# Patient Record
Sex: Female | Born: 1970 | Race: White | Hispanic: No | Marital: Single | Smoking: Former smoker
Health system: Southern US, Community
[De-identification: ages and names within clinical notes are randomized; demographics above are authoritative.]

## PROBLEM LIST (undated history)

## (undated) DIAGNOSIS — M2550 Pain in unspecified joint: Secondary | ICD-10-CM

## (undated) DIAGNOSIS — F50819 Binge eating disorder, unspecified: Secondary | ICD-10-CM

## (undated) DIAGNOSIS — M1990 Unspecified osteoarthritis, unspecified site: Secondary | ICD-10-CM

## (undated) DIAGNOSIS — R87619 Unspecified abnormal cytological findings in specimens from cervix uteri: Secondary | ICD-10-CM

## (undated) DIAGNOSIS — I10 Essential (primary) hypertension: Secondary | ICD-10-CM

## (undated) DIAGNOSIS — K439 Ventral hernia without obstruction or gangrene: Secondary | ICD-10-CM

## (undated) DIAGNOSIS — K603 Anal fistula: Secondary | ICD-10-CM

## (undated) DIAGNOSIS — N393 Stress incontinence (female) (male): Secondary | ICD-10-CM

## (undated) DIAGNOSIS — E782 Mixed hyperlipidemia: Secondary | ICD-10-CM

## (undated) DIAGNOSIS — G4733 Obstructive sleep apnea (adult) (pediatric): Secondary | ICD-10-CM

## (undated) DIAGNOSIS — Z9221 Personal history of antineoplastic chemotherapy: Secondary | ICD-10-CM

## (undated) DIAGNOSIS — E538 Deficiency of other specified B group vitamins: Secondary | ICD-10-CM

## (undated) DIAGNOSIS — G43909 Migraine, unspecified, not intractable, without status migrainosus: Secondary | ICD-10-CM

## (undated) DIAGNOSIS — Z91018 Allergy to other foods: Secondary | ICD-10-CM

## (undated) DIAGNOSIS — F319 Bipolar disorder, unspecified: Secondary | ICD-10-CM

## (undated) DIAGNOSIS — F988 Other specified behavioral and emotional disorders with onset usually occurring in childhood and adolescence: Secondary | ICD-10-CM

## (undated) DIAGNOSIS — N809 Endometriosis, unspecified: Secondary | ICD-10-CM

## (undated) DIAGNOSIS — R5383 Other fatigue: Secondary | ICD-10-CM

## (undated) DIAGNOSIS — K649 Unspecified hemorrhoids: Secondary | ICD-10-CM

## (undated) DIAGNOSIS — A64 Unspecified sexually transmitted disease: Secondary | ICD-10-CM

## (undated) DIAGNOSIS — E559 Vitamin D deficiency, unspecified: Secondary | ICD-10-CM

## (undated) DIAGNOSIS — R911 Solitary pulmonary nodule: Secondary | ICD-10-CM

## (undated) DIAGNOSIS — F5081 Binge eating disorder: Secondary | ICD-10-CM

## (undated) DIAGNOSIS — M7989 Other specified soft tissue disorders: Secondary | ICD-10-CM

## (undated) DIAGNOSIS — F411 Generalized anxiety disorder: Secondary | ICD-10-CM

## (undated) DIAGNOSIS — C539 Malignant neoplasm of cervix uteri, unspecified: Secondary | ICD-10-CM

## (undated) DIAGNOSIS — K580 Irritable bowel syndrome with diarrhea: Secondary | ICD-10-CM

## (undated) DIAGNOSIS — Z86018 Personal history of other benign neoplasm: Secondary | ICD-10-CM

## (undated) DIAGNOSIS — K589 Irritable bowel syndrome without diarrhea: Secondary | ICD-10-CM

## (undated) DIAGNOSIS — K529 Noninfective gastroenteritis and colitis, unspecified: Secondary | ICD-10-CM

## (undated) DIAGNOSIS — D219 Benign neoplasm of connective and other soft tissue, unspecified: Secondary | ICD-10-CM

## (undated) DIAGNOSIS — K760 Fatty (change of) liver, not elsewhere classified: Secondary | ICD-10-CM

## (undated) DIAGNOSIS — M549 Dorsalgia, unspecified: Secondary | ICD-10-CM

## (undated) DIAGNOSIS — E739 Lactose intolerance, unspecified: Secondary | ICD-10-CM

## (undated) DIAGNOSIS — F909 Attention-deficit hyperactivity disorder, unspecified type: Secondary | ICD-10-CM

## (undated) DIAGNOSIS — E785 Hyperlipidemia, unspecified: Secondary | ICD-10-CM

## (undated) DIAGNOSIS — Z8719 Personal history of other diseases of the digestive system: Secondary | ICD-10-CM

## (undated) DIAGNOSIS — R0602 Shortness of breath: Secondary | ICD-10-CM

## (undated) DIAGNOSIS — R32 Unspecified urinary incontinence: Secondary | ICD-10-CM

## (undated) DIAGNOSIS — K219 Gastro-esophageal reflux disease without esophagitis: Secondary | ICD-10-CM

## (undated) DIAGNOSIS — Z9989 Dependence on other enabling machines and devices: Secondary | ICD-10-CM

## (undated) DIAGNOSIS — F419 Anxiety disorder, unspecified: Secondary | ICD-10-CM

## (undated) DIAGNOSIS — F329 Major depressive disorder, single episode, unspecified: Secondary | ICD-10-CM

## (undated) DIAGNOSIS — E7800 Pure hypercholesterolemia, unspecified: Secondary | ICD-10-CM

## (undated) DIAGNOSIS — Z923 Personal history of irradiation: Secondary | ICD-10-CM

## (undated) DIAGNOSIS — R7303 Prediabetes: Secondary | ICD-10-CM

## (undated) HISTORY — DX: Pain in unspecified joint: M25.50

## (undated) HISTORY — DX: Attention-deficit hyperactivity disorder, unspecified type: F90.9

## (undated) HISTORY — PX: OTHER SURGICAL HISTORY: SHX169

## (undated) HISTORY — DX: Shortness of breath: R06.02

## (undated) HISTORY — PX: EXCISION OF BREAST BIOPSY: SHX5822

## (undated) HISTORY — DX: Other specified soft tissue disorders: M79.89

## (undated) HISTORY — DX: Pure hypercholesterolemia, unspecified: E78.00

## (undated) HISTORY — DX: Bipolar disorder, unspecified: F31.9

## (undated) HISTORY — PX: HERNIA REPAIR: SHX51

## (undated) HISTORY — PX: COLONOSCOPY: SHX174

## (undated) HISTORY — DX: Prediabetes: R73.03

## (undated) HISTORY — DX: Deficiency of other specified B group vitamins: E53.8

## (undated) HISTORY — PX: COLPOSCOPY: SHX161

## (undated) HISTORY — PX: WISDOM TOOTH EXTRACTION: SHX21

## (undated) HISTORY — DX: Essential (primary) hypertension: I10

## (undated) HISTORY — DX: Other specified behavioral and emotional disorders with onset usually occurring in childhood and adolescence: F98.8

## (undated) HISTORY — DX: Unspecified sexually transmitted disease: A64

## (undated) HISTORY — DX: Vitamin D deficiency, unspecified: E55.9

## (undated) HISTORY — DX: Fatty (change of) liver, not elsewhere classified: K76.0

## (undated) HISTORY — DX: Endometriosis, unspecified: N80.9

## (undated) HISTORY — DX: Dorsalgia, unspecified: M54.9

## (undated) HISTORY — DX: Unspecified urinary incontinence: R32

## (undated) HISTORY — DX: Allergy to other foods: Z91.018

## (undated) HISTORY — DX: Lactose intolerance, unspecified: E73.9

## (undated) HISTORY — DX: Benign neoplasm of connective and other soft tissue, unspecified: D21.9

## (undated) HISTORY — DX: Unspecified abnormal cytological findings in specimens from cervix uteri: R87.619

## (undated) HISTORY — PX: TOTAL ABDOMINAL HYSTERECTOMY W/ BILATERAL SALPINGOOPHORECTOMY: SHX83

---

## 2008-11-25 HISTORY — PX: BREAST SURGERY: SHX581

## 2010-08-31 DIAGNOSIS — D249 Benign neoplasm of unspecified breast: Secondary | ICD-10-CM | POA: Insufficient documentation

## 2011-10-07 DIAGNOSIS — G43909 Migraine, unspecified, not intractable, without status migrainosus: Secondary | ICD-10-CM | POA: Insufficient documentation

## 2011-10-07 DIAGNOSIS — N943 Premenstrual tension syndrome: Secondary | ICD-10-CM | POA: Insufficient documentation

## 2011-11-28 DIAGNOSIS — E785 Hyperlipidemia, unspecified: Secondary | ICD-10-CM | POA: Insufficient documentation

## 2012-05-16 DIAGNOSIS — R87619 Unspecified abnormal cytological findings in specimens from cervix uteri: Secondary | ICD-10-CM

## 2012-05-16 HISTORY — DX: Unspecified abnormal cytological findings in specimens from cervix uteri: R87.619

## 2013-04-15 DIAGNOSIS — Z8541 Personal history of malignant neoplasm of cervix uteri: Secondary | ICD-10-CM

## 2013-04-15 HISTORY — DX: Personal history of malignant neoplasm of cervix uteri: Z85.41

## 2013-05-16 DIAGNOSIS — C539 Malignant neoplasm of cervix uteri, unspecified: Secondary | ICD-10-CM

## 2013-05-16 HISTORY — DX: Malignant neoplasm of cervix uteri, unspecified: C53.9

## 2013-05-31 DIAGNOSIS — C539 Malignant neoplasm of cervix uteri, unspecified: Secondary | ICD-10-CM | POA: Insufficient documentation

## 2013-06-11 HISTORY — PX: TOTAL ABDOMINAL HYSTERECTOMY W/ BILATERAL SALPINGOOPHORECTOMY: SHX83

## 2013-06-11 HISTORY — PX: ABDOMINAL HYSTERECTOMY: SHX81

## 2013-12-12 ENCOUNTER — Encounter: Payer: BC Managed Care – PPO | Attending: Specialist | Admitting: *Deleted

## 2013-12-12 ENCOUNTER — Encounter: Payer: Self-pay | Admitting: *Deleted

## 2013-12-12 VITALS — Ht 64.0 in | Wt 202.5 lb

## 2013-12-12 DIAGNOSIS — K432 Incisional hernia without obstruction or gangrene: Secondary | ICD-10-CM | POA: Insufficient documentation

## 2013-12-12 DIAGNOSIS — E669 Obesity, unspecified: Secondary | ICD-10-CM | POA: Diagnosis present

## 2013-12-12 DIAGNOSIS — Z713 Dietary counseling and surveillance: Secondary | ICD-10-CM | POA: Diagnosis present

## 2013-12-12 DIAGNOSIS — Z6834 Body mass index (BMI) 34.0-34.9, adult: Secondary | ICD-10-CM | POA: Diagnosis not present

## 2013-12-12 NOTE — Progress Notes (Signed)
Medical Nutrition Therapy:  Appt start time: 1500 end time:  1600.  Assessment:  Patient here today for weight management counseling. Patient reports that she has gained about 40 pounds in the last 5 years and has not been able to lose any weight. She is using My Fitness Pal to track diet, and is fairly active, walking most days and swimming several times weekly. She has tried Weight Watchers and other supervised weight loss programs, but these have not worked. She has a history of cervical cancer with hysterectomy. She was recently tested for food allergies, and found to be allergic and/or intolerant to many foods, including dairy, gluten, nuts, eggs, corn, as well as other fruits, vegetables, and seasonings. She has a history of IBS. Patient verbalizes frustration with inability to lose weight. We discussed readjusting expectations for weight loss in order to celebrate any progress and not get discouraged.   MEDICATIONS: See list   DIETARY INTAKE:   Usual eating pattern includes 3 meals and 1-2 snacks per day.  24-hr recall:  B ( AM): Protein shake (NP [employer]), coconut milk, coconut oil, fruit, flaxseed  Unable to complete full diet assessment due to complex discussion of food allergies  Usual physical activity: Walking daily, swimming 1-2 times weekly  Estimated energy needs: 1200 calories 150 g carbohydrates 75 g protein 40 g fat  Progress Towards Goal(s):  In progress.   Nutritional Diagnosis:  [place]-3.3 Overweight/obesity As related to multiple medical problems.  As evidenced by BMI >30.    Intervention:  Nutrition counseling. We discussed strategies for weight loss, including balancing nutrients (carbs, protein, fat), portion control, healthy snacks, and exercise. We also discussed her food allergies and food options.   Goals:  1. 0.5-1 pound weight loss per week.  2. Eliminate food allergens from diet. 3. Balance nutrients at meals (1-2 starches, 3 oz protein, 1 cup  vegetables) 4. Choose healthy snacks (fruit, vegetables, bean dip, soy yogurt, edamame)  Handouts given during visit include:  Weight loss tips  Monitoring/Evaluation:  Dietary intake, exercise, and body weight in 6 week(s).

## 2014-01-10 HISTORY — PX: OTHER SURGICAL HISTORY: SHX169

## 2014-01-11 DIAGNOSIS — Z8719 Personal history of other diseases of the digestive system: Secondary | ICD-10-CM | POA: Insufficient documentation

## 2014-02-24 ENCOUNTER — Other Ambulatory Visit: Payer: Self-pay | Admitting: Internal Medicine

## 2014-02-24 ENCOUNTER — Ambulatory Visit
Admission: RE | Admit: 2014-02-24 | Discharge: 2014-02-24 | Disposition: A | Discharge status: Home / Self Care | Payer: BC Managed Care – PPO | Source: Ambulatory Visit | Attending: Internal Medicine | Admitting: Internal Medicine

## 2014-02-24 DIAGNOSIS — S022XXA Fracture of nasal bones, initial encounter for closed fracture: Secondary | ICD-10-CM

## 2014-10-17 ENCOUNTER — Encounter (HOSPITAL_COMMUNITY): Payer: Self-pay | Admitting: *Deleted

## 2014-10-17 ENCOUNTER — Emergency Department (HOSPITAL_COMMUNITY)
Admission: EM | Admit: 2014-10-17 | Discharge: 2014-10-17 | Disposition: A | Discharge status: Home / Self Care | Payer: BLUE CROSS/BLUE SHIELD | Attending: Emergency Medicine | Admitting: Emergency Medicine

## 2014-10-17 DIAGNOSIS — Z8639 Personal history of other endocrine, nutritional and metabolic disease: Secondary | ICD-10-CM | POA: Insufficient documentation

## 2014-10-17 DIAGNOSIS — Z880 Allergy status to penicillin: Secondary | ICD-10-CM | POA: Insufficient documentation

## 2014-10-17 DIAGNOSIS — Z8541 Personal history of malignant neoplasm of cervix uteri: Secondary | ICD-10-CM | POA: Diagnosis not present

## 2014-10-17 DIAGNOSIS — K644 Residual hemorrhoidal skin tags: Secondary | ICD-10-CM | POA: Diagnosis not present

## 2014-10-17 DIAGNOSIS — Z79899 Other long term (current) drug therapy: Secondary | ICD-10-CM | POA: Insufficient documentation

## 2014-10-17 DIAGNOSIS — K6289 Other specified diseases of anus and rectum: Secondary | ICD-10-CM | POA: Diagnosis present

## 2014-10-17 HISTORY — DX: Malignant neoplasm of cervix uteri, unspecified: C53.9

## 2014-10-17 LAB — CBC WITH DIFFERENTIAL/PLATELET
BASOS ABS: 0 10*3/uL (ref 0.0–0.1)
Basophils Relative: 0 % (ref 0–1)
EOS ABS: 0.1 10*3/uL (ref 0.0–0.7)
Eosinophils Relative: 1 % (ref 0–5)
HEMATOCRIT: 38.7 % (ref 36.0–46.0)
HEMOGLOBIN: 13.1 g/dL (ref 12.0–15.0)
Lymphocytes Relative: 13 % (ref 12–46)
Lymphs Abs: 1 10*3/uL (ref 0.7–4.0)
MCH: 30 pg (ref 26.0–34.0)
MCHC: 33.9 g/dL (ref 30.0–36.0)
MCV: 88.8 fL (ref 78.0–100.0)
MONO ABS: 0.9 10*3/uL (ref 0.1–1.0)
MONOS PCT: 11 % (ref 3–12)
Neutro Abs: 5.9 10*3/uL (ref 1.7–7.7)
Neutrophils Relative %: 75 % (ref 43–77)
Platelets: 225 10*3/uL (ref 150–400)
RBC: 4.36 MIL/uL (ref 3.87–5.11)
RDW: 13.4 % (ref 11.5–15.5)
WBC: 8 10*3/uL (ref 4.0–10.5)

## 2014-10-17 MED ORDER — METFORMIN HCL 500 MG PO TABS: 500.0000 mg | ORAL_TABLET | Freq: Every day | ORAL | Status: DC

## 2014-10-17 MED ORDER — HYDROCORTISONE 2.5 % RE CREA: TOPICAL_CREAM | Freq: Two times a day (BID) | RECTAL | Status: DC

## 2014-10-17 MED ORDER — HYDROCORTISONE 2.5 % RE CREA
TOPICAL_CREAM | Freq: Two times a day (BID) | RECTAL | Status: DC
  Administered 2014-10-17: 1 via RECTAL
  Filled 2014-10-17: qty 28.35

## 2014-10-17 NOTE — ED Notes (Signed)
Pt states she has hemorrhoids she noticed Wednesday.  States she is bleeding frank blood in toilet and medium blood on toilet paper.  Pt states she is having pain 6/10.  She states she can't sit down and hemorrhoid has grown 3 sizes since Wendsday.  Vital stable.

## 2014-10-17 NOTE — Discharge Instructions (Signed)
- Use Anusol cream twice a day - You can get a donut cushion at drug stores such as [facility], [facility] or [facility] - Referral placed to general surgery. You should hear from their office within the next week.  - Use stool softeners such as Miralax or Dulcolax    Hemorrhoids Hemorrhoids are swollen veins around the rectum or anus. There are two types of hemorrhoids:   Internal hemorrhoids. These occur in the veins just inside the rectum. They may poke through to the outside and become irritated and painful.  External hemorrhoids. These occur in the veins outside the anus and can be felt as a painful swelling or hard lump near the anus. CAUSES  Pregnancy.   Obesity.   Constipation or diarrhea.   Straining to have a bowel movement.   Sitting for long periods on the toilet.  Heavy lifting or other activity that caused you to strain.  Anal intercourse. SYMPTOMS   Pain.   Anal itching or irritation.   Rectal bleeding.   Fecal leakage.   Anal swelling.   One or more lumps around the anus.  DIAGNOSIS  Your caregiver may be able to diagnose hemorrhoids by visual examination. Other examinations or tests that may be performed include:   Examination of the rectal area with a gloved hand (digital rectal exam).   Examination of anal canal using a small tube (scope).   A blood test if you have lost a significant amount of blood.  A test to look inside the colon (sigmoidoscopy or colonoscopy). TREATMENT Most hemorrhoids can be treated at home. However, if symptoms do not seem to be getting better or if you have a lot of rectal bleeding, your caregiver may perform a procedure to help make the hemorrhoids get smaller or remove them completely. Possible treatments include:   Placing a rubber band at the base of the hemorrhoid to cut off the circulation (rubber band ligation).   Injecting a chemical to shrink the hemorrhoid (sclerotherapy).   Using a tool to burn the  hemorrhoid (infrared light therapy).   Surgically removing the hemorrhoid (hemorrhoidectomy).   Stapling the hemorrhoid to block blood flow to the tissue (hemorrhoid stapling).  HOME CARE INSTRUCTIONS   Eat foods with fiber, such as whole grains, beans, nuts, fruits, and vegetables. Ask your doctor about taking products with added fiber in them (fibersupplements).  Increase fluid intake. Drink enough water and fluids to keep your urine clear or pale yellow.   Exercise regularly.   Go to the bathroom when you have the urge to have a bowel movement. Do not wait.   Avoid straining to have bowel movements.   Keep the anal area dry and clean. Use wet toilet paper or moist towelettes after a bowel movement.   Medicated creams and suppositories may be used or applied as directed.   Only take over-the-counter or prescription medicines as directed by your caregiver.   Take warm sitz baths for 15-20 minutes, 3-4 times a day to ease pain and discomfort.   Place ice packs on the hemorrhoids if they are tender and swollen. Using ice packs between sitz baths may be helpful.   Put ice in a plastic bag.   Place a towel between your skin and the bag.   Leave the ice on for 15-20 minutes, 3-4 times a day.   Do not use a donut-shaped pillow or sit on the toilet for long periods. This increases blood pooling and pain.  SEEK MEDICAL CARE IF:  You have increasing pain and swelling that is not controlled by treatment or medicine.  You have uncontrolled bleeding.  You have difficulty or you are unable to have a bowel movement.  You have pain or inflammation outside the area of the hemorrhoids. MAKE SURE YOU:  Understand these instructions.  Will watch your condition.  Will get help right away if you are not doing well or get worse. Document Released: 04/29/2000 Document Revised: 04/18/2012 Document Reviewed: 03/06/2012 [facility] Patient Information 2015 [place], [place].  This information is not intended to replace advice given to you by your health care provider. Make sure you discuss any questions you have with your health care provider.

## 2014-10-17 NOTE — ED Provider Notes (Signed)
CSN: [id number]     Arrival date & time 10/17/14  0808 History   First MD Initiated Contact with Patient 10/17/14 0818     Chief Complaint  Patient presents with  . Hemorrhoids   HPI Ms. Mast is a 44yo woman with PMHx of PCOS, cervical cancer s/p radial hysterectomy, radiation, and chemo, and external hemorrhoids who presents to the ED with rectal pain. Patient states she has noticed for the last week increased pain in her rectum and that her external hemorrhoid has grown larger. She reports bright red blood in her stool and with wiping. She also notes seeing dark tarry stool a few times last week. She notes increased BMs since she was recently started on Metformin and Cymbalta. She reports she has been unable to sit down due to the pain. She reports feeling dizzy and lightheaded but that this has been ongoing for several months. She denies abdominal pain, nausea, and vomiting. She reports only taking excedrin for migraines, denies excessive NSAID use.    Past Medical History  Diagnosis Date  . Cervical cancer 2015   History reviewed. No pertinent past surgical history. No family history on file. History  Substance Use Topics  . Smoking status: Not on file  . Smokeless tobacco: Not on file  . Alcohol Use: Not on file   OB History    No data available     Review of Systems General: Denies fever, chills, night sweats, changes in weight, changes in appetite HEENT: Denies ear pain, changes in vision, rhinorrhea, sore throat CV: Denies CP, palpitations, SOB, orthopnea Pulm: Denies SOB, cough, wheezing GI: See HPI GU: Denies dysuria, hematuria, frequency Msk: Denies muscle cramps, joint pains Neuro: Denies weakness, numbness, tingling Skin: Denies rashes, bruising   Allergies  Penicillins and Sulfa antibiotics  Home Medications   Prior to Admission medications   Medication Sig Start Date End Date Taking? Authorizing Provider  DULoxetine (CYMBALTA) 30 MG capsule Take 30 mg by  mouth 2 (two) times daily. 09/19/14   Historical Provider, MD  metFORMIN (GLUCOPHAGE) 500 MG tablet Take 1,000 mg by mouth 2 (two) times daily. 09/19/14   Historical Provider, MD   Pulse 99  Temp(Src) 98.5 F (36.9 C) (Oral)  Resp 18  Ht 5\' 4"  (1.626 m)  Wt 209 lb (94.802 kg)  BMI 35.86 kg/m2  SpO2 94% Physical Exam General: middle aged woman sitting up in bed, NAD HEENT: [place]/AT, EOMI, sclera anicteric, mucus membranes moist CV: RRR, no m/g/r Pulm: CTA bilaterally, breaths non-labored Abd: BS+, soft, non-tender, non-distended  Rectal: External hemorrhoid on the left noted that does not appear thrombosed. Rectal exam did not reveal stool in the vault, no blood. Significant tenderness to palpation.  Ext: warm, no edema Neuro: alert and oriented x 3, no focal deficits  ED Course  Procedures (including critical care time)  Medications  hydrocortisone (ANUSOL-HC) 2.5 % rectal cream (1 application Rectal Given 10/17/14 0936)   Labs Review Labs Reviewed  CBC WITH DIFFERENTIAL/PLATELET  Hbg 13.1  Imaging Review No results found.   EKG Interpretation None      MDM   Final diagnoses:  External hemorrhoids without complication    30VQ woman with hx of external hemorrhoids presenting with worsening rectal pain due to hemorrhoids and noting BRBPR and a few dark stools in the past week. She does have an external hemorrhoid on exam that does not appear thrombosed. Will check CBC since she reported blood loss and start anusol cream.   CBC  with normal WBC and hemoglobin.   Will place referral for outpatient surgery and decrease Metformin to 500 mg daily to help decrease frequency of bowel movements. Patient given Anusol to help with pain. Patient instructed to return to the ED if her pain becomes unbearable or if she has excessive bleeding from her rectum.  Albin Felling, MD IMTS PGY-1 Pager: [phone]      Juliet Rude, MD 10/17/14 [place], MD 10/18/14 [phone]

## 2014-10-21 ENCOUNTER — Encounter (HOSPITAL_COMMUNITY): Payer: Self-pay | Admitting: *Deleted

## 2014-10-21 ENCOUNTER — Emergency Department (HOSPITAL_COMMUNITY): Payer: BLUE CROSS/BLUE SHIELD

## 2014-10-21 ENCOUNTER — Emergency Department (HOSPITAL_COMMUNITY)
Admission: EM | Admit: 2014-10-21 | Discharge: 2014-10-21 | Disposition: A | Discharge status: Home / Self Care | Payer: BLUE CROSS/BLUE SHIELD | Attending: Emergency Medicine | Admitting: Emergency Medicine

## 2014-10-21 DIAGNOSIS — Z8541 Personal history of malignant neoplasm of cervix uteri: Secondary | ICD-10-CM | POA: Diagnosis not present

## 2014-10-21 DIAGNOSIS — Z880 Allergy status to penicillin: Secondary | ICD-10-CM | POA: Insufficient documentation

## 2014-10-21 DIAGNOSIS — K611 Rectal abscess: Secondary | ICD-10-CM

## 2014-10-21 DIAGNOSIS — Z87891 Personal history of nicotine dependence: Secondary | ICD-10-CM | POA: Insufficient documentation

## 2014-10-21 DIAGNOSIS — K921 Melena: Secondary | ICD-10-CM | POA: Diagnosis not present

## 2014-10-21 DIAGNOSIS — K6289 Other specified diseases of anus and rectum: Secondary | ICD-10-CM | POA: Diagnosis present

## 2014-10-21 DIAGNOSIS — R110 Nausea: Secondary | ICD-10-CM

## 2014-10-21 DIAGNOSIS — L03317 Cellulitis of buttock: Secondary | ICD-10-CM

## 2014-10-21 DIAGNOSIS — Z79899 Other long term (current) drug therapy: Secondary | ICD-10-CM | POA: Diagnosis not present

## 2014-10-21 DIAGNOSIS — K644 Residual hemorrhoidal skin tags: Secondary | ICD-10-CM | POA: Diagnosis not present

## 2014-10-21 LAB — CBC WITH DIFFERENTIAL/PLATELET
BASOS ABS: 0 10*3/uL (ref 0.0–0.1)
Basophils Relative: 0 % (ref 0–1)
Eosinophils Absolute: 0.1 10*3/uL (ref 0.0–0.7)
Eosinophils Relative: 1 % (ref 0–5)
HCT: 37.4 % (ref 36.0–46.0)
HEMOGLOBIN: 12.7 g/dL (ref 12.0–15.0)
LYMPHS ABS: 0.9 10*3/uL (ref 0.7–4.0)
Lymphocytes Relative: 7 % — ABNORMAL LOW (ref 12–46)
MCH: 30.2 pg (ref 26.0–34.0)
MCHC: 34 g/dL (ref 30.0–36.0)
MCV: 88.8 fL (ref 78.0–100.0)
MONOS PCT: 6 % (ref 3–12)
Monocytes Absolute: 0.8 10*3/uL (ref 0.1–1.0)
NEUTROS PCT: 86 % — AB (ref 43–77)
Neutro Abs: 11.5 10*3/uL — ABNORMAL HIGH (ref 1.7–7.7)
Platelets: 323 10*3/uL (ref 150–400)
RBC: 4.21 MIL/uL (ref 3.87–5.11)
RDW: 13.4 % (ref 11.5–15.5)
WBC: 13.3 10*3/uL — AB (ref 4.0–10.5)

## 2014-10-21 LAB — URINALYSIS, ROUTINE W REFLEX MICROSCOPIC
Bilirubin Urine: NEGATIVE
GLUCOSE, UA: NEGATIVE mg/dL
KETONES UR: NEGATIVE mg/dL
Leukocytes, UA: NEGATIVE
Nitrite: NEGATIVE
Protein, ur: NEGATIVE mg/dL
Specific Gravity, Urine: 1.031 — ABNORMAL HIGH (ref 1.005–1.030)
Urobilinogen, UA: 0.2 mg/dL (ref 0.0–1.0)
pH: 6 (ref 5.0–8.0)

## 2014-10-21 LAB — COMPREHENSIVE METABOLIC PANEL
ALT: 26 U/L (ref 14–54)
AST: 19 U/L (ref 15–41)
Albumin: 3.6 g/dL (ref 3.5–5.0)
Alkaline Phosphatase: 134 U/L — ABNORMAL HIGH (ref 38–126)
Anion gap: 12 (ref 5–15)
BILIRUBIN TOTAL: 1 mg/dL (ref 0.3–1.2)
BUN: 15 mg/dL (ref 6–20)
CO2: 23 mmol/L (ref 22–32)
Calcium: 9 mg/dL (ref 8.9–10.3)
Chloride: 102 mmol/L (ref 101–111)
Creatinine, Ser: 0.75 mg/dL (ref 0.44–1.00)
Glucose, Bld: 101 mg/dL — ABNORMAL HIGH (ref 65–99)
Potassium: 4.1 mmol/L (ref 3.5–5.1)
Sodium: 137 mmol/L (ref 135–145)
Total Protein: 7.6 g/dL (ref 6.5–8.1)

## 2014-10-21 LAB — URINE MICROSCOPIC-ADD ON

## 2014-10-21 LAB — POC OCCULT BLOOD, ED: FECAL OCCULT BLD: NEGATIVE

## 2014-10-21 MED ORDER — FENTANYL CITRATE (PF) 100 MCG/2ML IJ SOLN
INTRAMUSCULAR | Status: AC
  Filled 2014-10-21: qty 2

## 2014-10-21 MED ORDER — MORPHINE SULFATE 4 MG/ML IJ SOLN
4.0000 mg | Freq: Once | INTRAMUSCULAR | Status: AC
Start: 2014-10-21 — End: 2014-10-21
  Administered 2014-10-21: 4 mg via INTRAVENOUS
  Filled 2014-10-21: qty 1

## 2014-10-21 MED ORDER — LIDOCAINE HCL 2 % IJ SOLN
20.0000 mL | Freq: Once | INTRAMUSCULAR | Status: AC
  Administered 2014-10-21: 400 mg via INTRADERMAL
  Filled 2014-10-21: qty 20

## 2014-10-21 MED ORDER — MORPHINE SULFATE 4 MG/ML IJ SOLN
4.0000 mg | Freq: Once | INTRAMUSCULAR | Status: AC
  Administered 2014-10-21: 4 mg via INTRAVENOUS
  Filled 2014-10-21: qty 1

## 2014-10-21 MED ORDER — CLINDAMYCIN PHOSPHATE 600 MG/50ML IV SOLN
600.0000 mg | Freq: Once | INTRAVENOUS | Status: AC
  Administered 2014-10-21: 600 mg via INTRAVENOUS
  Filled 2014-10-21: qty 50

## 2014-10-21 MED ORDER — IOHEXOL 300 MG/ML  SOLN
80.0000 mL | Freq: Once | INTRAMUSCULAR | Status: AC | PRN
  Administered 2014-10-21: 80 mL via INTRAVENOUS

## 2014-10-21 MED ORDER — ONDANSETRON HCL 4 MG/2ML IJ SOLN
4.0000 mg | Freq: Once | INTRAMUSCULAR | Status: AC
  Administered 2014-10-21: 4 mg via INTRAVENOUS
  Filled 2014-10-21: qty 2

## 2014-10-21 MED ORDER — ALPRAZOLAM 0.5 MG PO TABS
0.5000 mg | ORAL_TABLET | Freq: Once | ORAL | Status: AC
  Administered 2014-10-21: 0.5 mg via ORAL
  Filled 2014-10-21: qty 1

## 2014-10-21 MED ORDER — PROMETHAZINE HCL 25 MG PO TABS: 25.0000 mg | ORAL_TABLET | Freq: Four times a day (QID) | ORAL | Status: DC | PRN

## 2014-10-21 MED ORDER — FENTANYL CITRATE (PF) 100 MCG/2ML IJ SOLN
50.0000 ug | Freq: Once | INTRAMUSCULAR | Status: AC
  Administered 2014-10-21: 50 ug via NASAL

## 2014-10-21 MED ORDER — DOCUSATE SODIUM 100 MG PO CAPS: 100.0000 mg | ORAL_CAPSULE | Freq: Two times a day (BID) | ORAL | Status: DC | PRN

## 2014-10-21 MED ORDER — HYDROCODONE-ACETAMINOPHEN 5-325 MG PO TABS: 1.0000 | ORAL_TABLET | Freq: Four times a day (QID) | ORAL | Status: DC | PRN

## 2014-10-21 MED ORDER — CLINDAMYCIN HCL 300 MG PO CAPS: 300.0000 mg | ORAL_CAPSULE | Freq: Four times a day (QID) | ORAL | Status: DC

## 2014-10-21 NOTE — ED Notes (Signed)
Pt ambulated to restroom with steady gait.  Standby assist.  

## 2014-10-21 NOTE — ED Notes (Signed)
Family at bedside. 

## 2014-10-21 NOTE — ED Notes (Signed)
Pt is here with hemorrhoids and states she has a mass from rectum to top left side. Pt is reporting bleeding.

## 2014-10-21 NOTE — Discharge Instructions (Signed)
Keep wound clean and dry. Perform sitz baths 4 times daily. Take antibiotic until it is finished. Take ibuprofen and norco as directed, as needed for pain but do not drive or operate machinery with pain medication use. Use phenergan as needed for nausea, and colace for constipation. Followup with [name] doctor in 2 days for wound recheck and packing removal. Monitor area for signs of infection to include, but not limited to: increasing pain, redness, drainage/pus, or swelling. Return to emergency department for emergent changing or worsening symptoms.   Peri-Rectal Abscess Your caregiver has diagnosed you as having a peri-rectal abscess. This is an infected area near the rectum that is filled with pus. If the abscess is near the surface of the skin, your caregiver may open (incise) the area and drain the pus. HOME CARE INSTRUCTIONS   If your abscess was opened up and drained. A small piece of gauze may be placed in the opening so that it can drain. Do not remove the gauze unless directed by your caregiver.  A loose dressing may be placed over the abscess site. Change the dressing as often as necessary to keep it clean and dry.  After the drain is removed, the area may be washed with a gentle antiseptic (soap) four times per day.  A warm sitz bath, warm packs or heating pad may be used for pain relief, taking care not to burn yourself.  Return for a wound check in 1 day or as directed.  An "inflatable doughnut" may be used for sitting with added comfort. These can be purchased at a drugstore or medical supply house.  To reduce pain and straining with bowel movements, eat a high fiber diet with plenty of fruits and vegetables. Use stool softeners as recommended by your caregiver. This is especially important if narcotic type pain medications were prescribed as these may cause marked constipation.  Only take over-the-counter or prescription medicines for pain,  discomfort, or fever as directed by your caregiver. SEEK IMMEDIATE MEDICAL CARE IF:   You have increasing pain that is not controlled by medication.  There is increased inflammation (redness), swelling, bleeding, or drainage from the area.  An oral temperature above 102 F (38.9 C) develops.  You develop chills or generalized malaise (feel lethargic or feel "washed out").  You develop any new symptoms (problems) you feel may be related to your present problem. Document Released: 04/29/2000 Document Revised: 07/25/2011 Document Reviewed: 04/29/2008 [facility] Patient Information 2015 [place], [place]. This information is not intended to replace advice given to you by your health care provider. Make sure you discuss any questions you have with your health care provider.  Cellulitis Cellulitis is an infection of the skin and the tissue under the skin. The infected area is usually red and tender. This happens most often in the arms and lower legs. HOME CARE   Take your antibiotic medicine as told. Finish the medicine even if you start to feel better.  Keep the infected arm or leg raised (elevated).  Put a warm cloth on the area up to 4 times per day.  Only take medicines as told by your doctor.  Keep all doctor visits as told. GET HELP IF:  You see red streaks on the skin coming from the infected area.  Your red area gets bigger or turns a dark color.  Your bone or joint under the infected area is painful after the skin heals.  Your infection comes back in the same area  or different area.  You have a puffy (swollen) bump in the infected area.  You have new symptoms.  You have a fever. GET HELP RIGHT AWAY IF:   You feel very sleepy.  You throw up (vomit) or have watery poop (diarrhea).  You feel sick and have muscle aches and pains. MAKE SURE YOU:   Understand these instructions.  Will watch your condition.  Will get help right away if you are not doing well or get  worse. Document Released: 10/19/2007 Document Revised: 09/16/2013 Document Reviewed: 07/18/2011 [facility] Patient Information 2015 [place], [place]. This information is not intended to replace advice given to you by your health care provider. Make sure you discuss any questions you have with your health care provider.  Nausea, Adult Nausea means you feel sick to your stomach or need to throw up (vomit). It may be a sign of a more serious problem. If nausea gets worse, you may throw up. If you throw up a lot, you may lose too much body fluid (dehydration). HOME CARE   Get plenty of rest.  Ask your doctor how to replace body fluid losses (rehydrate).  Eat small amounts of food. Sip liquids more often.  Take all medicines as told by your doctor. GET HELP RIGHT AWAY IF:  You have a fever.  You pass out (faint).  You keep throwing up or have blood in your throw up.  You are very weak, have dry lips or a dry mouth, or you are very thirsty (dehydrated).  You have dark or bloody poop (stool).  You have very bad chest or belly (abdominal) pain.  You do not get better after 2 days, or you get worse.  You have a headache. MAKE SURE YOU:  Understand these instructions.  Will watch your condition.  Will get help right away if you are not doing well or get worse. Document Released: 04/21/2011 Document Revised: 07/25/2011 Document Reviewed: 04/21/2011 [facility] Patient Information 2015 [place], [place]. This information is not intended to replace advice given to you by your health care provider. Make sure you discuss any questions you have with your health care provider. Emergency Department Resource Guide 1) Find a Doctor and Pay Out of Pocket Although you won't have to find out who is covered by your insurance plan, it is a good idea to ask around and get recommendations. You will then need to call the office and see if the doctor you have chosen will accept you as a new patient and what  types of options they offer for patients who are self-pay. Some doctors offer discounts or will set up payment plans for their patients who do not have insurance, but you will need to ask so you aren't surprised when you get to your appointment.  2) Contact Your Local Health Department Not all health departments have doctors that can see patients for sick visits, but many do, so it is worth a call to see if yours does. If you don't know where your local health department is, you can check in your phone book. The CDC also has a tool to help you locate your state's health department, and many state websites also have listings of all of their local health departments.  3) Find a [facility] If your illness is not likely to be very severe or complicated, you may want to try a walk in clinic. These are popping up all over the country in pharmacies, drugstores, and shopping centers. They're usually staffed by nurse  practitioners or physician assistants that have been trained to treat common illnesses and complaints. They're usually fairly quick and inexpensive. However, if you have serious medical issues or chronic medical problems, these are probably not your best option.  No Primary Care Doctor: - Call Health Connect at  [phone] - they can help you locate a primary care doctor that  accepts your insurance, provides certain services, etc. - Physician Referral Service- [phone]  Chronic Pain Problems: Organization         Address  Phone   Notes  [facility]  [phone] Patients need to be referred by their primary care doctor.   Medication Assistance: Organization         Address  Phone   Notes  [facility] Medication [facility] [address] [place], [place] [address] [phone] --Must be a resident of [facility] -- Must have NO insurance coverage whatsoever (no Medicaid/ Medicare, etc.) -- The pt. MUST have a primary care doctor that  directs their care regularly and follows them in the community   [facility]  [phone]   [employer]  [phone]    Agencies that provide inexpensive medical care: Organization         Address  Phone   Notes  [place]  [phone]   [name]    [phone]   [facility], [place] [address] [phone]   [place] [address] [place] [phone]   Planned Parenthood    [phone]   [facility]    [phone]   [place] [address], [place] Phone:  [phone], Fax:  [phone] Hours of Operation:  9 am - 6 pm, M-F.  Also accepts Medicaid/Medicare and self-pay.  [facility] for [place], [address], [place] Phone: [phone], Fax: [phone]. Hours of Operation:  8:30 am - 5:30 pm, M-F.  Also accepts Medicaid and self-pay.  [facility] [address] , [place] Phone: [phone]   [place], [place], [place] [phone], [address]. [phone] Mondays & Thursdays: 7-9 AM.  First 15 patients are seen on a first come, first serve basis.    [place] Providers:  Organization         Address  Phone   Notes  [facility] [address], [place] [phone] Also accepts self-pay patients.  [facility] [address], [place]  [phone]   [place], [address], [place] [phone]   [facility] [address] [place] [phone]   Lucianne Lei [address], [place]   [phone] Only accepts [place] patients after they have their name applied to their card.   Self-Pay (no insurance) in [facility]:  Organization          Address  Phone   Notes  Sickle Cell Patients, [facility] [place] [phone]   [facility] [place] [phone]   [name]  [address], [address], [place] [phone]   [facility]/Dr. Vista Lawman  [address], [place] or [place], [address], [place] [phone] Phone number for both [place] and [place] locations is the same.  Urgent [facility] and [facility] [address] [place] [phone]   [facility] [address] [place] or [address] [phone]   [facility] [address], [place] [phone], phone; [phone], fax Sees patients 1st and 3rd Saturday of every month.  Must not qualify for public or private insurance (i.e. Medicaid, Medicare, [place] Health Choice, Veterans' Benefits)  Household income should be no more than 200% of the poverty level The clinic cannot treat you if you are pregnant or think you are pregnant  Sexually transmitted diseases are not treated at the clinic.

## 2014-10-21 NOTE — ED Notes (Signed)
When bringing pt back from triage offered pt a w/c pt refused RN aware

## 2014-10-21 NOTE — ED Provider Notes (Signed)
CSN: [id number]     Arrival date & time 10/21/14  1413 History   First MD Initiated Contact with Patient 10/21/14 1604     Chief Complaint  Patient presents with  . Hemorrhoids     (Consider location/radiation/quality/duration/timing/severity/associated sxs/prior Treatment) HPI Comments: [name] is a 44 y.o. female with a PMHx of cervical cancer and a PSHx of breast surgery, abdominal hysterectomy, and hernia repair, who presents to the ED with complaints of rectal pain x6 days. She states that she believed she had hemorrhoids, and was seen 3 days ago for this, but she feels that she is not having hemorrhoidal pain but rather she has a mass to the L of her rectum on the buttock and that this is causing the pain. She reports the pain is 10/10 constant throbbing nonradiating, worse with pressure to the area, and unrelieved with started suppositories, Preparation H, witch hazel, oxycodone, and Tylenol. She states the area has increased in size, but she is unsure if there is any warmth, erythema, or discharge. She reports a minimal amount of bright red blood coming from this area, and she believes that this is in her stool but she is not sure. She endorses associated nausea and one episode of vomiting in the lobby, which was nonbloody and nonbilious. She denies any fevers, chills, chest pain, shortness breath, abdominal pain, diarrhea, constipation, melanotic, hematemesis, dysuria, hematuria, flank pain, numbness, tingling, weakness, vaginal bleeding or discharge.  Patient is a 44 y.o. female presenting with hematochezia. The history is provided by the patient. No language interpreter was used.  Rectal Bleeding Quality:  Bright red Amount:  Scant Duration:  6 days Timing:  Intermittent Progression:  Unchanged Chronicity:  Recurrent Context: hemorrhoids and rectal pain   Pain details:    Quality:  Throbbing   Severity:  Severe   Duration:  6 days   Timing:  Constant   Progression:   Worsening Similar prior episodes: no   Relieved by:  Nothing Worsened by:  Nothing tried Ineffective treatments:  Hemorrhoid cream Associated symptoms: vomiting   Associated symptoms: no abdominal pain, no fever and no hematemesis     Past Medical History  Diagnosis Date  . Cancer   . Allergy    Past Surgical History  Procedure Laterality Date  . Breast surgery    . Abdominal hysterectomy    . Hernia repair     No family history on file. History  Substance Use Topics  . Smoking status: Former Research scientist (life sciences)  . Smokeless tobacco: Not on file  . Alcohol Use: No   OB History    No data available     Review of Systems  Constitutional: Negative for fever and chills.  Respiratory: Negative for shortness of breath.   Cardiovascular: Negative for chest pain.  Gastrointestinal: Positive for nausea, vomiting, hematochezia, anal bleeding and rectal pain. Negative for abdominal pain, diarrhea, constipation, blood in stool and hematemesis.  Genitourinary: Negative for dysuria, hematuria, flank pain, vaginal bleeding and vaginal discharge.  Musculoskeletal: Negative for myalgias and arthralgias.  Skin: Negative for color change.  Allergic/Immunologic: Negative for immunocompromised state.  Neurological: Negative for weakness and numbness.  Psychiatric/Behavioral: Negative for confusion.   10 Systems reviewed and are negative for acute change except as noted in the HPI.    Allergies  Amoxicillin; Penicillins; and Sulfa antibiotics  Home Medications   Prior to Admission medications   Medication Sig Start Date End Date Taking? Authorizing Provider  calcium citrate (CALCITRATE - DOSED IN MG  ELEMENTAL CALCIUM) 950 MG tablet Take 200 mg of elemental calcium by mouth daily.    Historical Provider, MD  cholecalciferol (VITAMIN D) 1000 UNITS tablet Take 1,000 Units by mouth daily.    Historical Provider, MD   BP 118/73 mmHg  Pulse 87  Temp(Src) 98.5 F (36.9 C) (Oral)  Resp 18  Ht '5\' 4"'   (1.626 m)  Wt 209 lb (94.802 kg)  BMI 35.86 kg/m2  SpO2 97% Physical Exam  Constitutional: She is oriented to person, place, and time. Vital signs are normal. She appears well-developed and well-nourished.  Non-toxic appearance. No distress.  Afebrile, nontoxic, NAD  HENT:  Head: Normocephalic and atraumatic.  Mouth/Throat: Oropharynx is clear and moist and mucous membranes are normal.  Eyes: Conjunctivae and EOM are normal. Right eye exhibits no discharge. Left eye exhibits no discharge.  Neck: Normal range of motion. Neck supple.  Cardiovascular: Normal rate, regular rhythm, normal heart sounds and intact distal pulses.  Exam reveals no gallop and no friction rub.   No murmur heard. Pulmonary/Chest: Effort normal and breath sounds normal. No respiratory distress. She has no decreased breath sounds. She has no wheezes. She has no rhonchi. She has no rales.  Abdominal: Soft. Normal appearance and bowel sounds are normal. She exhibits no distension. There is no tenderness. There is no rigidity, no rebound, no guarding, no CVA tenderness, no tenderness at McBurney's point and negative Murphy's sign.  Genitourinary: Rectal exam shows external hemorrhoid (old hemorrhoidal skin tags), mass (perirectal, L buttock) and tenderness. Rectal exam shows no internal hemorrhoid, no fissure and anal tone normal. Guaiac negative stool.    Pelvic exam was performed with patient prone. There is no rash, tenderness or lesion on the right labia. There is no rash, tenderness or lesion on the left labia.  Chaperone present No gross blood noted on rectal exam, normal tone, perirectal abscess to the L buttock, indurated (~4-5cm in length) with some mild fluctuance toward the gluteal cleft with surrounding erythema and warmth and exquisite tenderness to the entire L buttock cleft region. When palpated, purulent drainage expressed from just below the anal opening (although difficult to determine whether it's coming from  the anus itself). No palpable rectal mass or anal fissure, no palpable internal hemorrhoids but with old external hemorrhoidal skin tags present. FOBT neg  Musculoskeletal: Normal range of motion.  Neurological: She is alert and oriented to person, place, and time. She has normal strength. No sensory deficit.  Skin: Skin is warm, dry and intact. No rash noted.  Psychiatric: She has a normal mood and affect.  Nursing note and vitals reviewed.   ED Course  Procedures (including critical care time) Labs Review Labs Reviewed  CBC WITH DIFFERENTIAL/PLATELET - Abnormal; Notable for the following:    WBC 13.3 (*)    Neutrophils Relative % 86 (*)    Neutro Abs 11.5 (*)    Lymphocytes Relative 7 (*)    All other components within normal limits  COMPREHENSIVE METABOLIC PANEL - Abnormal; Notable for the following:    Glucose, Bld 101 (*)    Alkaline Phosphatase 134 (*)    All other components within normal limits  URINALYSIS, ROUTINE W REFLEX MICROSCOPIC (NOT AT [facility]) - Abnormal; Notable for the following:    Specific Gravity, Urine 1.031 (*)    Hgb urine dipstick TRACE (*)    All other components within normal limits  WOUND CULTURE  URINE MICROSCOPIC-ADD ON  POC OCCULT BLOOD, ED    Imaging Review Ct  Pelvis W Contrast  10/21/2014   CLINICAL DATA:  History of uterine cancer. Hemorrhoids and perirectal abscess.  EXAM: CT PELVIS WITH CONTRAST  TECHNIQUE: Multidetector CT imaging of the pelvis was performed using the standard protocol following the bolus administration of intravenous contrast.  CONTRAST:  4m OMNIPAQUE IOHEXOL 300 MG/ML  SOLN  COMPARISON:  None.  FINDINGS: There is an irregular low-density collection in the left perianal region. The largest component measures 3.6 x 3.1 x 4.9 cm. There is a small focus of air within this collection. There is a component that extends anterior, best seen on sequence 3, image 70. Findings are compatible with a complex abscess collection in the  perineum. There is soft tissue swelling and focal inflammation along the medial left buttock.  The uterus is surgically absent. The ovarian tissue is not confidently identified. No gross abnormality to the small bowel. No significant free fluid or lymphadenopathy in the pelvis.  No acute bone abnormality.  IMPRESSION: There is an irregular air-fluid collection in the perineum and the left perianal region compatible with an abscess collection.   Electronically Signed   By: AMarkus DaftM.D.   On: 10/21/2014 19:57     EKG Interpretation None      MDM   Final diagnoses:  Perirectal abscess  Cellulitis of buttock, left  Nausea    44y.o. female here with perirectal abscess with expanding L buttock cellulitis, seems to be draining just below the rectum but difficult to determine if there is a tract into the rectum. Will get CT pelvis to evaluate tract. Will get labs and give pain and nausea meds. Will reassess shortly. Will start abx and obtain culture of wound.  8:57 PM U/A without UTI. CBC with leukocytosis of 197.9 neutrophilic predominance. CMP with mildly elevated alk phos. FOBT neg. CT showing abscess of the L perianal region with cellulitis along L medial buttock. Does not seem to connect with rectum. Dr. WMingo Amberto proceed with I&D. Pt requesting more pain meds, will give another round of 473mMorphine now. Will reassess shortly.   9:58 PM I&D by Dr. WaMingo Ambererformed with copious drainage. Will give pain meds prior to d/c to help after that procedure. Will send home with pain meds, nausea meds, colace, and clindamycin. Will have her return in 2 days for recheck and packing removal. I explained the diagnosis and have given explicit precautions to return to the ER including for any other new or worsening symptoms. The patient understands and accepts the medical plan as it's been dictated and I have answered their questions. Discharge instructions concerning home care and prescriptions have been  given. The patient is STABLE and is discharged to home in good condition.  BP 121/72 mmHg  Pulse 97  Temp(Src) 99.7 F (37.6 C) (Oral)  Resp 22  Ht '5\' 4"'  (1.626 m)  Wt 209 lb (94.802 kg)  BMI 35.86 kg/m2  SpO2 94%  Meds ordered this encounter  Medications  . fentaNYL (SUBLIMAZE) injection 50 mcg    Sig:   . fentaNYL (SUBLIMAZE) 100 MCG/2ML injection    Sig:     Newsome, Sabrina   : cabinet override  . morphine 4 MG/ML injection 4 mg    Sig:   . ondansetron (ZOFRAN) injection 4 mg    Sig:   . metFORMIN (GLUCOPHAGE) 500 MG tablet    Sig: Take 500 mg by mouth 3 (three) times daily.  . DULoxetine (CYMBALTA) 30 MG capsule    Sig: Take 30  mg by mouth daily.  . Multiple Vitamin (MULTIVITAMIN WITH MINERALS) TABS tablet    Sig: Take 1 tablet by mouth daily.  Marland Kitchen aspirin-acetaminophen-caffeine (EXCEDRIN MIGRAINE) 250-250-65 MG per tablet    Sig: Take 1 tablet by mouth every 6 (six) hours as needed for headache.  . hydrocortisone (ANUSOL-HC) 25 MG suppository    Sig: Place 25 mg rectally 2 (two) times daily as needed for hemorrhoids.  . hydrocortisone (ANUSOL-HC) 2.5 % rectal cream    Sig: Place 1 application rectally 2 (two) times daily as needed for hemorrhoids.   . clindamycin (CLEOCIN) IVPB 600 mg    Sig:     Order Specific Question:  Antibiotic Indication:    Answer:  Cellulitis  . iohexol (OMNIPAQUE) 300 MG/ML solution 80 mL    Sig:   . lidocaine (XYLOCAINE) 2 % (with pres) injection 400 mg    Sig:   . ALPRAZolam (XANAX) tablet 0.5 mg    Sig:   . morphine 4 MG/ML injection 4 mg    Sig:   . docusate sodium (COLACE) 100 MG capsule    Sig: Take 1 capsule (100 mg total) by mouth 2 (two) times daily as needed for mild constipation or moderate constipation.    Dispense:  60 capsule    Refill:  0    Order Specific Question:  Supervising Provider    Answer:  MILLER, BRIAN [[id number]]  . HYDROcodone-acetaminophen (NORCO) 5-325 MG per tablet    Sig: Take 1 tablet by mouth every 6  (six) hours as needed for severe pain.    Dispense:  10 tablet    Refill:  0    Order Specific Question:  Supervising Provider    Answer:  Sabra Heck, BRIAN [[id number]]  . promethazine (PHENERGAN) 25 MG tablet    Sig: Take 1 tablet (25 mg total) by mouth every 6 (six) hours as needed for nausea or vomiting.    Dispense:  10 tablet    Refill:  0    Order Specific Question:  Supervising Provider    Answer:  Sabra Heck, BRIAN [[id number]]  . clindamycin (CLEOCIN) 300 MG capsule    Sig: Take 1 capsule (300 mg total) by mouth 4 (four) times daily. X 7 days    Dispense:  28 capsule    Refill:  0    Order Specific Question:  Supervising Provider    Answer:  MILLER, BRIAN [[id number]]  . morphine 4 MG/ML injection 4 mg    Sig:      Bora Broner Camprubi-Soms, PA-C 10/21/14 2158  Evelina Bucy, MD 10/22/14 1032

## 2014-10-22 ENCOUNTER — Encounter (HOSPITAL_COMMUNITY): Payer: Self-pay | Admitting: *Deleted

## 2014-10-22 NOTE — ED Provider Notes (Signed)
INCISION AND DRAINAGE Date/Time: 10/22/2014 10:32 AM Performed by: Evelina Bucy Authorized by: Evelina Bucy Consent: Verbal consent obtained. Type: abscess Body area: anogenital Location details: perianal Anesthesia: local infiltration Local anesthetic: lidocaine 1% without epinephrine Anesthetic total: 10 ml Patient sedated: yes Sedation type: anxiolysis Sedatives: lorazepam Risk factors: near anus. Scalpel size: 11 Incision type: single straight Complexity: complex Drainage: purulent and  bloody Drainage amount: copious Wound treatment: drain placed Packing material: 1/4 in iodoform gauze Patient tolerance: Patient tolerated the procedure well with no immediate complications     Evelina Bucy, MD 10/22/14 [phone]

## 2014-10-24 ENCOUNTER — Emergency Department (HOSPITAL_COMMUNITY)
Admission: EM | Admit: 2014-10-24 | Discharge: 2014-10-24 | Disposition: A | Discharge status: Home / Self Care | Payer: BLUE CROSS/BLUE SHIELD | Attending: Emergency Medicine | Admitting: Emergency Medicine

## 2014-10-24 ENCOUNTER — Encounter (HOSPITAL_COMMUNITY): Payer: Self-pay | Admitting: Emergency Medicine

## 2014-10-24 DIAGNOSIS — K611 Rectal abscess: Secondary | ICD-10-CM | POA: Diagnosis not present

## 2014-10-24 DIAGNOSIS — Z880 Allergy status to penicillin: Secondary | ICD-10-CM | POA: Diagnosis not present

## 2014-10-24 DIAGNOSIS — K612 Anorectal abscess: Secondary | ICD-10-CM

## 2014-10-24 DIAGNOSIS — K610 Anal abscess: Secondary | ICD-10-CM | POA: Insufficient documentation

## 2014-10-24 DIAGNOSIS — Z8541 Personal history of malignant neoplasm of cervix uteri: Secondary | ICD-10-CM | POA: Insufficient documentation

## 2014-10-24 DIAGNOSIS — Z792 Long term (current) use of antibiotics: Secondary | ICD-10-CM | POA: Diagnosis not present

## 2014-10-24 DIAGNOSIS — Z7982 Long term (current) use of aspirin: Secondary | ICD-10-CM | POA: Diagnosis not present

## 2014-10-24 DIAGNOSIS — Z79899 Other long term (current) drug therapy: Secondary | ICD-10-CM | POA: Diagnosis not present

## 2014-10-24 DIAGNOSIS — Z48817 Encounter for surgical aftercare following surgery on the skin and subcutaneous tissue: Secondary | ICD-10-CM | POA: Diagnosis present

## 2014-10-24 DIAGNOSIS — Z87891 Personal history of nicotine dependence: Secondary | ICD-10-CM | POA: Diagnosis not present

## 2014-10-24 LAB — WOUND CULTURE: Special Requests: NORMAL

## 2014-10-24 MED ORDER — CIPROFLOXACIN HCL 500 MG PO TABS: 500.0000 mg | ORAL_TABLET | Freq: Two times a day (BID) | ORAL | Status: DC

## 2014-10-24 NOTE — ED Provider Notes (Signed)
CSN: [id number]     Arrival date & time 10/24/14  0725 History   First MD Initiated Contact with Patient 10/24/14 0745     Chief Complaint  Patient presents with  . Rectal Problems  . Wound Check     (Consider location/radiation/quality/duration/timing/severity/associated sxs/prior Treatment) Patient is a 44 y.o. female presenting with wound check. The history is provided by the patient and medical records. No language interpreter was used.  Wound Check Pertinent negatives include no abdominal pain, chest pain, diaphoresis, fatigue, fever, headaches, nausea, rash or vomiting.     [name] is a 44 y.o. female  with a hx of cervical cancer presents to the Emergency Department for wound check after incision and drainage of rectal abscess on 10/22/2014. Patient reports that she needs her packing changed. She reports significant improvement in her pain with some assistant pain with walking.  She denies fevers or chills, nausea or vomiting. Patient was placed on clindamycin and has taken this as directed. She reports large amount of bloody and purulent drainage from the wound yesterday which has slowed down this morning.   Past Medical History  Diagnosis Date  . Cervical cancer 2015  . Cancer   . Allergy    Past Surgical History  Procedure Laterality Date  . Breast surgery    . Abdominal hysterectomy    . Hernia repair     No family history on file. History  Substance Use Topics  . Smoking status: Former Research scientist (life sciences)  . Smokeless tobacco: Not on file  . Alcohol Use: No     Comment: scial   OB History    Gravida Para Term Preterm AB TAB SAB Ectopic Multiple Living   0 0 0 0 0 0 0 0       Review of Systems  Constitutional: Negative for fever, diaphoresis, appetite change, fatigue and unexpected weight change.  HENT: Negative for mouth sores.   Eyes: Negative for visual disturbance.  Respiratory: Negative for shortness of breath.   Cardiovascular: Negative for chest pain.    Gastrointestinal: Negative for nausea, vomiting, abdominal pain, diarrhea and constipation.  Endocrine: Negative for polydipsia, polyphagia and polyuria.  Musculoskeletal: Negative for back pain and neck stiffness.  Skin: Positive for color change and wound (incision). Negative for rash.  Allergic/Immunologic: Negative for immunocompromised state.  Neurological: Negative for syncope, light-headedness and headaches.  Hematological: Does not bruise/bleed easily.  Psychiatric/Behavioral: Negative for sleep disturbance. The patient is not nervous/anxious.       Allergies  Amoxicillin; Penicillins; and Sulfa antibiotics  Home Medications   Prior to Admission medications   Medication Sig Start Date End Date Taking? Authorizing Provider  5-Hydroxytryptophan (5-HTP PO) Take 1 tablet by mouth daily.    Historical Provider, MD  aspirin 325 MG tablet Take 325 mg by mouth daily.    Historical Provider, MD  aspirin-acetaminophen-caffeine (EXCEDRIN MIGRAINE) [phone] MG per tablet Take 1 tablet by mouth every 6 (six) hours as needed for headache.    Historical Provider, MD  aspirin-acetaminophen-caffeine (EXCEDRIN MIGRAINE) [phone] MG per tablet Take 1 tablet by mouth every 6 (six) hours as needed for headache.    Historical Provider, MD  calcium citrate (CALCITRATE - DOSED IN MG ELEMENTAL CALCIUM) 950 MG tablet Take 200 mg of elemental calcium by mouth daily.    Historical Provider, MD  Cholecalciferol (VITAMIN D PO) Take 1 tablet by mouth daily.    Historical Provider, MD  cholecalciferol (VITAMIN D) 1000 UNITS tablet Take 1,000 Units by mouth daily.  Historical Provider, MD  ciprofloxacin (CIPRO) 500 MG tablet Take 1 tablet (500 mg total) by mouth every 12 (twelve) hours. 10/24/14   Kaleena Corrow, PA-C  clindamycin (CLEOCIN) 300 MG capsule Take 1 capsule (300 mg total) by mouth 4 (four) times daily. X 7 days 10/21/14   Mercedes Camprubi-Soms, PA-C  docusate sodium (COLACE) 100 MG capsule  Take 1 capsule (100 mg total) by mouth 2 (two) times daily as needed for mild constipation or moderate constipation. 10/21/14   Mercedes Camprubi-Soms, PA-C  DULoxetine (CYMBALTA) 30 MG capsule Take 30 mg by mouth daily.  09/19/14   Historical Provider, MD  DULoxetine (CYMBALTA) 30 MG capsule Take 30 mg by mouth daily.    Historical Provider, MD  HYDROcodone-acetaminophen (NORCO) 5-325 MG per tablet Take 1 tablet by mouth every 6 (six) hours as needed for severe pain. 10/21/14   Mercedes Camprubi-Soms, PA-C  hydrocortisone (ANUSOL-HC) 2.5 % rectal cream Place rectally 2 (two) times daily. 10/17/14   Juliet Rude, MD  hydrocortisone (ANUSOL-HC) 2.5 % rectal cream Place 1 application rectally 2 (two) times daily as needed for hemorrhoids.     Historical Provider, MD  hydrocortisone (ANUSOL-HC) 25 MG suppository Place 25 mg rectally 2 (two) times daily as needed for hemorrhoids.    Historical Provider, MD  IODINE, KELP, PO Take 1 tablet by mouth daily.    Historical Provider, MD  metFORMIN (GLUCOPHAGE) 500 MG tablet Take 1 tablet (500 mg total) by mouth daily with breakfast. 10/17/14   Juliet Rude, MD  metFORMIN (GLUCOPHAGE) 500 MG tablet Take 500 mg by mouth 3 (three) times daily.    Historical Provider, MD  Multiple Vitamin (MULTIVITAMIN WITH MINERALS) TABS tablet Take 1 tablet by mouth daily.    Historical Provider, MD  Multiple Vitamin (MULTIVITAMIN) tablet Take 1 tablet by mouth daily.    Historical Provider, MD  promethazine (PHENERGAN) 25 MG tablet Take 1 tablet (25 mg total) by mouth every 6 (six) hours as needed for nausea or vomiting. 10/21/14   Mercedes Camprubi-Soms, PA-C  THEANINE PO Take 1 tablet by mouth daily.    Historical Provider, MD   BP 105/64 mmHg  Pulse 80  Temp(Src) 98.6 F (37 C) (Oral)  Resp 17  SpO2 98% Physical Exam  Constitutional: She is oriented to person, place, and time. She appears well-developed and well-nourished. No distress.  HENT:  Head: Normocephalic and  atraumatic.  Eyes: Conjunctivae are normal. No scleral icterus.  Neck: Normal range of motion.  Cardiovascular: Normal rate, regular rhythm and intact distal pulses.   Pulmonary/Chest: Effort normal and breath sounds normal.  Abdominal: Soft. She exhibits no distension. There is no tenderness.  Genitourinary:     1cm incision of the left perineum with surrounding induration with purulent drainage from the incision but no palpable fluctuance; No packing in place  Lymphadenopathy:    She has no cervical adenopathy.  Neurological: She is alert and oriented to person, place, and time.  Skin: Skin is warm and dry. She is not diaphoretic. There is erythema.  Psychiatric: She has a normal mood and affect.  Nursing note and vitals reviewed.   ED Course  Procedures (including critical care time) Labs Review Labs Reviewed - No data to display  Imaging Review No results found.   EKG Interpretation None      MDM   Final diagnoses:  Abscess of anal or rectal region   Suezanne Cheshire resents for wound check and packing change. On exam patient with visible  incision and surrounding induration but no pocket of fluctuance.  No packing in the wound and unable to repack wound due to granulation tissue and significant pain to the patient.   Review culture results which show Escherichia coli, not covered by patient's clindamycin. Will add Cipro after discussion with pharmacy.  Patient is to return to the emergency department tomorrow morning, 24 hours from now, for wound recheck.  Commend sitz baths throughout the day to encourage continued drainage from the wound.  Patient is without systemic symptoms, she is afebrile without nausea or vomiting.  BP 105/64 mmHg  Pulse 80  Temp(Src) 98.6 F (37 C) (Oral)  Resp 17  SpO2 98%     Abigail Butts, PA-C 10/24/14 3845  Elnora Morrison, MD 10/24/14 1655

## 2014-10-24 NOTE — Discharge Instructions (Signed)
1. Medications: Ciprofloxacin, usual home medications 2. Treatment: rest, drink plenty of fluids, warm sitz bath 3 today 3. Follow Up: Please followup here in the emergency department tomorrow morning for wound recheck

## 2014-10-24 NOTE — ED Notes (Signed)
Pt. Stated, I had a abscess lanced and packed on Tuesday and need to have packing out and re-packed

## 2014-10-25 ENCOUNTER — Emergency Department (HOSPITAL_COMMUNITY)
Admission: EM | Admit: 2014-10-25 | Discharge: 2014-10-25 | Disposition: A | Discharge status: Home / Self Care | Payer: BLUE CROSS/BLUE SHIELD | Attending: Emergency Medicine | Admitting: Emergency Medicine

## 2014-10-25 ENCOUNTER — Encounter (HOSPITAL_COMMUNITY): Payer: Self-pay

## 2014-10-25 DIAGNOSIS — Z87891 Personal history of nicotine dependence: Secondary | ICD-10-CM | POA: Diagnosis not present

## 2014-10-25 DIAGNOSIS — Z880 Allergy status to penicillin: Secondary | ICD-10-CM | POA: Insufficient documentation

## 2014-10-25 DIAGNOSIS — Z79899 Other long term (current) drug therapy: Secondary | ICD-10-CM | POA: Insufficient documentation

## 2014-10-25 DIAGNOSIS — Z8541 Personal history of malignant neoplasm of cervix uteri: Secondary | ICD-10-CM | POA: Diagnosis not present

## 2014-10-25 DIAGNOSIS — L0231 Cutaneous abscess of buttock: Secondary | ICD-10-CM | POA: Diagnosis present

## 2014-10-25 DIAGNOSIS — Z7982 Long term (current) use of aspirin: Secondary | ICD-10-CM | POA: Diagnosis not present

## 2014-10-25 DIAGNOSIS — Z7952 Long term (current) use of systemic steroids: Secondary | ICD-10-CM | POA: Diagnosis not present

## 2014-10-25 MED ORDER — LIDOCAINE-EPINEPHRINE (PF) 2 %-1:200000 IJ SOLN
20.0000 mL | Freq: Once | INTRAMUSCULAR | Status: AC
  Administered 2014-10-25: 20 mL via INTRADERMAL
  Filled 2014-10-25: qty 20

## 2014-10-25 NOTE — ED Provider Notes (Signed)
CSN: [id number]     Arrival date & time 10/25/14  0915 History   First MD Initiated Contact with Patient 10/25/14 [phone]     Chief Complaint  Patient presents with  . Follow-up     (Consider location/radiation/quality/duration/timing/severity/associated sxs/prior Treatment) Patient is a 44 y.o. female presenting with abscess.  Abscess Location:  Ano-genital Ano-genital abscess location:  L buttock Abscess quality: fluctuance, painful and redness   Red streaking: no   Duration: about a week. Progression:  Unchanged Pain details:    Quality:  Pressure   Severity:  Moderate   Timing:  Constant   Progression:  Partially resolved Context comment:  Recent I and D Relieved by:  Nothing Exacerbated by: sitting down. Ineffective treatments: antibiotics. Associated symptoms: no fever     Past Medical History  Diagnosis Date  . Cervical cancer 2015  . Cancer   . Allergy    Past Surgical History  Procedure Laterality Date  . Breast surgery    . Abdominal hysterectomy    . Hernia repair     History reviewed. No pertinent family history. History  Substance Use Topics  . Smoking status: Former Research scientist (life sciences)  . Smokeless tobacco: Not on file  . Alcohol Use: No     Comment: scial   OB History    Gravida Para Term Preterm AB TAB SAB Ectopic Multiple Living   0 0 0 0 0 0 0 0       Review of Systems  Constitutional: Negative for fever.  All other systems reviewed and are negative.     Allergies  Amoxicillin; Penicillins; and Sulfa antibiotics  Home Medications   Prior to Admission medications   Medication Sig Start Date End Date Taking? Authorizing Provider  5-Hydroxytryptophan (5-HTP PO) Take 1 tablet by mouth daily.    Historical Provider, MD  aspirin 325 MG tablet Take 325 mg by mouth daily.    Historical Provider, MD  aspirin-acetaminophen-caffeine (EXCEDRIN MIGRAINE) [phone] MG per tablet Take 1 tablet by mouth every 6 (six) hours as needed for headache.     Historical Provider, MD  aspirin-acetaminophen-caffeine (EXCEDRIN MIGRAINE) [phone] MG per tablet Take 1 tablet by mouth every 6 (six) hours as needed for headache.    Historical Provider, MD  calcium citrate (CALCITRATE - DOSED IN MG ELEMENTAL CALCIUM) 950 MG tablet Take 200 mg of elemental calcium by mouth daily.    Historical Provider, MD  Cholecalciferol (VITAMIN D PO) Take 1 tablet by mouth daily.    Historical Provider, MD  cholecalciferol (VITAMIN D) 1000 UNITS tablet Take 1,000 Units by mouth daily.    Historical Provider, MD  ciprofloxacin (CIPRO) 500 MG tablet Take 1 tablet (500 mg total) by mouth every 12 (twelve) hours. 10/24/14   Hannah Muthersbaugh, PA-C  clindamycin (CLEOCIN) 300 MG capsule Take 1 capsule (300 mg total) by mouth 4 (four) times daily. X 7 days 10/21/14   Mercedes Camprubi-Soms, PA-C  docusate sodium (COLACE) 100 MG capsule Take 1 capsule (100 mg total) by mouth 2 (two) times daily as needed for mild constipation or moderate constipation. 10/21/14   Mercedes Camprubi-Soms, PA-C  DULoxetine (CYMBALTA) 30 MG capsule Take 30 mg by mouth daily.  09/19/14   Historical Provider, MD  DULoxetine (CYMBALTA) 30 MG capsule Take 30 mg by mouth daily.    Historical Provider, MD  HYDROcodone-acetaminophen (NORCO) 5-325 MG per tablet Take 1 tablet by mouth every 6 (six) hours as needed for severe pain. 10/21/14   Mercedes Camprubi-Soms, PA-C  hydrocortisone (  ANUSOL-HC) 2.5 % rectal cream Place rectally 2 (two) times daily. 10/17/14   Juliet Rude, MD  hydrocortisone (ANUSOL-HC) 2.5 % rectal cream Place 1 application rectally 2 (two) times daily as needed for hemorrhoids.     Historical Provider, MD  hydrocortisone (ANUSOL-HC) 25 MG suppository Place 25 mg rectally 2 (two) times daily as needed for hemorrhoids.    Historical Provider, MD  IODINE, KELP, PO Take 1 tablet by mouth daily.    Historical Provider, MD  metFORMIN (GLUCOPHAGE) 500 MG tablet Take 1 tablet (500 mg total) by mouth daily  with breakfast. 10/17/14   Juliet Rude, MD  metFORMIN (GLUCOPHAGE) 500 MG tablet Take 500 mg by mouth 3 (three) times daily.    Historical Provider, MD  Multiple Vitamin (MULTIVITAMIN WITH MINERALS) TABS tablet Take 1 tablet by mouth daily.    Historical Provider, MD  Multiple Vitamin (MULTIVITAMIN) tablet Take 1 tablet by mouth daily.    Historical Provider, MD  promethazine (PHENERGAN) 25 MG tablet Take 1 tablet (25 mg total) by mouth every 6 (six) hours as needed for nausea or vomiting. 10/21/14   Mercedes Camprubi-Soms, PA-C  THEANINE PO Take 1 tablet by mouth daily.    Historical Provider, MD   BP 105/69 mmHg  Pulse 81  Temp(Src) 97.5 F (36.4 C) (Oral)  Resp 16  SpO2 97% Physical Exam  Constitutional: She is oriented to person, place, and time. She appears well-developed and well-nourished. No distress.  HENT:  Head: Normocephalic and atraumatic.  Eyes: Conjunctivae are normal. No scleral icterus.  Neck: Neck supple.  Cardiovascular: Normal rate and intact distal pulses.   Pulmonary/Chest: Effort normal. No stridor. No respiratory distress.  Abdominal: Normal appearance. She exhibits no distension.  Genitourinary:     Neurological: She is alert and oriented to person, place, and time.  Skin: Skin is warm and dry. No rash noted.  Psychiatric: She has a normal mood and affect. Her behavior is normal.  Nursing note and vitals reviewed.   ED Course  INCISION AND DRAINAGE Date/Time: 10/25/2014 12:47 PM Performed by: Serita Grit Authorized by: Serita Grit Consent: Verbal consent obtained. Risks and benefits: risks, benefits and alternatives were discussed Consent given by: patient Type: abscess Body area: anogenital (left gluteus) Local anesthetic: lidocaine 2% with epinephrine Anesthetic total: 4 ml Scalpel size: 11 Incision type: single straight Complexity: simple Drainage: purulent Drainage amount: moderate Wound treatment: wound left open Packing material: 1/4  in iodoform gauze Patient tolerance: Patient tolerated the procedure well with no immediate complications   EMERGENCY DEPARTMENT US SOFT TISSUE INTERPRETATION "Study: Limited Soft Tissue Ultrasound"  INDICATIONS: Pain and Soft tissue infection Multiple views of the body part were obtained in real-time with a multi-frequency linear probe PERFORMED BY:  Myself IMAGES ARCHIVED?: Yes SIDE:Left BODY PART:Other soft tisse (comment in note) gluteus FINDINGS: Abcess present INTERPRETATION:  Abcess present     (including critical care time) Labs Review Labs Reviewed - No data to display  Imaging Review No results found.   EKG Interpretation None      MDM   Final diagnoses:  Abscess of left buttock    44 yo female with recently drained left perianal abscess.  She is on antibiotics (clinda and cipro).  On exam, she had an area suspected to be an abscess which was confirmed with bedside US.  It appeared distinct from anus and rectum.  I&D'd without difficulty.  She will return for wound check.      Serita Grit, MD  10/25/14 1252 

## 2014-10-25 NOTE — Discharge Instructions (Signed)

## 2014-10-25 NOTE — ED Notes (Signed)
Pt reports she had outpatient surgery to repair an anal abscess on Tuesday. Here for re-check.

## 2014-10-25 NOTE — ED Notes (Signed)
MD at bedside to drain abscess.

## 2014-12-09 ENCOUNTER — Encounter: Payer: Self-pay | Admitting: Internal Medicine

## 2015-01-09 ENCOUNTER — Encounter: Payer: Self-pay | Admitting: Internal Medicine

## 2015-02-13 ENCOUNTER — Ambulatory Visit (INDEPENDENT_AMBULATORY_CARE_PROVIDER_SITE_OTHER): Payer: BLUE CROSS/BLUE SHIELD | Admitting: Pulmonary Disease

## 2015-02-13 ENCOUNTER — Encounter: Payer: Self-pay | Admitting: Pulmonary Disease

## 2015-02-13 VITALS — BP 130/80 | HR 83 | Temp 98.0°F | Ht 64.0 in | Wt 220.2 lb

## 2015-02-13 DIAGNOSIS — G4733 Obstructive sleep apnea (adult) (pediatric): Secondary | ICD-10-CM | POA: Diagnosis not present
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
## 2015-02-13 NOTE — Patient Instructions (Signed)
Will arrange for home sleep study Will call to arrange for follow up after sleep study reviewed  

## 2015-02-24 DIAGNOSIS — G4733 Obstructive sleep apnea (adult) (pediatric): Secondary | ICD-10-CM | POA: Diagnosis not present

## 2015-02-26 ENCOUNTER — Telehealth: Payer: Self-pay | Admitting: Pulmonary Disease

## 2015-02-26 ENCOUNTER — Other Ambulatory Visit: Payer: Self-pay | Admitting: *Deleted

## 2015-02-26 DIAGNOSIS — G4733 Obstructive sleep apnea (adult) (pediatric): Secondary | ICD-10-CM | POA: Diagnosis not present

## 2015-02-26 NOTE — Telephone Encounter (Signed)
HST 02/24/15 >> AHI 9.4, SaO2 low 76%  Will have my nurse inform pt that sleep study shows mild sleep apnea.  Will have my nurse schedule ROV with me or Tammy Parrett to review Tx options >> CPAP versus oral appliance.

## 2015-02-26 NOTE — Telephone Encounter (Signed)
Pt is aware of sleep study results. OV has been scheduled with TP on 03/13/2015 at 10:15am. Nothing further was needed.

## 2015-03-13 ENCOUNTER — Ambulatory Visit (INDEPENDENT_AMBULATORY_CARE_PROVIDER_SITE_OTHER): Payer: BLUE CROSS/BLUE SHIELD | Admitting: Adult Health

## 2015-03-13 ENCOUNTER — Encounter: Payer: Self-pay | Admitting: Adult Health

## 2015-03-13 VITALS — BP 118/82 | HR 87 | Ht 64.0 in | Wt 222.2 lb

## 2015-03-13 DIAGNOSIS — G4733 Obstructive sleep apnea (adult) (pediatric): Secondary | ICD-10-CM | POA: Insufficient documentation

## 2015-03-13 HISTORY — DX: Morbid (severe) obesity due to excess calories: E66.01

## 2015-03-13 NOTE — Assessment & Plan Note (Addendum)
Mild OSA on HST   Plan  Begin CPAP At bedtime   Goal is to wear CPAP for at least 4-6 hrs as needed.  Work on weight loss .  Do not drive if sleepy.  CPAP Download in 1 month .  Follow up Dr. Halford Chessman  In 2 months and As needed

## 2015-03-13 NOTE — Progress Notes (Signed)
   Subjective:    Patient ID: [name], female    DOB: 28-Feb-1971, 44 y.o.   MRN: [id number]  HPI 44 yo female seen for sleep consult in 01/2015, dx with Mild OSA on HST   TEST  HST 02/24/15 >AHI 9.4 , SaO2 low 76%.   03/13/2015 Follow up : OSA  Pt returns for 1 month follow up . Seen last month for sleep consult with daytime sleepiness .  She had a home sleep study that showed mild sleep apnea with  HST with AHI 9.4, SaO2 low 76%. We reviewed her results and  Went over treatment options with wt loss, oral appliance and CPAP  She would like to proceed with CPAP .  She denies chest pain, orthopnea edema or fever.    Review of Systems  Constitutional:   No  weight loss, night sweats,  Fevers, chills,  +fatigue, or  lassitude.  HEENT:   No headaches,  Difficulty swallowing,  Tooth/dental problems, or  Sore throat,                No sneezing, itching, ear ache, nasal congestion, post nasal drip,   CV:  No chest pain,  Orthopnea, PND, swelling in lower extremities, anasarca, dizziness, palpitations, syncope.   GI  No heartburn, indigestion, abdominal pain, nausea, vomiting, diarrhea, change in bowel habits, loss of appetite, bloody stools.   Resp: No shortness of breath with exertion or at rest.  No excess mucus, no productive cough,  No non-productive cough,  No coughing up of blood.  No change in color of mucus.  No wheezing.  No chest wall deformity  Skin: no rash or lesions.  GU: no dysuria, change in color of urine, no urgency or frequency.  No flank pain, no hematuria   MS:  No joint pain or swelling.  No decreased range of motion.  No back pain.  Psych:  No change in mood or affect. No depression or anxiety.  No memory loss.         Objective:   Physical Exam  GEN: A/Ox3; pleasant , NAD, obese   HEENT:  Tea/AT,  EACs-clear, TMs-wnl, NOSE-clear, THROAT-clear, no lesions, no postnasal drip or exudate noted.  MP 2-3 airway   NECK:  Supple w/ fair ROM; no JVD;  normal carotid impulses w/o bruits; no thyromegaly or nodules palpated; no lymphadenopathy.  RESP  Clear  P & A; w/o, wheezes/ rales/ or rhonchi.no accessory muscle use, no dullness to percussion  CARD:  RRR, no m/r/g  , no peripheral edema, pulses intact, no cyanosis or clubbing.  GI:   Soft & nt; nml bowel sounds; no organomegaly or masses detected.  Musco: Warm bil, no deformities or joint swelling noted.   Neuro: alert, no focal deficits noted.    Skin: Warm, no lesions or rashes        Assessment & Plan:

## 2015-03-13 NOTE — Assessment & Plan Note (Addendum)
Work on weight loss.

## 2015-03-13 NOTE — Patient Instructions (Signed)
Begin CPAP At bedtime   Goal is to wear CPAP for at least 4-6 hrs as needed.  Work on weight loss .  Do not drive if sleepy.  CPAP Download in 1 month .  Follow up Dr. Halford Chessman  In 2 months and As needed

## 2015-03-14 NOTE — Progress Notes (Signed)
Reviewed and agree with assessment/plan. 

## 2015-04-25 ENCOUNTER — Ambulatory Visit (INDEPENDENT_AMBULATORY_CARE_PROVIDER_SITE_OTHER): Payer: BLUE CROSS/BLUE SHIELD | Admitting: Urgent Care

## 2015-04-25 VITALS — BP 118/80 | HR 79 | Temp 98.4°F | Resp 18 | Ht 66.25 in | Wt 220.8 lb

## 2015-04-25 DIAGNOSIS — J069 Acute upper respiratory infection, unspecified: Secondary | ICD-10-CM

## 2015-04-25 DIAGNOSIS — R438 Other disturbances of smell and taste: Secondary | ICD-10-CM | POA: Diagnosis not present

## 2015-04-25 DIAGNOSIS — R05 Cough: Secondary | ICD-10-CM

## 2015-04-25 DIAGNOSIS — R059 Cough, unspecified: Secondary | ICD-10-CM

## 2015-04-25 MED ORDER — AZITHROMYCIN 250 MG PO TABS: ORAL_TABLET | ORAL | Status: DC

## 2015-04-25 MED ORDER — HYDROCODONE-HOMATROPINE 5-1.5 MG/5ML PO SYRP: 5.0000 mL | ORAL_SOLUTION | Freq: Every evening | ORAL | Status: DC | PRN

## 2015-04-25 MED ORDER — BENZONATATE 100 MG PO CAPS
100.0000 mg | ORAL_CAPSULE | Freq: Three times a day (TID) | ORAL | Status: DC | PRN
Start: 2015-04-25 — End: 2015-05-19

## 2015-04-25 NOTE — Patient Instructions (Addendum)
- Start Zyrtec for antihistamine properties.   Upper Respiratory Infection, Adult Most upper respiratory infections (URIs) are a viral infection of the air passages leading to the lungs. A URI affects the nose, throat, and upper air passages. The most common type of URI is nasopharyngitis and is typically referred to as "the common cold." URIs run their course and usually go away on their own. Most of the time, a URI does not require medical attention, but sometimes a bacterial infection in the upper airways can follow a viral infection. This is called a secondary infection. Sinus and middle ear infections are common types of secondary upper respiratory infections. Bacterial pneumonia can also complicate a URI. A URI can worsen asthma and chronic obstructive pulmonary disease (COPD). Sometimes, these complications can require emergency medical care and may be life threatening.  CAUSES Almost all URIs are caused by viruses. A virus is a type of germ and can spread from one person to another.  RISKS FACTORS You may be at risk for a URI if:   You smoke.   You have chronic heart or lung disease.  You have a weakened defense (immune) system.   You are very young or very old.   You have nasal allergies or asthma.  You work in crowded or poorly ventilated areas.  You work in health care facilities or schools. SIGNS AND SYMPTOMS  Symptoms typically develop 2-3 days after you come in contact with a cold virus. Most viral URIs last 7-10 days. However, viral URIs from the influenza virus (flu virus) can last 14-18 days and are typically more severe. Symptoms may include:   Runny or stuffy (congested) nose.   Sneezing.   Cough.   Sore throat.   Headache.   Fatigue.   Fever.   Loss of appetite.   Pain in your forehead, behind your eyes, and over your cheekbones (sinus pain).  Muscle aches.  DIAGNOSIS  Your health care provider may diagnose a URI by:  Physical  exam.  Tests to check that your symptoms are not due to another condition such as:  Strep throat.  Sinusitis.  Pneumonia.  Asthma. TREATMENT  A URI goes away on its own with time. It cannot be cured with medicines, but medicines may be prescribed or recommended to relieve symptoms. Medicines may help:  Reduce your fever.  Reduce your cough.  Relieve nasal congestion. HOME CARE INSTRUCTIONS   Take medicines only as directed by your health care provider.   Gargle warm saltwater or take cough drops to comfort your throat as directed by your health care provider.  Use a warm mist humidifier or inhale steam from a shower to increase air moisture. This may make it easier to breathe.  Drink enough fluid to keep your urine clear or pale yellow.   Eat soups and other clear broths and maintain good nutrition.   Rest as needed.   Return to work when your temperature has returned to normal or as your health care provider advises. You may need to stay home longer to avoid infecting others. You can also use a face mask and careful hand washing to prevent spread of the virus.  Increase the usage of your inhaler if you have asthma.   Do not use any tobacco products, including cigarettes, chewing tobacco, or electronic cigarettes. If you need help quitting, ask your health care provider. PREVENTION  The best way to protect yourself from getting a cold is to practice good hygiene.   Avoid  oral or hand contact with people with cold symptoms.   Wash your hands often if contact occurs.  There is no clear evidence that vitamin C, vitamin E, echinacea, or exercise reduces the chance of developing a cold. However, it is always recommended to get plenty of rest, exercise, and practice good nutrition.  SEEK MEDICAL CARE IF:   You are getting worse rather than better.   Your symptoms are not controlled by medicine.   You have chills.  You have worsening shortness of breath.  You  have brown or red mucus.  You have yellow or brown nasal discharge.  You have pain in your face, especially when you bend forward.  You have a fever.  You have swollen neck glands.  You have pain while swallowing.  You have white areas in the back of your throat. SEEK IMMEDIATE MEDICAL CARE IF:   You have severe or persistent:  Headache.  Ear pain.  Sinus pain.  Chest pain.  You have chronic lung disease and any of the following:  Wheezing.  Prolonged cough.  Coughing up blood.  A change in your usual mucus.  You have a stiff neck.  You have changes in your:  Vision.  Hearing.  Thinking.  Mood. MAKE SURE YOU:   Understand these instructions.  Will watch your condition.  Will get help right away if you are not doing well or get worse.   This information is not intended to replace advice given to you by your health care provider. Make sure you discuss any questions you have with your health care provider.   Document Released: 10/26/2000 Document Revised: 09/16/2014 Document Reviewed: 08/07/2013 Elsevier Interactive Patient Education [employer].

## 2015-04-25 NOTE — Progress Notes (Signed)
    MRN: [id number] DOB: [birth year]  Subjective:   [name] is a 44 y.o. female presenting for chief complaint of Cough and Dizziness  Reports 4 week history worsening productive cough with metallic taste in her mouth. Has tried Mucinex and benadryl with minimal relief. Denies fever, chest pain, chest tightness, shob, wheezing, n/v, abdominal pain, sore throat, nasal congestion, sinus pain, ear drainage, ear pain, itchy or watery red eyes. Denies history of seasonal allergies or asthma. Denies smoking cigarettes.   [name] has a current medication list which includes the following prescription(s): aspirin, aspirin-acetaminophen-caffeine, cholecalciferol, divigel, and multivitamin with minerals. Also is allergic to amoxicillin; penicillins; and sulfa antibiotics.  [name]  has a past medical history of Cervical cancer ([place]) (2015); Cancer ([place]); and Allergy. Also  has past surgical history that includes Breast surgery; Abdominal hysterectomy; and Hernia repair.  Objective:   Vitals: BP 118/80 mmHg  Pulse 79  Temp(Src) 98.4 F (36.9 C) (Oral)  Resp 18  Ht 5' 6.25" (1.683 m)  Wt 220 lb 12.8 oz (100.154 kg)  BMI 35.36 kg/m2  SpO2 98%  Physical Exam  Constitutional: She is oriented to person, place, and time. She appears well-developed and well-nourished.  HENT:  TM's flat bilaterally, no effusions or erythema. Nasal turbinates mildly erythematous with thick whitish mucus. No sinus tenderness. Postnasal drip present with slight oropharyngeal erythema but no exudates or abscesses.  Eyes: Right eye exhibits no discharge. Left eye exhibits no discharge. No scleral icterus.  Neck: Normal range of motion. Neck supple.  Cardiovascular: Normal rate, regular rhythm and intact distal pulses.  Exam reveals no gallop and no friction rub.   No murmur heard. Pulmonary/Chest: No respiratory distress. She has no wheezes. She has no rales.  Lymphadenopathy:    She has no cervical adenopathy.    Neurological: She is alert and oriented to person, place, and time.  Skin: Skin is warm and dry. No rash noted. No erythema. No pallor.   Assessment and Plan :   1. Upper respiratory infection 2. Cough 3. Metallic taste - Start azithromycin to cover for infectious process given timeline and worsening of cough. Recommended patient start Zyrtec for possible allergies since patient reports that she has trouble with these symptoms around the same time each year. Hycodan and Tessalon for cough. RTC in 1 week if no improvement.  Jaynee Eagles, PA-C [facility] [phone] 04/25/2015 9:21 AM

## 2015-05-13 ENCOUNTER — Telehealth: Payer: Self-pay | Admitting: Adult Health

## 2015-05-13 ENCOUNTER — Ambulatory Visit: Payer: Self-pay | Admitting: Adult Health

## 2015-05-13 NOTE — Telephone Encounter (Signed)
Called and spoke with [place]. She wanted to inform us that she was the patient's case manager and that if anything was needed to contact her and she would be more then happy to assist [place] in providing for the patient's needs. Nothing further needed. Will sign off on message.

## 2015-05-19 ENCOUNTER — Ambulatory Visit (INDEPENDENT_AMBULATORY_CARE_PROVIDER_SITE_OTHER): Payer: BLUE CROSS/BLUE SHIELD | Admitting: Urgent Care

## 2015-05-19 VITALS — BP 138/74 | HR 91 | Temp 98.8°F | Resp 16 | Ht 66.25 in | Wt 220.0 lb

## 2015-05-19 DIAGNOSIS — M62838 Other muscle spasm: Secondary | ICD-10-CM

## 2015-05-19 DIAGNOSIS — H9201 Otalgia, right ear: Secondary | ICD-10-CM

## 2015-05-19 DIAGNOSIS — M6249 Contracture of muscle, multiple sites: Secondary | ICD-10-CM

## 2015-05-19 DIAGNOSIS — G43809 Other migraine, not intractable, without status migrainosus: Secondary | ICD-10-CM

## 2015-05-19 MED ORDER — BUTALBITAL-APAP-CAFFEINE 50-325-40 MG PO TABS: 1.0000 | ORAL_TABLET | Freq: Four times a day (QID) | ORAL | Status: DC | PRN

## 2015-05-19 MED ORDER — KETOROLAC TROMETHAMINE 60 MG/2ML IM SOLN
60.0000 mg | Freq: Once | INTRAMUSCULAR | Status: AC
Start: 2015-05-19 — End: 2015-05-19
  Administered 2015-05-19: 60 mg via INTRAMUSCULAR

## 2015-05-19 MED ORDER — CYCLOBENZAPRINE HCL 10 MG PO TABS: 5.0000 mg | ORAL_TABLET | Freq: Every day | ORAL | Status: DC | PRN

## 2015-05-19 NOTE — Patient Instructions (Signed)
-   DO NOT TAKE FLEXERIL AND FIORICET TOGETHER. - Flexeril is a muscle relaxant that can make you sleepy as a side effect - FIORICET is to help abort migraines.   Migraine Headache A migraine headache is an intense, throbbing pain on one or both sides of your head. A migraine can last for 30 minutes to several hours. CAUSES  The exact cause of a migraine headache is not always known. However, a migraine may be caused when nerves in the brain become irritated and release chemicals that cause inflammation. This causes pain. Certain things may also trigger migraines, such as:  Alcohol.  Smoking.  Stress.  Menstruation.  Aged cheeses.  Foods or drinks that contain nitrates, glutamate, aspartame, or tyramine.  Lack of sleep.  Chocolate.  Caffeine.  Hunger.  Physical exertion.  Fatigue.  Medicines used to treat chest pain (nitroglycerine), birth control pills, estrogen, and some blood pressure medicines. SIGNS AND SYMPTOMS  Pain on one or both sides of your head.  Pulsating or throbbing pain.  Severe pain that prevents daily activities.  Pain that is aggravated by any physical activity.  Nausea, vomiting, or both.  Dizziness.  Pain with exposure to bright lights, loud noises, or activity.  General sensitivity to bright lights, loud noises, or smells. Before you get a migraine, you may get warning signs that a migraine is coming (aura). An aura may include:  Seeing flashing lights.  Seeing bright spots, halos, or zigzag lines.  Having tunnel vision or blurred vision.  Having feelings of numbness or tingling.  Having trouble talking.  Having muscle weakness. DIAGNOSIS  A migraine headache is often diagnosed based on:  Symptoms.  Physical exam.  A CT scan or MRI of your head. These imaging tests cannot diagnose migraines, but they can help rule out other causes of headaches. TREATMENT Medicines may be given for pain and nausea. Medicines can also be  given to help prevent recurrent migraines.  HOME CARE INSTRUCTIONS  Only take over-the-counter or prescription medicines for pain or discomfort as directed by your health care provider. The use of long-term narcotics is not recommended.  Lie down in a dark, quiet room when you have a migraine.  Keep a journal to find out what may trigger your migraine headaches. For example, write down:  What you eat and drink.  How much sleep you get.  Any change to your diet or medicines.  Limit alcohol consumption.  Quit smoking if you smoke.  Get 7-9 hours of sleep, or as recommended by your health care provider.  Limit stress.  Keep lights dim if bright lights bother you and make your migraines worse. SEEK IMMEDIATE MEDICAL CARE IF:   Your migraine becomes severe.  You have a fever.  You have a stiff neck.  You have vision loss.  You have muscular weakness or loss of muscle control.  You start losing your balance or have trouble walking.  You feel faint or pass out.  You have severe symptoms that are different from your first symptoms. MAKE SURE YOU:   Understand these instructions.  Will watch your condition.  Will get help right away if you are not doing well or get worse.   This information is not intended to replace advice given to you by your health care provider. Make sure you discuss any questions you have with your health care provider.   Document Released: 05/02/2005 Document Revised: 05/23/2014 Document Reviewed: 01/07/2013 Elsevier Interactive Patient Education [employer].

## 2015-05-19 NOTE — Progress Notes (Signed)
    MRN: [id number] DOB: [birth year]  Subjective:   [name] is a 45 y.o. female presenting for chief complaint of Ear Pain and Headache  Ear - Reports several hour history of right ear pain. Pain is throbbing in nature and intermittent, tinnitus and has had migraine headache for several days with associated photosensitivity, neck pain. Headache is frontal, sees floaters. Denies fever, ear drainage, sinus pain, congestion, cough, sore throat, n/v, abdominal pain, numbness or tingling, weakness. Has tried lidocaine and steroid injections as prescribed by Dr. Domingo Cocking with [facility]. She has not taken triptan since Sunday.   [name] has a current medication list which includes the following prescription(s): cholecalciferol, divigel, multivitamin with minerals, aspirin, and aspirin-acetaminophen-caffeine. Also is allergic to amoxicillin; penicillins; and sulfa antibiotics.  [name]  has a past medical history of Cervical cancer ([place]) (2015); Cancer ([place]); and Allergy. Also  has past surgical history that includes Breast surgery; Abdominal hysterectomy; and Hernia repair.  Objective:   Vitals: BP 138/74 mmHg  Pulse 91  Temp(Src) 98.8 F (37.1 C) (Oral)  Resp 16  Ht 5' 6.25" (1.683 m)  Wt 220 lb (99.791 kg)  BMI 35.23 kg/m2  SpO2 97%  Physical Exam  Constitutional: She is oriented to person, place, and time. She appears well-developed and well-nourished.  HENT:  TM's intact bilaterally, no effusions or erythema. Nasal turbinates pink and moist, nasal passages patent. No sinus tenderness. Oropharynx clear, mucous membranes moist, dentition in good repair.  Eyes: EOM are normal. Pupils are equal, round, and reactive to light. Right eye exhibits no discharge. Left eye exhibits no discharge. No scleral icterus.  Neck: Normal range of motion. Neck supple.    Cardiovascular: Normal rate, regular rhythm and intact distal pulses.  Exam reveals no gallop and no friction rub.     No murmur heard. Pulmonary/Chest: No respiratory distress. She has no wheezes. She has no rales.  Neurological: She is alert and oriented to person, place, and time. No cranial nerve deficit.  Skin: Skin is warm and dry. No rash noted. No erythema. No pallor.  Psychiatric: Her mood appears anxious.   Assessment and Plan :   1. Other migraine without status migrainosus, not intractable 2. Ear pain, right 3. Spasm of cervical paraspinous muscle - Patient has done well with Toradol in the past. Provided with script for Flexeril for muscle spasms. Also gave her script for Fioricet to abort migraines. She is to follow up with Dr. Domingo Cocking for continuing headache management. - I suspect that her headaches are more stress headaches, patient seems anxious and overwhelmed with her daughter in clinic. I counseled patient on this and taking care of herself in so much as she can.   Jaynee Eagles, PA-C [facility] [phone] 05/19/2015 6:38 PM

## 2015-05-20 ENCOUNTER — Ambulatory Visit: Payer: Self-pay | Admitting: Adult Health

## 2015-05-30 ENCOUNTER — Ambulatory Visit (INDEPENDENT_AMBULATORY_CARE_PROVIDER_SITE_OTHER): Payer: BLUE CROSS/BLUE SHIELD | Admitting: Emergency Medicine

## 2015-05-30 VITALS — BP 127/86 | HR 78 | Temp 97.6°F | Resp 20 | Ht 66.25 in | Wt 221.0 lb

## 2015-05-30 DIAGNOSIS — B029 Zoster without complications: Secondary | ICD-10-CM | POA: Diagnosis not present

## 2015-05-30 MED ORDER — HYDROCODONE-ACETAMINOPHEN 5-325 MG PO TABS: 1.0000 | ORAL_TABLET | ORAL | Status: DC | PRN

## 2015-05-30 MED ORDER — VALACYCLOVIR HCL 1 G PO TABS: 1000.0000 mg | ORAL_TABLET | Freq: Three times a day (TID) | ORAL | Status: DC

## 2015-05-30 NOTE — Progress Notes (Signed)
Subjective:  Patient ID: [name], female    DOB: 25-Aug-1970  Age: 45 y.o. MRN: [id number]  CC: Rash   HPI [name] presents   Patient has a burning painful rash on her left flank. She has had chickenpox as a child. She has a vesicular eruption.  She has no fever or chills. No history of injury or new allergen exposure. She denies any other complaints  History [name] has a past medical history of Cervical cancer ([place]) (2015); Cancer ([place]); and Allergy.   She has past surgical history that includes Breast surgery; Abdominal hysterectomy; and Hernia repair.   Her  family history is not on file.  She   reports that she has quit smoking. She does not have any smokeless tobacco history on file. She reports that she does not drink alcohol or use illicit drugs.  Outpatient Prescriptions Prior to Visit  Medication Sig Dispense Refill  . aspirin 325 MG tablet Take 325 mg by mouth daily. Reported on 05/19/2015    . aspirin-acetaminophen-caffeine (EXCEDRIN MIGRAINE) 250-250-65 MG per tablet Take 1 tablet by mouth every 6 (six) hours as needed for headache. Reported on 05/19/2015    . butalbital-acetaminophen-caffeine (FIORICET) 50-325-40 MG tablet Take 1 tablet by mouth every 6 (six) hours as needed for headache. 5 tablet 0  . cholecalciferol (VITAMIN D) 1000 UNITS tablet Take 1,000 Units by mouth daily.    . cyclobenzaprine (FLEXERIL) 10 MG tablet Take 0.5-1 tablets (5-10 mg total) by mouth daily as needed for muscle spasms. 30 tablet 6  . DIVIGEL 1 MG/GM GEL PLACE 1 MG ONTO THE SKIN D  3  . Multiple Vitamin (MULTIVITAMIN WITH MINERALS) TABS tablet Take 1 tablet by mouth daily.     No facility-administered medications prior to visit.    Social History   Social History  . Marital Status: Single    Spouse Name: N/A  . Number of Children: N/A  . Years of Education: N/A   Social History Main Topics  . Smoking status: Former Research scientist (life sciences)  . Smokeless tobacco: None  . Alcohol Use:  No     Comment: scial  . Drug Use: No  . Sexual Activity: Not Asked   Other Topics Concern  . None   Social History Narrative   ** Merged History Encounter **         Review of Systems  Constitutional: Negative for fever, chills and appetite change.  HENT: Negative for congestion, ear pain, postnasal drip, sinus pressure and sore throat.   Eyes: Negative for pain and redness.  Respiratory: Negative for cough, shortness of breath and wheezing.   Cardiovascular: Negative for leg swelling.  Gastrointestinal: Negative for nausea, vomiting, abdominal pain, diarrhea, constipation and blood in stool.  Endocrine: Negative for polyuria.  Genitourinary: Negative for dysuria, urgency, frequency and flank pain.  Musculoskeletal: Negative for gait problem.  Skin: Positive for rash.  Neurological: Negative for weakness and headaches.  Psychiatric/Behavioral: Negative for confusion and decreased concentration. The patient is not nervous/anxious.     Objective:  BP 127/86 mmHg  Pulse 78  Temp(Src) 97.6 F (36.4 C) (Oral)  Resp 20  Ht 5' 6.25" (1.683 m)  Wt 221 lb (100.245 kg)  BMI 35.39 kg/m2  SpO2 97%  Physical Exam  Constitutional: She is oriented to person, place, and time. She appears well-developed and well-nourished. No distress.  HENT:  Head: Normocephalic and atraumatic.  Right Ear: External ear normal.  Left Ear: External ear normal.  Nose: Nose normal.  Eyes: Conjunctivae and EOM are normal. Pupils are equal, round, and reactive to light. No scleral icterus.  Neck: Normal range of motion. Neck supple. No tracheal deviation present.  Cardiovascular: Normal rate, regular rhythm and normal heart sounds.   Pulmonary/Chest: Effort normal. No respiratory distress. She has no wheezes. She has no rales.  Abdominal: She exhibits no mass. There is no tenderness. There is no rebound and no guarding.  Musculoskeletal: She exhibits no edema.  Lymphadenopathy:    She has no cervical  adenopathy.  Neurological: She is alert and oriented to person, place, and time. Coordination normal.  Skin: Skin is warm and dry. Rash noted.  Psychiatric: She has a normal mood and affect. Her behavior is normal.    she has a rash on her left side characteristic of shingles   Assessment & Plan:   [name] was seen today for rash.  Diagnoses and all orders for this visit:  Shingles  Other orders -     valACYclovir (VALTREX) 1000 MG tablet; Take 1 tablet (1,000 mg total) by mouth 3 (three) times daily. -     HYDROcodone-acetaminophen (NORCO) 5-325 MG tablet; Take 1-2 tablets by mouth every 4 (four) hours as needed.   I am having [name] start on valACYclovir and HYDROcodone-acetaminophen. I am also having her maintain her cholecalciferol, aspirin, multivitamin with minerals, aspirin-acetaminophen-caffeine, DIVIGEL, cyclobenzaprine, and butalbital-acetaminophen-caffeine.  Meds ordered this encounter  Medications  . valACYclovir (VALTREX) 1000 MG tablet    Sig: Take 1 tablet (1,000 mg total) by mouth 3 (three) times daily.    Dispense:  30 tablet    Refill:  0  . HYDROcodone-acetaminophen (NORCO) 5-325 MG tablet    Sig: Take 1-2 tablets by mouth every 4 (four) hours as needed.    Dispense:  30 tablet    Refill:  0    Appropriate red flag conditions were discussed with the patient as well as actions that should be taken.  Patient expressed his understanding.  Follow-up: Return if symptoms worsen or fail to improve.  Roselee Culver, MD

## 2015-05-30 NOTE — Patient Instructions (Signed)

## 2015-06-03 ENCOUNTER — Encounter (INDEPENDENT_AMBULATORY_CARE_PROVIDER_SITE_OTHER): Payer: Self-pay

## 2015-06-03 ENCOUNTER — Ambulatory Visit (INDEPENDENT_AMBULATORY_CARE_PROVIDER_SITE_OTHER): Payer: BLUE CROSS/BLUE SHIELD | Admitting: Adult Health

## 2015-06-03 ENCOUNTER — Encounter: Payer: Self-pay | Admitting: Adult Health

## 2015-06-03 VITALS — BP 126/74 | HR 82 | Temp 98.0°F | Ht 64.0 in | Wt 222.0 lb

## 2015-06-03 DIAGNOSIS — G4733 Obstructive sleep apnea (adult) (pediatric): Secondary | ICD-10-CM | POA: Diagnosis not present

## 2015-06-03 NOTE — Assessment & Plan Note (Signed)
Mild OSA improved on CPAP  encouarged on compliance   Plan  Continue on CPAP At bedtime   Goal is to wear CPAP for at least 4-6 hrs as needed.  Take machine by [facility] to have checked out Call back if you would like to try nasal pillows with chin strap.  Work on weight loss .  Do not drive if sleepy.  Follow up Dr. Halford Chessman  In 6 months and As needed

## 2015-06-03 NOTE — Progress Notes (Signed)
   Subjective:    Patient ID: [name], female    DOB: 1971-01-28, 45 y.o.   MRN: [id number]  HPI  45 yo female seen for sleep consult in 01/2015, dx with Mild OSA on HST   TEST  HST 02/24/15 >AHI 9.4 , SaO2 low 76%.   06/03/2015 Follow up : OSA  Pt returns for 3 month follow up . She was started on CPAP last ov.  Says she is doing well overall.  Uses CPAP on average 6-10 hours nightly, mask fits fine. Pt has no complaints at this time.  Feels great , feels it is really helping. Feels it has really made a difference w/  Decreased daytime sleepiness.  Download shows okay compliance at 73%. Avg 6hr. AHI 1.2 w/ min leaks. On auto set 5-15cmH2O. Feels she has slacked off last week or so. Feels machine is not working as well as it did at beginning.  She is going to take her machine to home care company for them to look at it.  Feels the mask may push on cheeks worried about under eye area. Discussed nasal pillows if becomes an issue.    Review of Systems   Constitutional:   No  weight loss, night sweats,  Fevers, chills,  +fatigue, or  lassitude.  HEENT:   No headaches,  Difficulty swallowing,  Tooth/dental problems, or  Sore throat,                No sneezing, itching, ear ache, nasal congestion, post nasal drip,   CV:  No chest pain,  Orthopnea, PND, swelling in lower extremities, anasarca, dizziness, palpitations, syncope.   GI  No heartburn, indigestion, abdominal pain, nausea, vomiting, diarrhea, change in bowel habits, loss of appetite, bloody stools.   Resp: No shortness of breath with exertion or at rest.  No excess mucus, no productive cough,  No non-productive cough,  No coughing up of blood.  No change in color of mucus.  No wheezing.  No chest wall deformity  Skin: no rash or lesions.  GU: no dysuria, change in color of urine, no urgency or frequency.  No flank pain, no hematuria   MS:  No joint pain or swelling.  No decreased range of motion.  No back  pain.  Psych:  No change in mood or affect. No depression or anxiety.  No memory loss.         Objective:   Physical Exam   GEN: A/Ox3; pleasant , NAD, obese   HEENT:  [place]/AT,  EACs-clear, TMs-wnl, NOSE-clear, THROAT-clear, no lesions, no postnasal drip or exudate noted.  MP 2-3 airway   NECK:  Supple w/ fair ROM; no JVD; normal carotid impulses w/o bruits; no thyromegaly or nodules palpated; no lymphadenopathy.  RESP  Clear  P & A; w/o, wheezes/ rales/ or rhonchi.no accessory muscle use, no dullness to percussion  CARD:  RRR, no m/r/g  , no peripheral edema, pulses intact, no cyanosis or clubbing.  GI:   Soft & nt; nml bowel sounds; no organomegaly or masses detected.  Musco: Warm bil, no deformities or joint swelling noted.   Neuro: alert, no focal deficits noted.    Skin: Warm, no lesions or rashes        Assessment & Plan:

## 2015-06-03 NOTE — Progress Notes (Signed)
Reviewed and agree with assessment/plan. 

## 2015-06-03 NOTE — Assessment & Plan Note (Signed)
-   Weight loss 

## 2015-06-03 NOTE — Patient Instructions (Signed)
Continue on CPAP At bedtime   Goal is to wear CPAP for at least 4-6 hrs as needed.  Take machine by [facility] to have checked out Call back if you would like to try nasal pillows with chin strap.  Work on weight loss .  Do not drive if sleepy.  Follow up Dr. Halford Chessman  In 6 months and As needed

## 2015-06-04 ENCOUNTER — Ambulatory Visit (INDEPENDENT_AMBULATORY_CARE_PROVIDER_SITE_OTHER): Payer: BLUE CROSS/BLUE SHIELD | Admitting: Family Medicine

## 2015-06-04 VITALS — BP 140/86 | HR 97 | Temp 97.8°F | Resp 18

## 2015-06-04 DIAGNOSIS — N3001 Acute cystitis with hematuria: Secondary | ICD-10-CM

## 2015-06-04 DIAGNOSIS — R350 Frequency of micturition: Secondary | ICD-10-CM | POA: Diagnosis not present

## 2015-06-04 DIAGNOSIS — R300 Dysuria: Secondary | ICD-10-CM | POA: Diagnosis not present

## 2015-06-04 LAB — POC MICROSCOPIC URINALYSIS (UMFC): MUCUS RE: ABSENT

## 2015-06-04 LAB — POCT URINALYSIS DIP (MANUAL ENTRY)
BILIRUBIN UA: NEGATIVE
Glucose, UA: NEGATIVE
Ketones, POC UA: NEGATIVE
NITRITE UA: NEGATIVE
PH UA: 5
Protein Ur, POC: >=300 — AB
Spec Grav, UA: >=1.03
UROBILINOGEN UA: 0.2

## 2015-06-04 MED ORDER — CIPROFLOXACIN HCL 250 MG PO TABS: 250.0000 mg | ORAL_TABLET | Freq: Two times a day (BID) | ORAL | Status: DC

## 2015-06-04 NOTE — Patient Instructions (Signed)
You have a UTI.  Take the Keflex three times a day for the next 7 days.    You can take the Azo for pain relief as well.   It sounds like you have a lot of stress in your life right now.  This most likely is what triggered the UTI and shingles.    Come back in the next 10 - 14 days for a repeat urine test to make sure the blood is gone.    It was good to meet you today!  Urinary Tract Infection Urinary tract infections (UTIs) can develop anywhere along your urinary tract. Your urinary tract is your body's drainage system for removing wastes and extra water. Your urinary tract includes two kidneys, two ureters, a bladder, and a urethra. Your kidneys are a pair of bean-shaped organs. Each kidney is about the size of your fist. They are located below your ribs, one on each side of your spine. CAUSES Infections are caused by microbes, which are microscopic organisms, including fungi, viruses, and bacteria. These organisms are so small that they can only be seen through a microscope. Bacteria are the microbes that most commonly cause UTIs. SYMPTOMS  Symptoms of UTIs may vary by age and gender of the patient and by the location of the infection. Symptoms in young women typically include a frequent and intense urge to urinate and a painful, burning feeling in the bladder or urethra during urination. Older women and men are more likely to be tired, shaky, and weak and have muscle aches and abdominal pain. A fever may mean the infection is in your kidneys. Other symptoms of a kidney infection include pain in your back or sides below the ribs, nausea, and vomiting. DIAGNOSIS To diagnose a UTI, your caregiver will ask you about your symptoms. Your caregiver will also ask you to provide a urine sample. The urine sample will be tested for bacteria and white blood cells. White blood cells are made by your body to help fight infection. TREATMENT  Typically, UTIs can be treated with medication. Because most UTIs  are caused by a bacterial infection, they usually can be treated with the use of antibiotics. The choice of antibiotic and length of treatment depend on your symptoms and the type of bacteria causing your infection. HOME CARE INSTRUCTIONS  If you were prescribed antibiotics, take them exactly as your caregiver instructs you. Finish the medication even if you feel better after you have only taken some of the medication.  Drink enough water and fluids to keep your urine clear or pale yellow.  Avoid caffeine, tea, and carbonated beverages. They tend to irritate your bladder.  Empty your bladder often. Avoid holding urine for long periods of time.  Empty your bladder before and after sexual intercourse.  After a bowel movement, women should cleanse from front to back. Use each tissue only once. SEEK MEDICAL CARE IF:   You have back pain.  You develop a fever.  Your symptoms do not begin to resolve within 3 days. SEEK IMMEDIATE MEDICAL CARE IF:   You have severe back pain or lower abdominal pain.  You develop chills.  You have nausea or vomiting.  You have continued burning or discomfort with urination. MAKE SURE YOU:   Understand these instructions.  Will watch your condition.  Will get help right away if you are not doing well or get worse.   This information is not intended to replace advice given to you by your health care provider.  Make sure you discuss any questions you have with your health care provider.   Document Released: 02/09/2005 Document Revised: 01/21/2015 Document Reviewed: 06/10/2011 Elsevier Interactive Patient Education [employer].

## 2015-06-04 NOTE — Addendum Note (Signed)
Addended byMingo Amber, Zulema Pulaski H on: 06/04/2015 08:00 PM   Modules accepted: Orders, SmartSet

## 2015-06-04 NOTE — Progress Notes (Signed)
[name] is a 45 y.o. female who presents to Urgent Care today with complaints concerning for UTI:  1.  Concern for UTI: [name] is a 45 y.o. female who complains of urinary frequency, urgency and dysuria x 1 day.  Started at 11 AM with increasing suprapubic pressure.  Noted some dark urine, becoming darker and concerning for blood as day progressed.  She denies any flank or back pain, fever, chills, vomiting, or abnormal vaginal discharge/itching/bleeding.  No history of incontinence.    Has history of cervical CA.  Father with history of bladder CA.    The following portions of the patient's history were reviewed and updated as appropriate: allergies, current medications, past medical history, family and social history, and problem list.  Patient is a nonsmoker.    Past Medical History  Diagnosis Date  . Cervical cancer ([place]) 2015  . Cancer ([place])   . Allergy    Past Surgical History  Procedure Laterality Date  . Breast surgery    . Abdominal hysterectomy    . Hernia repair      Medications reviewed. Current Outpatient Prescriptions  Medication Sig Dispense Refill  . aspirin 325 MG tablet Take 325 mg by mouth daily. Reported on 05/19/2015    . aspirin-acetaminophen-caffeine (EXCEDRIN MIGRAINE) 250-250-65 MG per tablet Take 1 tablet by mouth every 6 (six) hours as needed for headache. Reported on 05/19/2015    . baclofen (LIORESAL) 10 MG tablet Take 10 mg by mouth 3 (three) times daily as needed.    . butalbital-acetaminophen-caffeine (FIORICET) 50-325-40 MG tablet Take 1 tablet by mouth every 6 (six) hours as needed for headache. 5 tablet 0  . cholecalciferol (VITAMIN D) 1000 UNITS tablet Take 1,000 Units by mouth daily.    . cyclobenzaprine (FLEXERIL) 10 MG tablet Take 0.5-1 tablets (5-10 mg total) by mouth daily as needed for muscle spasms. 30 tablet 6  . DIVIGEL 1 MG/GM GEL PLACE 1 MG ONTO THE SKIN D  3  . Multiple Vitamin (MULTIVITAMIN WITH MINERALS) TABS tablet Take 1  tablet by mouth daily.    . valACYclovir (VALTREX) 1000 MG tablet Take 1 tablet (1,000 mg total) by mouth 3 (three) times daily. 30 tablet 0  . HYDROcodone-acetaminophen (NORCO) 5-325 MG tablet Take 1-2 tablets by mouth every 4 (four) hours as needed. (Patient not taking: Reported on 06/04/2015) 30 tablet 0   No current facility-administered medications for this visit.    ROS as above otherwise neg.      Objective:   Physical Exam BP 140/86 mmHg  Pulse 97  Temp(Src) 97.8 F (36.6 C) (Oral)  Resp 18  SpO2 97% Gen:  Alert, cooperative patient who appears stated age in no acute distress.  Vital signs reviewed. Mouth: MMM Cardiac:  Regular rate and rhythm without murmur auscultated.  Good S1/S2. Pulm:  Clear to auscultation bilaterally with good air movement.  No wheezes or rales noted.   Abdomen:  Soft/ND/NT.  No CVA tenderness bilaterally   Results for orders placed or performed in visit on 06/04/15  POCT urinalysis dipstick  Result Value Ref Range   Color, UA yellow yellow   Clarity, UA cloudy (A) clear   Glucose, UA negative negative   Bilirubin, UA negative negative   Ketones, POC UA negative negative   Spec Grav, UA >=1.030    Blood, UA large (A) negative   pH, UA 5.0    Protein Ur, POC >=300 (A) negative   Urobilinogen, UA 0.2    Nitrite,  UA Negative Negative   Leukocytes, UA small (1+) (A) Negative   Imp/Plan:  1.  UTI: Initially, was going to treat with keflex.  However, she has rash with PCN's and amoxicillin.  Also with allergy to sulfa drugs.  She has no trouble with cipro previously, therefore will treat with this.   Treat based on symptoms and U/A. Pyridium prn. Call or return to clinic prn if these symptoms worsen or fail to improve as anticipated, or if she begins to have any back pain, fevers, or chills. Return for repeat UA in next 2 weeks to check for hemoglobinuria in this patient with personal history of cervical CA and father with bladder CA.

## 2015-06-07 LAB — URINE CULTURE: Colony Count: >=100000

## 2015-06-08 ENCOUNTER — Ambulatory Visit (INDEPENDENT_AMBULATORY_CARE_PROVIDER_SITE_OTHER): Payer: BLUE CROSS/BLUE SHIELD | Admitting: Urgent Care

## 2015-06-08 VITALS — BP 134/90 | HR 86 | Temp 98.5°F | Ht 65.0 in | Wt 221.0 lb

## 2015-06-08 DIAGNOSIS — R300 Dysuria: Secondary | ICD-10-CM

## 2015-06-08 DIAGNOSIS — H9201 Otalgia, right ear: Secondary | ICD-10-CM

## 2015-06-08 DIAGNOSIS — B029 Zoster without complications: Secondary | ICD-10-CM | POA: Diagnosis not present

## 2015-06-08 DIAGNOSIS — N3090 Cystitis, unspecified without hematuria: Secondary | ICD-10-CM | POA: Diagnosis not present

## 2015-06-08 DIAGNOSIS — F4541 Pain disorder exclusively related to psychological factors: Secondary | ICD-10-CM | POA: Diagnosis not present

## 2015-06-08 LAB — POCT URINALYSIS DIP (MANUAL ENTRY)
BILIRUBIN UA: NEGATIVE
BILIRUBIN UA: NEGATIVE
Glucose, UA: NEGATIVE
Leukocytes, UA: NEGATIVE
Nitrite, UA: NEGATIVE
SPEC GRAV UA: 1.025
Urobilinogen, UA: 0.2
pH, UA: 6

## 2015-06-08 LAB — POC MICROSCOPIC URINALYSIS (UMFC): MUCUS RE: ABSENT

## 2015-06-08 MED ORDER — FLUOXETINE HCL 20 MG PO TABS: 20.0000 mg | ORAL_TABLET | Freq: Every day | ORAL | Status: DC

## 2015-06-08 MED ORDER — CIPROFLOXACIN HCL 500 MG PO TABS: 500.0000 mg | ORAL_TABLET | Freq: Two times a day (BID) | ORAL | Status: DC

## 2015-06-08 NOTE — Patient Instructions (Addendum)
Urinary Tract Infection Urinary tract infections (UTIs) can develop anywhere along your urinary tract. Your urinary tract is your body's drainage system for removing wastes and extra water. Your urinary tract includes two kidneys, two ureters, a bladder, and a urethra. Your kidneys are a pair of bean-shaped organs. Each kidney is about the size of your fist. They are located below your ribs, one on each side of your spine. CAUSES Infections are caused by microbes, which are microscopic organisms, including fungi, viruses, and bacteria. These organisms are so small that they can only be seen through a microscope. Bacteria are the microbes that most commonly cause UTIs. SYMPTOMS  Symptoms of UTIs may vary by age and gender of the patient and by the location of the infection. Symptoms in young women typically include a frequent and intense urge to urinate and a painful, burning feeling in the bladder or urethra during urination. Older women and men are more likely to be tired, shaky, and weak and have muscle aches and abdominal pain. A fever may mean the infection is in your kidneys. Other symptoms of a kidney infection include pain in your back or sides below the ribs, nausea, and vomiting. DIAGNOSIS To diagnose a UTI, your caregiver will ask you about your symptoms. Your caregiver will also ask you to provide a urine sample. The urine sample will be tested for bacteria and white blood cells. White blood cells are made by your body to help fight infection. TREATMENT  Typically, UTIs can be treated with medication. Because most UTIs are caused by a bacterial infection, they usually can be treated with the use of antibiotics. The choice of antibiotic and length of treatment depend on your symptoms and the type of bacteria causing your infection. HOME CARE INSTRUCTIONS  If you were prescribed antibiotics, take them exactly as your caregiver instructs you. Finish the medication even if you feel better after  you have only taken some of the medication.  Drink enough water and fluids to keep your urine clear or pale yellow.  Avoid caffeine, tea, and carbonated beverages. They tend to irritate your bladder.  Empty your bladder often. Avoid holding urine for long periods of time.  Empty your bladder before and after sexual intercourse.  After a bowel movement, women should cleanse from front to back. Use each tissue only once. SEEK MEDICAL CARE IF:   You have back pain.  You develop a fever.  Your symptoms do not begin to resolve within 3 days. SEEK IMMEDIATE MEDICAL CARE IF:   You have severe back pain or lower abdominal pain.  You develop chills.  You have nausea or vomiting.  You have continued burning or discomfort with urination. MAKE SURE YOU:   Understand these instructions.  Will watch your condition.  Will get help right away if you are not doing well or get worse.   This information is not intended to replace advice given to you by your health care provider. Make sure you discuss any questions you have with your health care provider.   Document Released: 02/09/2005 Document Revised: 01/21/2015 Document Reviewed: 06/10/2011 Elsevier Interactive Patient Education 2016 Elsevier Inc.    Fluoxetine capsules or tablets (Depression/Mood Disorders) What is this medicine? FLUOXETINE (floo OX e teen) belongs to a class of drugs known as selective serotonin reuptake inhibitors (SSRIs). It helps to treat mood problems such as depression, obsessive compulsive disorder, and panic attacks. It can also treat certain eating disorders. This medicine may be used for other purposes;  ask your health care provider or pharmacist if you have questions. What should I tell my health care provider before I take this medicine? They need to know if you have any of these conditions: -bipolar disorder or mania -diabetes -glaucoma -liver disease -psychosis -seizures -suicidal thoughts or  history of attempted suicide -an unusual or allergic reaction to fluoxetine, other medicines, foods, dyes, or preservatives -pregnant or trying to get pregnant -breast-feeding How should I use this medicine? Take this medicine by mouth with a glass of water. Follow the directions on the prescription label. You can take this medicine with or without food. Take your medicine at regular intervals. Do not take it more often than directed. Do not stop taking this medicine suddenly except upon the advice of your doctor. Stopping this medicine too quickly may cause serious side effects or your condition may worsen. A special MedGuide will be given to you by the pharmacist with each prescription and refill. Be sure to read this information carefully each time. Talk to your pediatrician regarding the use of this medicine in children. While this drug may be prescribed for children as young as 7 years for selected conditions, precautions do apply. Overdosage: If you think you have taken too much of this medicine contact a poison control center or emergency room at once. NOTE: This medicine is only for you. Do not share this medicine with others. What if I miss a dose? If you miss a dose, skip the missed dose and go back to your regular dosing schedule. Do not take double or extra doses. What may interact with this medicine? Do not take fluoxetine with any of the following medications: -other medicines containing fluoxetine, like Sarafem or Symbyax -cisapride -linezolid -MAOIs like Carbex, Eldepryl, Marplan, Nardil, and Parnate -methylene blue (injected into a vein) -pimozide -thioridazine This medicine may also interact with the following medications: -alcohol -aspirin and aspirin-like medicines -carbamazepine -certain medicines for depression, anxiety, or psychotic disturbances -certain medicines for migraine headaches like almotriptan, eletriptan, frovatriptan, naratriptan, rizatriptan, sumatriptan,  zolmitriptan -digoxin -diuretics -fentanyl -flecainide -furazolidone -isoniazid -lithium -medicines for sleep -medicines that treat or prevent blood clots like warfarin, enoxaparin, and dalteparin -NSAIDs, medicines for pain and inflammation, like ibuprofen or naproxen -phenytoin -procarbazine -propafenone -rasagiline -ritonavir -supplements like St. John's wort, kava kava, valerian -tramadol -tryptophan -vinblastine This list may not describe all possible interactions. Give your health care provider a list of all the medicines, herbs, non-prescription drugs, or dietary supplements you use. Also tell them if you smoke, drink alcohol, or use illegal drugs. Some items may interact with your medicine. What should I watch for while using this medicine? Tell your doctor if your symptoms do not get better or if they get worse. Visit your doctor or health care professional for regular checks on your progress. Because it may take several weeks to see the full effects of this medicine, it is important to continue your treatment as prescribed by your doctor. Patients and their families should watch out for new or worsening thoughts of suicide or depression. Also watch out for sudden changes in feelings such as feeling anxious, agitated, panicky, irritable, hostile, aggressive, impulsive, severely restless, overly excited and hyperactive, or not being able to sleep. If this happens, especially at the beginning of treatment or after a change in dose, call your health care professional. Dennis Bast may get drowsy or dizzy. Do not drive, use machinery, or do anything that needs mental alertness until you know how this medicine affects you. Do not  stand or sit up quickly, especially if you are an older patient. This reduces the risk of dizzy or fainting spells. Alcohol may interfere with the effect of this medicine. Avoid alcoholic drinks. Your mouth may get dry. Chewing sugarless gum or sucking hard candy, and  drinking plenty of water may help. Contact your doctor if the problem does not go away or is severe. This medicine may affect blood sugar levels. If you have diabetes, check with your doctor or health care professional before you change your diet or the dose of your diabetic medicine. What side effects may I notice from receiving this medicine? Side effects that you should report to your doctor or health care professional as soon as possible: -allergic reactions like skin rash, itching or hives, swelling of the face, lips, or tongue -breathing problems -confusion -eye pain, changes in vision -fast or irregular heart rate, palpitations -flu-like fever, chills, cough, muscle or joint aches and pains -seizures -suicidal thoughts or other mood changes -swelling or redness in or around the eye -tremors -trouble sleeping -unusual bleeding or bruising -unusually tired or weak -vomiting Side effects that usually do not require medical attention (report to your doctor or health care professional if they continue or are bothersome): -change in sex drive or performance -diarrhea -dry mouth -flushing -headache -increased or decreased appetite -nausea -sweating This list may not describe all possible side effects. Call your doctor for medical advice about side effects. You may report side effects to FDA at 1-800-FDA-1088. Where should I keep my medicine? Keep out of the reach of children. Store at room temperature between 15 and 30 degrees C (59 and 86 degrees F). Throw away any unused medicine after the expiration date. NOTE: This sheet is a summary. It may not cover all possible information. If you have questions about this medicine, talk to your doctor, pharmacist, or health care provider.    2016, Elsevier/Gold Standard. (2014-04-25 12:40:07)

## 2015-06-08 NOTE — Progress Notes (Signed)
MRN: [id number] DOB: April 23, 1971  Subjective:   [name] is a 45 y.o. female presenting for follow up on Cystitis. Patient was seen on 06/04/2015, started on Cipro, provided with script for 3 days. Today, reports that she has had ~3 UTI's in her life. However, patient admits today that she still has urinary frequency, dysuria again all night.   Stress - patient also wanted to report that she has started to see a therapist for binge eating. During her last visit with me, we discussed stress headaches and the fact that she is overwhelmed. Patient has really become more aware of this and is now trying to be more proactive about her mental health. 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 She admits that her work is a significant stressor and plans on not working this job anymore as of 10/2015. She has previously tried only one medication for depression and anxiety, Cymbalta, but stopped this due to significant adverse effects. She is not opposed to starting a different one due to the nature of her work and still feeling overwhelmed and down about this. She also wants me to check her right ear to make sure it is not infected.  [name] has a current medication list which includes the following prescription(s): cholecalciferol, divigel, multivitamin with minerals, valacyclovir, and ciprofloxacin. Also is allergic to amoxicillin; eggs or egg-derived products; other; penicillins; and sulfa antibiotics.  [name]  has a past medical history of Cervical cancer ([place]) (2015); Cancer ([place]); and Allergy. Also  has past surgical history that includes Breast surgery; Abdominal hysterectomy; and Hernia repair.  Objective:   Vitals: BP 134/90 mmHg  Pulse 86  Temp(Src) 98.5 F (36.9 C) (Oral)  Ht 5\' 5"  (1.651 m)  Wt 221 lb (100.245 kg)  BMI 36.78 kg/m2  SpO2 98%  Wt Readings from Last 3 Encounters:  06/08/15 221 lb (100.245 kg)  06/03/15 222 lb (100.699 kg)    05/30/15 221 lb (100.245 kg)   Physical Exam  Constitutional: She is oriented to person, place, and time. She appears well-developed and well-nourished.  HENT:  TM's intact, non-erythematous.   Cardiovascular: Normal rate, regular rhythm and intact distal pulses.  Exam reveals no gallop and no friction rub.   No murmur heard. Pulmonary/Chest: No respiratory distress. She has no wheezes. She has no rales.  Abdominal: Soft. Bowel sounds are normal. She exhibits no distension and no mass. There is tenderness (generalized belly pain).  Musculoskeletal: She exhibits no edema.  Neurological: She is alert and oriented to person, place, and time.  Skin: Skin is warm and dry.       Results for orders placed or performed in visit on 06/08/15 (from the past 24 hour(s))  POCT urinalysis dipstick     Status: Abnormal   Collection Time: 06/08/15 10:30 AM  Result Value Ref Range   Color, UA yellow yellow   Clarity, UA clear clear   Glucose, UA negative negative   Bilirubin, UA negative negative   Ketones, POC UA negative negative   Spec Grav, UA 1.025    Blood, UA trace-intact (A) negative   pH, UA 6.0    Protein Ur, POC trace (A) negative   Urobilinogen, UA 0.2    Nitrite, UA Negative Negative   Leukocytes, UA Negative Negative  POCT Microscopic Urinalysis ([facility])     Status: Abnormal   Collection Time: 06/08/15 10:30 AM  Result Value Ref Range   WBC,UR,HPF,POC None  None WBC/hpf   RBC,UR,HPF,POC None None RBC/hpf   Bacteria None None, Too numerous to count   Mucus Absent Absent   Epithelial Cells, UR Per Microscopy Few (A) None, Too numerous to count cells/hpf   Assessment and Plan :   1. Cystitis 2. Dysuria - Will add an additional week of Cipro for further treatment of her UTI given that she became symptomatic again just 1 day after 3 day course of Cipro. Patient agreed.  3. Shingles - Resolving rash, continue taking Valtrex to completion.  4. Stress headaches - Start  fluoxetine 20mg  for situational depression and anxiety. Recheck in 4-6 weeks. Continue f/u with therapist.  5. Ear pain, right - Normal, reassured patient  Jaynee Eagles, PA-C [facility] [phone] 06/08/2015 10:21 AM

## 2015-06-09 ENCOUNTER — Telehealth: Payer: Self-pay | Admitting: Family Medicine

## 2015-06-09 LAB — URINE CULTURE: Colony Count: >=100000

## 2015-06-09 NOTE — Telephone Encounter (Signed)
Urine culture did show UTI.  Sensitive to Cipro, which is what she was treated with.  Can you call and let the patient know and check in to make sure she has improved?    Thanks,  JW

## 2015-06-09 NOTE — Telephone Encounter (Signed)
Pt came in to recheck with Rosario Adie yesterday. Was having continued symptoms.

## 2015-06-11 ENCOUNTER — Encounter: Payer: Self-pay | Admitting: Urgent Care

## 2015-06-26 ENCOUNTER — Telehealth: Payer: Self-pay | Admitting: Family Medicine

## 2015-06-26 NOTE — Telephone Encounter (Signed)
lmom that patient appointment has been cancelled with Freida Busman on 07-30-2015 please to reschedule letter sent also

## 2015-07-22 DIAGNOSIS — G4733 Obstructive sleep apnea (adult) (pediatric): Secondary | ICD-10-CM | POA: Diagnosis not present

## 2015-07-22 DIAGNOSIS — G4710 Hypersomnia, unspecified: Secondary | ICD-10-CM | POA: Diagnosis not present

## 2015-07-30 ENCOUNTER — Ambulatory Visit: Payer: Self-pay | Admitting: Urgent Care

## 2015-08-22 DIAGNOSIS — G4733 Obstructive sleep apnea (adult) (pediatric): Secondary | ICD-10-CM | POA: Diagnosis not present

## 2015-08-22 DIAGNOSIS — G4710 Hypersomnia, unspecified: Secondary | ICD-10-CM | POA: Diagnosis not present

## 2015-08-24 DIAGNOSIS — K603 Anal fistula: Secondary | ICD-10-CM | POA: Diagnosis not present

## 2015-09-07 DIAGNOSIS — F509 Eating disorder, unspecified: Secondary | ICD-10-CM | POA: Diagnosis not present

## 2015-09-08 DIAGNOSIS — K603 Anal fistula: Secondary | ICD-10-CM | POA: Diagnosis not present

## 2015-09-15 DIAGNOSIS — F5001 Anorexia nervosa, restricting type: Secondary | ICD-10-CM | POA: Diagnosis not present

## 2015-09-21 DIAGNOSIS — G4733 Obstructive sleep apnea (adult) (pediatric): Secondary | ICD-10-CM | POA: Diagnosis not present

## 2015-09-21 DIAGNOSIS — G4710 Hypersomnia, unspecified: Secondary | ICD-10-CM | POA: Diagnosis not present

## 2015-09-29 DIAGNOSIS — F5001 Anorexia nervosa, restricting type: Secondary | ICD-10-CM | POA: Diagnosis not present

## 2015-10-05 ENCOUNTER — Ambulatory Visit: Payer: BLUE CROSS/BLUE SHIELD | Attending: Specialist | Admitting: Physical Therapy

## 2015-10-05 DIAGNOSIS — H8112 Benign paroxysmal vertigo, left ear: Secondary | ICD-10-CM | POA: Insufficient documentation

## 2015-10-05 NOTE — Therapy (Signed)
[facility] [address] [place], [place], [address] Phone: [phone]   Fax:  [phone]  Physical Therapy Evaluation  Patient Details  Name: [name] MRN: [id number] Date of Birth: 11-14-1970 Referring Provider: Orie Rout, MD  Encounter Date: 10/05/2015      PT End of Session - 10/05/15 1207    Visit Number 1   Number of Visits 4   Date for PT Re-Evaluation 11/02/15   PT Start Time 1100   PT Stop Time 1140   PT Time Calculation (min) 40 min   Activity Tolerance Patient tolerated treatment well;Other (comment)  nausea present at end of session   Behavior During Therapy [facility] for tasks assessed/performed      Past Medical History  Diagnosis Date  . Cervical cancer ([place]) 2015  . Cancer ([place])   . Allergy     Past Surgical History  Procedure Laterality Date  . Breast surgery    . Abdominal hysterectomy    . Hernia repair      There were no vitals filed for this visit.       Subjective Assessment - 10/05/15 1103    Subjective Pt is a 45 y/o female who presents to OPPT with vertigo provoked by positional movements.  Pt describes symptoms as a spinning lasting seconds only.  Pt with dx of cervical cancer in 2015 and reports symptoms were exacerbated at that time.     Pertinent History Cervical cancer 2015 s/p radical hysterectomy; multiple food allergies, chronic migraines   Patient Stated Goals resolve dizziness; wants to exercise to lose weight, play with 66 y/o daughter   Currently in Pain? No/denies            [facility] PT Assessment - 10/05/15 1107    Assessment   Medical Diagnosis vertigo   Referring Provider Orie Rout, MD   Onset Date/Surgical Date --  intermittent x 20 years   Next MD Visit June 2017   Prior Therapy none   Balance Screen   Has the patient fallen in the past 6 months No   Has the patient had a decrease in activity level because of a fear of falling?  Yes   Is the patient reluctant to leave their home because of a fear of falling?  No   Home Ecologist residence   Living Arrangements Children   Prior Function   Level of Independence Independent   Vocation Part time employment   Administrator - social studies   Observation/Other Assessments   Focus on Therapeutic Outcomes (FOTO)  77 (23% limited; predicted 16% limited)   Dizziness Handicap Inventory (DHI)  46 (moderate)            Vestibular Assessment - 10/05/15 1112    Symptom Behavior   Type of Dizziness Spinning   Frequency of Dizziness everyday and every time pt lies down   Duration of Dizziness seconds   Aggravating Factors Lying supine;Looking up to the ceiling;Rolling to right;Rolling to left;Sitting with head tilted back   Relieving Factors Head stationary;Comments  resolves quickly   Occulomotor Exam   Occulomotor Alignment Normal   Spontaneous Absent   Gaze-induced Absent   Smooth Pursuits Intact   Saccades Intact  more difficulty to R but still WNL   Positional Testing   Dix-Hallpike Dix-Hallpike Right;Dix-Hallpike Left   Horizontal Canal Testing Horizontal Canal Right;Horizontal Canal Left   Dix-Hallpike Right   Dix-Hallpike Right Duration none  Dix-Hallpike Right Symptoms No nystagmus   Dix-Hallpike Left   Dix-Hallpike Left Duration 5 sec   Dix-Hallpike Left Symptoms Upbeat, left rotatory nystagmus   Horizontal Canal Right   Horizontal Canal Right Duration none   Horizontal Canal Right Symptoms Normal   Horizontal Canal Left   Horizontal Canal Left Duration none   Horizontal Canal Left Symptoms Normal                Vestibular Treatment/Exercise - 10/05/15 1204    Vestibular Treatment/Exercise   Vestibular Treatment Provided Canalith Repositioning   Canalith Repositioning Epley Manuever Left    EPLEY MANUEVER LEFT   Number of Reps  2   Overall Response  Improved Symptoms     RESPONSE DETAILS LEFT symptoms greatly improved with pt reporting only "1 little spin."  After repositioning and pt sat x 5 min she requested to lie down which she performed without difficulty or symptoms.  Upon returning to sitting pt c/o increased symptoms specifically nausea.  Nausea persisted and pt advised to sit and rest until symptoms subsided.  Recommended no driving unless symptoms resolved and pt verbalized understanding.               PT Education - 10/05/15 1207    Education provided Yes   Education Details clinical findings, BPPV, treatment and POC/goals of care   Person(s) Educated Patient   Methods Explanation;Handout   Comprehension Verbalized understanding             PT Long Term Goals - 10/05/15 1209    PT LONG TERM GOAL #1   Title demonstrate negative positional testing (11/02/15)   Time 4   Period Weeks   Status New   PT LONG TERM GOAL #2   Title independent with HEP if needed (11/02/15)   Time 4   Period Weeks   Status New               Plan - 10/05/15 1208    Clinical Impression Statement Pt is a 45 y/o female who presents to OPPT with clinical findings consistent with L BPPV.  Pt will benefit from PT to address vertigo and return to ADLs and functional mobility without vertigo.   Rehab Potential Excellent   PT Frequency 1x / week   PT Duration 4 weeks   PT Treatment/Interventions ADLs/Self Care Home Management;Vestibular;Canalith Repostioning;Patient/family education;Neuromuscular re-education;Balance training;Therapeutic exercise;Therapeutic activities;Functional mobility training   PT Next Visit Plan reassess; SOT if indicated, give brandt-daroff if needed   Consulted and Agree with Plan of Care Patient      Patient will benefit from skilled therapeutic intervention in order to improve the following deficits and impairments:  Dizziness, Decreased mobility  Visit Diagnosis: BPPV (benign paroxysmal positional vertigo), left - Plan: PT  plan of care cert/re-cert     Problem List Patient Active Problem List   Diagnosis Date Noted  . OSA (obstructive sleep apnea) 03/13/2015  . Morbid obesity ([place]) 03/13/2015   [name], PT, DPT 10/05/2015 12:13 PM  [facility] [address] [place], [place], [address] Phone: [phone]   Fax:  [phone]  Name: [name] MRN: [id number] Date of Birth: February 15, 1971

## 2015-10-08 DIAGNOSIS — F331 Major depressive disorder, recurrent, moderate: Secondary | ICD-10-CM | POA: Diagnosis not present

## 2015-10-13 ENCOUNTER — Ambulatory Visit: Payer: BLUE CROSS/BLUE SHIELD | Admitting: Physical Therapy

## 2015-10-13 DIAGNOSIS — F5001 Anorexia nervosa, restricting type: Secondary | ICD-10-CM | POA: Diagnosis not present

## 2015-10-15 DIAGNOSIS — F331 Major depressive disorder, recurrent, moderate: Secondary | ICD-10-CM | POA: Diagnosis not present

## 2015-10-15 DIAGNOSIS — F411 Generalized anxiety disorder: Secondary | ICD-10-CM | POA: Diagnosis not present

## 2015-10-19 ENCOUNTER — Ambulatory Visit: Payer: BLUE CROSS/BLUE SHIELD | Admitting: Physical Therapy

## 2015-10-22 DIAGNOSIS — G4733 Obstructive sleep apnea (adult) (pediatric): Secondary | ICD-10-CM | POA: Diagnosis not present

## 2015-10-22 DIAGNOSIS — F331 Major depressive disorder, recurrent, moderate: Secondary | ICD-10-CM | POA: Diagnosis not present

## 2015-10-22 DIAGNOSIS — G4710 Hypersomnia, unspecified: Secondary | ICD-10-CM | POA: Diagnosis not present

## 2015-10-23 ENCOUNTER — Ambulatory Visit: Payer: BLUE CROSS/BLUE SHIELD | Attending: Specialist

## 2015-10-23 DIAGNOSIS — H8112 Benign paroxysmal vertigo, left ear: Secondary | ICD-10-CM | POA: Diagnosis not present

## 2015-10-23 DIAGNOSIS — H8111 Benign paroxysmal vertigo, right ear: Secondary | ICD-10-CM | POA: Insufficient documentation

## 2015-10-23 DIAGNOSIS — R42 Dizziness and giddiness: Secondary | ICD-10-CM | POA: Diagnosis not present

## 2015-10-23 DIAGNOSIS — R2689 Other abnormalities of gait and mobility: Secondary | ICD-10-CM | POA: Diagnosis not present

## 2015-10-23 NOTE — Patient Instructions (Addendum)
Sit to Side-Lying    Sit on edge of bed. 1. Turn head 45 to right. 2. Maintain head position and lie down slowly on left side. Hold until symptoms subside plus 30 seconds.. 3. Sit up slowly. Hold until symptoms subside. 4. Turn head 45 to left. 5. Maintain head position and lie down slowly on right side. Hold until symptoms subside plus 30 seconds. 6. Sit up slowly. Repeat sequence __3__ times per session. Do __1-2__ sessions per day.  Copyright  VHI. All rights reserved.    Side to Side Head Motion    Perform without assistive device. Walking on solid surface, turn head and eyes to left for __2__ steps. Then, turn head and eyes to opposite side for __2__ steps. Repeat sequence _4___ times per session. Do _1___ sessions per day.   Copyright  VHI. All rights reserved.  Up / Down Head Motion    Perform without assistive device. Walking on solid surface, move head and eyes toward ceiling for __2__ steps. Then, move head and eyes toward floor for __2__ steps. Repeat __4__ times per session. Do __1__ sessions per day.   Copyright  VHI. All rights reserved.    Feet Together (Compliant Surface) Head Motion - Eyes Closed    Stand on compliant surface: __pillow/cushion______ with feet together. Close eyes and move head slowly, up and down 5 times and side to side 5 times. Repeat __3__ times per session. Do __1__ sessions per day.  Copyright  VHI. All rights reserved.  Feet Together (Compliant Surface) Varied Arm Positions - Eyes Closed    Stand on compliant surface: ___pillow/cushion_____ with feet together and arms at your side. Close eyes and visualize upright position. Hold__30__ seconds. Repeat _3___ times per session. Do __1__ sessions per day.  Copyright  VHI. All rights reserved.

## 2015-10-23 NOTE — Therapy (Signed)
[facility] [address] [place], [place], [address] Phone: [phone]   Fax:  [phone]  Physical Therapy Treatment  Patient Details  Name: [name] MRN: [id number] Date of Birth: 08/26/1970 Referring Provider: Santiago Glad, MD  Encounter Date: 10/23/2015      PT End of Session - 10/23/15 1243    Visit Number 2   Number of Visits 4   Date for PT Re-Evaluation 11/02/15   Authorization Type BCBS   PT Start Time 1020   PT Stop Time 1101   PT Time Calculation (min) 41 min   Equipment Utilized During Treatment --  min guard to S for safety   Activity Tolerance Patient tolerated treatment well   Behavior During Therapy [facility] for tasks assessed/performed      Past Medical History  Diagnosis Date  . Cervical cancer (HCC) 2015  . Cancer (HCC)   . Allergy     Past Surgical History  Procedure Laterality Date  . Breast surgery    . Abdominal hysterectomy    . Hernia repair      There were no vitals filed for this visit.      Subjective Assessment - 10/23/15 1023    Subjective Pt reported dizziness has greatly improved since first session. She reported that lying supine would cause everything to spin 4-6 revolutions and now she experiences 1/2 a revolution. Pt still feels fullness in R ear and occasionally staggers during gait when turning head.    Pertinent History Cervical cancer 2015 s/p radical hysterectomy; multiple food allergies, chronic migraines   Patient Stated Goals resolve dizziness; wants to exercise to lose weight, play with 58 y/o daughter   Currently in Pain? No/denies                Vestibular Assessment - 10/23/15 1045    Dix-Hallpike Right   Dix-Hallpike Right Duration none   Dix-Hallpike Right Symptoms No nystagmus   Dix-Hallpike Left   Dix-Hallpike Left Duration Pt reported slight dizziness and 1/2 revolution spin   Dix-Hallpike Left Symptoms No nystagmus                  [facility] Adult PT Treatment/Exercise - 10/23/15 1247    High Level Balance   High Level Balance Activities Head turns   High Level Balance Comments Performed at counter with 1 UE support, 4x7'/activity cues for technique and to improve narrow BOS. Added to HEP.         Vestibular Treatment/Exercise - 10/23/15 0001    Vestibular Treatment/Exercise   Vestibular Treatment Provided Habituation   Habituation Exercises Shelba Flake   Number of Reps  3   Symptom Description  Pt reportefd wooziness 3/10 during first rep which decr. to 1/10 during 3rd rep and pt reported she felt better. No nystagmus noted.             Balance Exercises - 10/23/15 1239    Balance Exercises: Standing   Standing Eyes Opened Wide (BOA);Narrow base of support (BOS);Foam/compliant surface;5 reps;Head turns   Standing Eyes Closed Narrow base of support (BOS);Wide (BOA);Head turns;Foam/compliant surface;2 reps;20 secs;30 secs   Other Standing Exercises Pt reported slight wooziness during exercises but reported it was tolerable. PT provided pt with balance and habituation HEP, please see pt instructions for details.           PT Education - 10/23/15 1242    Education provided Yes   Education Details Balance and  habituation HEP provided to pt. PT also encouraged pt to see MD regarding fullness/hearing loss/tinnitus in R ear, pt reported she saw an ENT MD and was not happy with the service.  PT educated pt to perform habituation HEP until she has 2 full days of no dizziness.   Person(s) Educated Patient   Methods Explanation;Demonstration;Verbal cues;Handout   Comprehension Returned demonstration;Verbalized understanding             PT Long Term Goals - 10/05/15 1209    PT LONG TERM GOAL #1   Title demonstrate negative positional testing (11/02/15)   Time 4   Period Weeks   Status New   PT LONG TERM GOAL #2   Title independent with HEP if needed (11/02/15)    Time 4   Period Weeks   Status New               Plan - 10/23/15 1244    Clinical Impression Statement Pt demonstrated progress, as B Dix-Hallpike negative for nystagmus and spinning sensation. Pt continues to feel wooziness and impaired balance during activities which require vestibular input. Pt reported improved sx's after habituation exercises. Continue with POC.    Rehab Potential Excellent   PT Frequency 1x / week   PT Duration 4 weeks   PT Treatment/Interventions ADLs/Self Care Home Management;Vestibular;Canalith Repostioning;Patient/family education;Neuromuscular re-education;Balance training;Therapeutic exercise;Therapeutic activities;Functional mobility training   PT Next Visit Plan reassess BPPV, dynamic gait training and review HEP as needed.   PT Home Exercise Plan Habituation and balance HEP.   Consulted and Agree with Plan of Care Patient      Patient will benefit from skilled therapeutic intervention in order to improve the following deficits and impairments:  Dizziness, Decreased mobility  Visit Diagnosis: BPPV (benign paroxysmal positional vertigo), left     Problem List Patient Active Problem List   Diagnosis Date Noted  . OSA (obstructive sleep apnea) 03/13/2015  . Morbid obesity (HCC) 03/13/2015    Kameka Whan L 10/23/2015, 12:48 PM  [place] [facility] [address] [place], [place], [address] Phone: [phone]   Fax:  [phone]  Name: [name] MRN: [id number] Date of Birth: 1970/09/18    Zerita Boers, PT,DPT 10/23/2015 12:48 PM Phone: [phone] Fax: [phone]

## 2015-10-26 DIAGNOSIS — M542 Cervicalgia: Secondary | ICD-10-CM | POA: Diagnosis not present

## 2015-10-26 DIAGNOSIS — G43719 Chronic migraine without aura, intractable, without status migrainosus: Secondary | ICD-10-CM | POA: Diagnosis not present

## 2015-10-26 DIAGNOSIS — M791 Myalgia: Secondary | ICD-10-CM | POA: Diagnosis not present

## 2015-10-26 DIAGNOSIS — G518 Other disorders of facial nerve: Secondary | ICD-10-CM | POA: Diagnosis not present

## 2015-10-27 DIAGNOSIS — K610 Anal abscess: Secondary | ICD-10-CM | POA: Diagnosis not present

## 2015-10-29 DIAGNOSIS — S90851A Superficial foreign body, right foot, initial encounter: Secondary | ICD-10-CM | POA: Diagnosis not present

## 2015-11-03 ENCOUNTER — Ambulatory Visit: Payer: BLUE CROSS/BLUE SHIELD

## 2015-11-03 DIAGNOSIS — R42 Dizziness and giddiness: Secondary | ICD-10-CM | POA: Diagnosis not present

## 2015-11-03 DIAGNOSIS — H8111 Benign paroxysmal vertigo, right ear: Secondary | ICD-10-CM | POA: Diagnosis not present

## 2015-11-03 DIAGNOSIS — R2689 Other abnormalities of gait and mobility: Secondary | ICD-10-CM | POA: Diagnosis not present

## 2015-11-03 DIAGNOSIS — H8112 Benign paroxysmal vertigo, left ear: Secondary | ICD-10-CM

## 2015-11-03 NOTE — Therapy (Signed)
[facility] [address] [place], [place], [address] Phone: [phone]   Fax:  [phone]  Physical Therapy Treatment  Patient Details  Name: [name] MRN: [id number] Date of Birth: [birth year] Referring Provider: Santiago Glad, MD  Encounter Date: 11/03/2015      PT End of Session - 11/03/15 0844    Visit Number 3   Number of Visits 4   Date for PT Re-Evaluation 11/02/15 (new POC requested: 12/01/15)   Authorization Type BCBS   PT Start Time 0802   PT Stop Time 0841   PT Time Calculation (min) 39 min   Equipment Utilized During Treatment --  min guard to S for safety   Activity Tolerance Other (comment)  limited by nausea   Behavior During Therapy [facility] for tasks assessed/performed      Past Medical History  Diagnosis Date  . Cervical cancer (HCC) 2015  . Cancer (HCC)   . Allergy     Past Surgical History  Procedure Laterality Date  . Breast surgery    . Abdominal hysterectomy    . Hernia repair      There were no vitals filed for this visit.      Subjective Assessment - 11/03/15 0805    Subjective Pt reported the more she does the exercises, the more dizzy she feels. She also reports she feels dizzy when performing the habituation.    Pertinent History Cervical cancer 2015 s/p radical hysterectomy; multiple food allergies, chronic migraines   Patient Stated Goals resolve dizziness; wants to exercise to lose weight, play with 32 y/o daughter   Currently in Pain? No/denies                Vestibular Assessment - 11/03/15 0821    Dix-Hallpike Right   Dix-Hallpike Right Duration 3/10 dizziness   Dix-Hallpike Right Symptoms Upbeat, right rotatory nystagmus   Dix-Hallpike Left   Dix-Hallpike Left Duration Pt reported slight dizziness but no nystagmus noted.   Dix-Hallpike Left Symptoms No nystagmus      Neuro re-ed: Pt performed previous habituation and balance HEP, with cues for  technique. Performed with min guard to S for safety.   PT also provided pt with levator scap stretch to reduce neck tension.            Vestibular Treatment/Exercise - 11/03/15 4034    Vestibular Treatment/Exercise   Vestibular Treatment Provided Canalith Repositioning   Canalith Repositioning Epley Manuever Right    EPLEY MANUEVER RIGHT   Number of Reps  2   Overall Response Improved Symptoms   Response Details  Pt reported wooziness but no spinning during second rep. Pt required cold washcloth to reduce nausea. Pt reported 1.5/10 during second rep.                PT Education - 11/03/15 0844    Education provided Yes   Education Details Levator scap stretch to decr. neck tension. PT also reviewed previous HEP.   Person(s) Educated Patient   Methods Explanation;Demonstration;Verbal cues;Handout   Comprehension Returned demonstration;Verbalized understanding             PT Long Term Goals - 10/05/15 1209    PT LONG TERM GOAL #1   Title demonstrate negative positional testing (11/02/15)   Time 4   Period Weeks   Status New   PT LONG TERM GOAL #2   Title independent with HEP if needed (11/02/15)   Time 4   Period Weeks  Status New               Plan - 11/03/15 0845    Clinical Impression Statement R Dix-Hallpike testing positive for dizziness and symptoms consistent with R pBPPV, which improved after Epleys treatment. L Dix-Hallpike negative for sx's and dizziness, indicating L pBPPV is cleared. Pt required cues for proper technique during balance and habituation HEP. Pt continues to experience incr. postural sway during activities which require incr. vestibular input. PT requesting additional 1x/week for 4 weeks to resolve vertigo and dizziness.    Rehab Potential Excellent   PT Frequency 1x / week   PT Duration 4 weeks   PT Treatment/Interventions ADLs/Self Care Home Management;Vestibular;Canalith Repostioning;Patient/family education;Neuromuscular  re-education;Balance training;Therapeutic exercise;Therapeutic activities;Functional mobility training   PT Next Visit Plan reassess BPPV, dynamic gait training and review HEP as needed.   PT Home Exercise Plan Habituation and balance HEP.   Consulted and Agree with Plan of Care Patient      Patient will benefit from skilled therapeutic intervention in order to improve the following deficits and impairments:  Dizziness, Decreased mobility  Visit Diagnosis: BPPV (benign paroxysmal positional vertigo), left - Plan: PT plan of care cert/re-cert  Other abnormalities of gait and mobility - Plan: PT plan of care cert/re-cert  Dizziness and giddiness - Plan: PT plan of care cert/re-cert  BPPV (benign paroxysmal positional vertigo), right     Problem List Patient Active Problem List   Diagnosis Date Noted  . OSA (obstructive sleep apnea) 03/13/2015  . Morbid obesity (HCC) 03/13/2015    [name] L 11/03/2015, 11:37 AM   [facility] [address] [place], [place], [address] Phone: [phone]   Fax:  [phone]  Name: [name] MRN: [id number] Date of Birth: Apr 16, 1971    Zerita Boers, PT,DPT 11/03/2015 11:37 AM Phone: [phone] Fax: [phone]

## 2015-11-03 NOTE — Patient Instructions (Addendum)
Levator Scapula Stretch, Sitting    Sit, one hand at your side, other hand over top of head. Turn head toward other side and look down at your left armpit. Use hand on head to gently stretch neck in that position. Hold _30__ seconds. Repeat _3__ times per session. Do _2-3__ sessions per day.  Copyright  VHI. All rights reserved.    Sit to Side-Lying    Sit on edge of bed. 1. Turn head 45 to right. 2. Maintain head position and lie down slowly on left side. Hold until symptoms subside plus 30 seconds.. 3. Sit up slowly. Hold until symptoms subside. 4. Turn head 45 to left. 5. Maintain head position and lie down slowly on right side. Hold until symptoms subside plus 30 seconds. 6. Sit up slowly. Repeat sequence __3__ times per session. Do __1-2__ sessions per day.  Copyright  VHI. All rights reserved.    Side to Side Head Motion    Perform without assistive device. Walking on solid surface, turn head and eyes to left for __2__ steps. Then, turn head and eyes to opposite side for __2__ steps. Repeat sequence _4___ times per session. Do _1___ sessions per day.   Copyright  VHI. All rights reserved.  Up / Down Head Motion    Perform without assistive device. Walking on solid surface, move head and eyes toward ceiling for __2__ steps. Then, move head and eyes toward floor for __2__ steps. Repeat __4__ times per session. Do __1__ sessions per day.   Copyright  VHI. All rights reserved.    Feet Together (Compliant Surface) Head Motion - Eyes Closed    Stand on compliant surface: __pillow/cushion______ with feet together. Close eyes and move head slowly, up and down 5 times and side to side 5 times. Repeat __3__ times per session. Do __1__ sessions per day.  Copyright  VHI. All rights reserved.  Feet Together (Compliant Surface) Varied Arm Positions - Eyes Closed    Stand on compliant surface: ___pillow/cushion_____ with feet together and arms at your side. Close  eyes and visualize upright position. Hold__30__ seconds. Repeat _3___ times per session. Do __1__ sessions per day.  Copyright  VHI. All rights reserved.

## 2015-11-19 ENCOUNTER — Ambulatory Visit: Payer: BLUE CROSS/BLUE SHIELD | Attending: Specialist

## 2015-11-19 DIAGNOSIS — R42 Dizziness and giddiness: Secondary | ICD-10-CM | POA: Diagnosis not present

## 2015-11-19 DIAGNOSIS — R2689 Other abnormalities of gait and mobility: Secondary | ICD-10-CM | POA: Diagnosis not present

## 2015-11-19 DIAGNOSIS — F411 Generalized anxiety disorder: Secondary | ICD-10-CM | POA: Diagnosis not present

## 2015-11-19 DIAGNOSIS — H8112 Benign paroxysmal vertigo, left ear: Secondary | ICD-10-CM | POA: Diagnosis not present

## 2015-11-19 NOTE — Therapy (Signed)
[facility] [address] [place], [place], [address] Phone: [phone]   Fax:  [phone]  Physical Therapy Treatment  Patient Details  Name: [name] MRN: [id number] Date of Birth: 1971-05-13 Referring Provider: Santiago Glad, MD  Encounter Date: 11/19/2015      PT End of Session - 11/19/15 0844    Visit Number 4   Number of Visits 8   Date for PT Re-Evaluation 12/01/15   Authorization Type BCBS   PT Start Time 0809  pt late   PT Stop Time 0841   PT Time Calculation (min) 32 min   Equipment Utilized During Treatment Gait belt   Activity Tolerance Patient tolerated treatment well   Behavior During Therapy [facility] for tasks assessed/performed      Past Medical History  Diagnosis Date  . Cervical cancer (HCC) 2015  . Cancer (HCC)   . Allergy     Past Surgical History  Procedure Laterality Date  . Breast surgery    . Abdominal hysterectomy    . Hernia repair      There were no vitals filed for this visit.      Subjective Assessment - 11/19/15 0812    Subjective Pt reported she felt better last session, until she went to a yoga class a few days ago and became very dizzy. Pt took Meclizine for a few days and it felt better.    Pertinent History Cervical cancer 2015 s/p radical hysterectomy; multiple food allergies, chronic migraines   Patient Stated Goals resolve dizziness; wants to exercise to lose weight, play with 31 y/o daughter   Currently in Pain? No/denies                Vestibular Assessment - 11/19/15 0815    Dix-Hallpike Right   Dix-Hallpike Right Duration none   Dix-Hallpike Right Symptoms No nystagmus   Dix-Hallpike Left   Dix-Hallpike Left Duration 1.5/10 dizziness    Dix-Hallpike Left Symptoms No nystagmus                 [facility] Adult PT Treatment/Exercise - 11/19/15 7253    Ambulation/Gait   Ambulation/Gait Yes   Ambulation/Gait Assistance 5: Supervision   Ambulation/Gait Assistance Details Pt performed head turns/nods while amb. over even/uneven terrain with S to ensure safety. No overt LOB.   Ambulation Distance (Feet) 245 Feet  indoors and 400' outdoors   Assistive device None   Gait Pattern Step-through pattern;Decreased stride length         Vestibular Treatment/Exercise - 11/19/15 0820     EPLEY MANUEVER LEFT   Number of Reps  1   Overall Response  Improved Symptoms    RESPONSE DETAILS LEFT Pt reported no dizziness.             Balance Exercises - 11/19/15 0843    Balance Exercises: Standing   Rockerboard Anterior/posterior;Lateral;Head turns;EO;EC;10 seconds;30 seconds;10 reps  no UE support   Other Standing Exercises Pt performed on bosu and rockerboard with no incr. in dizziness. and min guard to S for safety. Cues for technique.           PT Education - 11/19/15 0844    Education provided Yes   Education Details PT discussed the importance of continuing HEP.   Person(s) Educated Patient   Methods Explanation   Comprehension Verbalized understanding             PT Long Term Goals - 11/19/15 0846    PT LONG TERM  GOAL #1   Title demonstrate negative positional testing (11/02/15) NEW POC: 12/01/15   Time 4   Period Weeks   Status On-going   PT LONG TERM GOAL #2   Title independent with HEP if needed (11/02/15)   Time 4   Period Weeks   Status On-going               Plan - 11/19/15 0845    Clinical Impression Statement Pt demonstrated progress as B Dix-Hallpike negative for nystagmus, but pt did experience dizziness during L Dix-Hallpike testing-consistent with L pBPPV, which resolved with L epleys treatment. Pt would continue to benefit from skilled PT to improve vertigo and safety during functional mobility.   Rehab Potential Excellent   PT Frequency 1x / week   PT Duration 4 weeks   PT Treatment/Interventions ADLs/Self Care Home Management;Vestibular;Canalith Repostioning;Patient/family  education;Neuromuscular re-education;Balance training;Therapeutic exercise;Therapeutic activities;Functional mobility training   PT Next Visit Plan reassess BPPV, dynamic gait training and review HEP as needed and d/c   PT Home Exercise Plan Habituation and balance HEP.   Consulted and Agree with Plan of Care Patient      Patient will benefit from skilled therapeutic intervention in order to improve the following deficits and impairments:  Dizziness, Decreased mobility  Visit Diagnosis: BPPV (benign paroxysmal positional vertigo), left  Other abnormalities of gait and mobility  Dizziness and giddiness     Problem List Patient Active Problem List   Diagnosis Date Noted  . OSA (obstructive sleep apnea) 03/13/2015  . Morbid obesity (HCC) 03/13/2015    Jama Mcmiller L 11/19/2015, 8:47 AM  [facility] [address] [place], [place], [address] Phone: [phone]   Fax:  [phone]  Name: Charlin Deantonio MRN: [id number] Date of Birth: 1970/08/04    Zerita Boers, PT,DPT 11/19/2015 8:47 AM Phone: [phone] Fax: [phone]

## 2015-11-21 DIAGNOSIS — G4710 Hypersomnia, unspecified: Secondary | ICD-10-CM | POA: Diagnosis not present

## 2015-11-21 DIAGNOSIS — G4733 Obstructive sleep apnea (adult) (pediatric): Secondary | ICD-10-CM | POA: Diagnosis not present

## 2015-11-26 DIAGNOSIS — F331 Major depressive disorder, recurrent, moderate: Secondary | ICD-10-CM | POA: Diagnosis not present

## 2015-12-15 ENCOUNTER — Ambulatory Visit: Payer: BLUE CROSS/BLUE SHIELD

## 2015-12-17 DIAGNOSIS — F331 Major depressive disorder, recurrent, moderate: Secondary | ICD-10-CM | POA: Diagnosis not present

## 2015-12-22 DIAGNOSIS — H903 Sensorineural hearing loss, bilateral: Secondary | ICD-10-CM | POA: Diagnosis not present

## 2015-12-22 DIAGNOSIS — H9313 Tinnitus, bilateral: Secondary | ICD-10-CM | POA: Diagnosis not present

## 2015-12-22 DIAGNOSIS — G4710 Hypersomnia, unspecified: Secondary | ICD-10-CM | POA: Diagnosis not present

## 2015-12-22 DIAGNOSIS — R42 Dizziness and giddiness: Secondary | ICD-10-CM | POA: Diagnosis not present

## 2015-12-22 DIAGNOSIS — G4733 Obstructive sleep apnea (adult) (pediatric): Secondary | ICD-10-CM | POA: Diagnosis not present

## 2015-12-25 ENCOUNTER — Ambulatory Visit (INDEPENDENT_AMBULATORY_CARE_PROVIDER_SITE_OTHER): Payer: BLUE CROSS/BLUE SHIELD | Admitting: Pulmonary Disease

## 2015-12-25 ENCOUNTER — Encounter: Payer: Self-pay | Admitting: Pulmonary Disease

## 2015-12-25 VITALS — BP 118/82 | HR 75 | Ht 64.0 in | Wt 217.6 lb

## 2015-12-25 DIAGNOSIS — G4733 Obstructive sleep apnea (adult) (pediatric): Secondary | ICD-10-CM

## 2015-12-25 DIAGNOSIS — Z9989 Dependence on other enabling machines and devices: Principal | ICD-10-CM

## 2015-12-25 NOTE — Progress Notes (Signed)
Current Outpatient Prescriptions on File Prior to Visit  Medication Sig  . cholecalciferol (VITAMIN D) 1000 UNITS tablet Take 1,000 Units by mouth daily.  Marland Kitchen DIVIGEL 1 MG/GM GEL PLACE 1 MG ONTO THE SKIN D  . Multiple Vitamin (MULTIVITAMIN WITH MINERALS) TABS tablet Take 1 tablet by mouth daily.   No current facility-administered medications on file prior to visit.      Chief Complaint  Patient presents with  . Follow-up    Wears CPAP nightly. Pt needs new mask and supplies - mask is not sealing and making loud noises. DME: [employer]     Sleep tests HST 02/24/15 >> AHI 9.4, SaO2 low 76% Auto CPAP 09/25/15 to 12/23/15 >> used on 86 of 90 nights with average 7 hrs 32 min.  Average AHI 0.6 with median CPAP 7 and 95 th percentile CPAP 10 cm H2O  Past medical hx Cervical cancer 2015, Anxiety  Past surgical hx, Allergies, Family hx, Social hx all reviewed.  Vital Signs BP 118/82 (BP Location: Left Arm, Cuff Size: Normal)   Pulse 75   Ht 5\' 4"  (1.626 m)   Wt 217 lb 9.6 oz (98.7 kg)   SpO2 96%   BMI 37.35 kg/m   History of Present Illness [name] is a 45 y.o. female with obstructive sleep apnea.  She has been sleeping much better since getting CPAP.  She has full face mask.  She has noticed her mask liner is leaking.  She has not received a new mask since original set up.  She entered a weight loss program and has lost about 5 lbs.  She recently moved and got a new puppy.  Physical Exam  General - No distress ENT - No sinus tenderness, no oral exudate, no LAN, MP 4, scalloped tongue Cardiac - s1s2 regular, no murmur Chest - No wheeze/rales/dullness Back - No focal tenderness Abd - Soft, non-tender Ext - No edema Neuro - Normal strength Skin - No rashes Psych - normal mood, and behavior   Assessment/Plan  Obstructive sleep apnea. - she is compliant with CPAP and reports benefit from therapy - continue auto CPAP  Obesity. - encouraged her to keep up with her weight  loss efforts - explained that if she loses enough weight, then we could re-assess whether she needs to continue CPAP   Patient Instructions  Follow up in 1 year    Chesley Mires, MD [place] Pager:  [phone] 12/25/2015, 10:04 AM

## 2015-12-25 NOTE — Patient Instructions (Signed)
Follow up in 1 year.

## 2015-12-29 ENCOUNTER — Ambulatory Visit: Payer: BLUE CROSS/BLUE SHIELD

## 2015-12-31 DIAGNOSIS — F331 Major depressive disorder, recurrent, moderate: Secondary | ICD-10-CM | POA: Diagnosis not present

## 2016-01-07 DIAGNOSIS — F411 Generalized anxiety disorder: Secondary | ICD-10-CM | POA: Diagnosis not present

## 2016-01-13 ENCOUNTER — Ambulatory Visit (INDEPENDENT_AMBULATORY_CARE_PROVIDER_SITE_OTHER): Payer: Self-pay | Admitting: Physician Assistant

## 2016-01-13 VITALS — BP 142/70 | HR 80 | Temp 98.0°F | Resp 16 | Ht 65.0 in | Wt 215.8 lb

## 2016-01-13 DIAGNOSIS — N3001 Acute cystitis with hematuria: Secondary | ICD-10-CM

## 2016-01-13 DIAGNOSIS — R300 Dysuria: Secondary | ICD-10-CM

## 2016-01-13 LAB — POCT URINALYSIS DIP (MANUAL ENTRY)
BILIRUBIN UA: NEGATIVE
Bilirubin, UA: NEGATIVE
Glucose, UA: NEGATIVE
Nitrite, UA: NEGATIVE
PH UA: 5.5
SPEC GRAV UA: 1.02
Urobilinogen, UA: 0.2

## 2016-01-13 LAB — POC MICROSCOPIC URINALYSIS (UMFC): Mucus: ABSENT

## 2016-01-13 MED ORDER — CIPROFLOXACIN HCL 250 MG PO TABS: 250.0000 mg | ORAL_TABLET | Freq: Two times a day (BID) | ORAL | 0 refills | Status: AC

## 2016-01-13 NOTE — Patient Instructions (Addendum)
Urinary Tract Infection Urinary tract infections (UTIs) can develop anywhere along your urinary tract. Your urinary tract is your body's drainage system for removing wastes and extra water. Your urinary tract includes two kidneys, two ureters, a bladder, and a urethra. Your kidneys are a pair of bean-shaped organs. Each kidney is about the size of your fist. They are located below your ribs, one on each side of your spine. CAUSES Infections are caused by microbes, which are microscopic organisms, including fungi, viruses, and bacteria. These organisms are so small that they can only be seen through a microscope. Bacteria are the microbes that most commonly cause UTIs. SYMPTOMS  Symptoms of UTIs may vary by age and gender of the patient and by the location of the infection. Symptoms in young women typically include a frequent and intense urge to urinate and a painful, burning feeling in the bladder or urethra during urination. Older women and men are more likely to be tired, shaky, and weak and have muscle aches and abdominal pain. A fever may mean the infection is in your kidneys. Other symptoms of a kidney infection include pain in your back or sides below the ribs, nausea, and vomiting. DIAGNOSIS To diagnose a UTI, your caregiver will ask you about your symptoms. Your caregiver will also ask you to provide a urine sample. The urine sample will be tested for bacteria and white blood cells. White blood cells are made by your body to help fight infection. TREATMENT  Typically, UTIs can be treated with medication. Because most UTIs are caused by a bacterial infection, they usually can be treated with the use of antibiotics. The choice of antibiotic and length of treatment depend on your symptoms and the type of bacteria causing your infection. HOME CARE INSTRUCTIONS  If you were prescribed antibiotics, take them exactly as your caregiver instructs you. Finish the medication even if you feel better after  you have only taken some of the medication.  Drink enough water and fluids to keep your urine clear or pale yellow.  Avoid caffeine, tea, and carbonated beverages. They tend to irritate your bladder.  Empty your bladder often. Avoid holding urine for long periods of time.  Empty your bladder before and after sexual intercourse.  After a bowel movement, women should cleanse from front to back. Use each tissue only once. SEEK MEDICAL CARE IF:   You have back pain.  You develop a fever.  Your symptoms do not begin to resolve within 3 days. SEEK IMMEDIATE MEDICAL CARE IF:   You have severe back pain or lower abdominal pain.  You develop chills.  You have nausea or vomiting.  You have continued burning or discomfort with urination. MAKE SURE YOU:   Understand these instructions.  Will watch your condition.  Will get help right away if you are not doing well or get worse.   This information is not intended to replace advice given to you by your health care provider. Make sure you discuss any questions you have with your health care provider.   Document Released: 02/09/2005 Document Revised: 01/21/2015 Document Reviewed: 06/10/2011 Elsevier Interactive Patient Education 2016 [employer].     IF you received an x-ray today, you will receive an invoice from [facility]. Please contact [facility] at [phone] with questions or concerns regarding your invoice.   IF you received labwork today, you will receive an invoice from [employer]. Please contact [facility] at [phone] with questions or concerns regarding your invoice.  Our billing staff will not be able to assist you with questions regarding bills from these companies.  You will be contacted with the lab results as soon as they are available. The fastest way to get your results is to activate your My Chart account. Instructions are located on the last page of this  paperwork. If you have not heard from [place] regarding the results in 2 weeks, please contact this office.

## 2016-01-13 NOTE — Progress Notes (Signed)
Patient ID: [name], female   DOB: [birth year], 45 y.o.   MRN: [id number] [facility] [address] [place] [address] [phone]  Date:  01/13/2016   Name:  [name]   DOB:  Aug 10, 1970   MRN:  [id number]  PCP:  Jaynee Eagles, PA-C   By signing my name below, I, Ladene Artist, attest that this documentation has been prepared under the direction and in the presence of Ivar Drape, PA-C Electronically Signed: Ladene Artist, ED Scribe 01/13/2016 at 8:51 AM.  History of Present Illness:  [name] is a 45 y.o. female patient who presents to [facility] complaining of urinary frequency for the past few days, worsened since last night. Pt reports associated symptoms of hematuria onset last night, dysuria, low back pain and urinary retention. She denies fever, nausea and vaginal discharge. Pt reports that her last UTI was in January and suspects that symptoms are due to consuming a lot of processed foods. She reports a h/o cervical CA. Pt has a follow-up appointment with her oncologist in October.   Patient Active Problem List   Diagnosis Date Noted   OSA (obstructive sleep apnea) 03/13/2015   Morbid obesity ([place]) 03/13/2015    Past Medical History:  Diagnosis Date   Allergy    Cancer ([place])    Cervical cancer ([place]) 2015    Past Surgical History:  Procedure Laterality Date   ABDOMINAL HYSTERECTOMY     BREAST SURGERY     HERNIA REPAIR      Social History  Substance Use Topics   Smoking status: Former Smoker    Quit date: 04/26/1998   Smokeless tobacco: Never Used   Alcohol use No     Comment: scial    Family History  Problem Relation Age of Onset   Cancer Father     Allergies  Allergen Reactions   Amoxicillin     Rash    Eggs Or Egg-Derived Products Diarrhea   Other Diarrhea and Other (See Comments)    Nuts, dairy, lettuce, peanuts, gluten, corn and wheat   Penicillins     Rash    Sulfa Antibiotics     Rash      Medication list has been reviewed and updated.  Current Outpatient Prescriptions on File Prior to Visit  Medication Sig Dispense Refill   cholecalciferol (VITAMIN D) 1000 UNITS tablet Take 1,000 Units by mouth daily.     DIVIGEL 1 MG/GM GEL PLACE 1 MG ONTO THE SKIN D  3   Multiple Vitamin (MULTIVITAMIN WITH MINERALS) TABS tablet Take 1 tablet by mouth daily.     No current facility-administered medications on file prior to visit.     Review of Systems  Constitutional: Negative for fever.  Gastrointestinal: Negative for nausea.  Genitourinary: Positive for dysuria, frequency and hematuria.  Musculoskeletal: Positive for back pain.    Physical Examination: BP (!) 142/70 (BP Location: Right Arm, Patient Position: Sitting, Cuff Size: Large)    Pulse 80    Temp 98 F (36.7 C) (Oral)    Resp 16    Ht 5\' 5"  (1.651 m)    Wt 215 lb 12.8 oz (97.9 kg)    SpO2 97%    BMI 35.91 kg/m  Ideal Body Weight: @FLOWAMB [id number]  Physical Exam  Constitutional: She is oriented to person, place, and time. She appears well-developed and well-nourished. No distress.  HENT:  Head: Normocephalic and atraumatic.  Right Ear: External ear normal.  Left Ear: External ear normal.  Eyes: Conjunctivae and EOM are normal. Pupils are equal, round, and reactive to light.  Cardiovascular: Normal rate and regular rhythm.  Exam reveals no gallop and no friction rub.   No murmur heard. Pulmonary/Chest: Effort normal and breath sounds normal. No respiratory distress. She has no wheezes. She has no rhonchi. She has no rales.  Abdominal: Bowel sounds are normal. There is tenderness in the suprapubic area. There is no CVA tenderness.  Neurological: She is alert and oriented to person, place, and time.  Skin: She is not diaphoretic.  Psychiatric: She has a normal mood and affect. Her behavior is normal.    Assessment and Plan: [name] is a 45 y.o. female who is here today for urinary pain.  --will  treat at this time.  Urine culture obtained.   --advised nsaid for pain.    Acute cystitis with hematuria - Plan: ciprofloxacin (CIPRO) 250 MG tablet  Dysuria - Plan: POCT Microscopic Urinalysis ([facility]), POCT urinalysis dipstick, ciprofloxacin (CIPRO) 250 MG tablet, Urine culture  Ivar Drape, PA-C [facility] 01/13/2016 8:27 AM

## 2016-01-14 DIAGNOSIS — F331 Major depressive disorder, recurrent, moderate: Secondary | ICD-10-CM | POA: Diagnosis not present

## 2016-01-15 ENCOUNTER — Ambulatory Visit: Payer: BLUE CROSS/BLUE SHIELD | Attending: Specialist | Admitting: Physical Therapy

## 2016-01-15 DIAGNOSIS — R2689 Other abnormalities of gait and mobility: Secondary | ICD-10-CM | POA: Diagnosis not present

## 2016-01-15 DIAGNOSIS — R42 Dizziness and giddiness: Secondary | ICD-10-CM | POA: Diagnosis not present

## 2016-01-15 DIAGNOSIS — H8111 Benign paroxysmal vertigo, right ear: Secondary | ICD-10-CM | POA: Diagnosis not present

## 2016-01-15 LAB — URINE CULTURE: Colony Count: >=100000

## 2016-01-15 NOTE — Patient Instructions (Signed)
Tip Card 1.The goal of habituation training is to assist in decreasing symptoms of vertigo, dizziness, or nausea provoked by specific head and body motions. 2.These exercises may initially increase symptoms; however, be persistent and work through symptoms. With repetition and time, the exercises will assist in reducing or eliminating symptoms. 3.Exercises should be stopped and discussed with the therapist if you experience any of the following: - Sudden change or fluctuation in hearing - New onset of ringing in the ears, or increase in current intensity - Any fluid discharge from the ear - Severe pain in neck or back - Extreme nausea   Sit to Side-Lying   Sit on edge of bed. Lie down onto the right side and hold until dizziness stops, plus 20 seconds.  Return to sitting and wait until dizziness stops, plus 20 seconds.  Repeat to the left side. Repeat sequence 5 times per session. Do 2 sessions per day. 

## 2016-01-16 NOTE — Therapy (Signed)
[facility] [address] [place], [place], [address] Phone: [phone]   Fax:  [phone]  Physical Therapy Treatment  Patient Details  Name: [name] MRN: [id number] Date of Birth: 1970-11-29 Referring Provider: Santiago GladMarshall Freeman, MD  Encounter Date: 01/15/2016      PT End of Session - 01/16/16 1044    Visit Number 1   Number of Visits 6  eval + 5 visits   Date for PT Re-Evaluation 02/14/16   Authorization Type BCBS   PT Start Time 1319   PT Stop Time 1358   PT Time Calculation (min) 39 min   Activity Tolerance Other (comment)  Limited by nausea   Behavior During Therapy [facility] for tasks assessed/performed      Past Medical History:  Diagnosis Date  . Allergy   . Cancer (H[facility])   . Cervical cancer (H[facility]) 2015    Past Surgical History:  Procedure Laterality Date  . ABDOMINAL HYSTERECTOMY    . BREAST SURGERY    . HERNIA REPAIR      There were no vitals filed for this visit.      Subjective Assessment - 01/15/16 1325    Subjective "I was doing great, and [place] then I woke up with the vertigo again about 9 days ago."   Pertinent History Cervical cancer 2015 s/p radical hysterectomy; multiple food allergies, chronic migraines   Patient Stated Goals resolve dizziness; wants to exercise to lose weight, play with 658 y/o daughter   Currently in Pain? No/denies            [facility] PT Assessment - 01/16/16 0001      Assessment   Medical Diagnosis veritog   Referring Provider Santiago GladMarshall Freeman, MD   Onset Date/Surgical Date 01/06/16  onset of recent acute vertiginous episode     Precautions   Precautions None     Restrictions   Weight Bearing Restrictions No     Balance Screen   Has the patient fallen in the past 6 months No   Has the patient had a decrease in activity level because of a fear of falling?  Yes   Is the patient reluctant to leave their home  because of a fear of falling?  No     Home [employer]   Living Environment Private residence   Living Arrangements Children     Prior Function   Level of Independence Independent   Vocation Part time employment   Vocation Requirements middle school teacher; social studies     Cognition   Overall Cognitive Status Within Functional Limits for tasks assessed     Observation/Other Assessments   Dizziness Handicap Inventory [facility](DHI)  46  taken on 5/22            Vestibular Assessment - 01/16/16 1036      Symptom Behavior   Type of Dizziness Spinning   Frequency of Dizziness daily   Duration of Dizziness seconds   Aggravating Factors Lying supine;Looking up to the ceiling   Relieving Factors Head stationary     Positional Testing   Dix-Hallpike Dix-Hallpike Right   Sidelying Test Sidelying Right;Sidelying Left   Horizontal Canal Testing Horizontal Canal Right;Horizontal Canal Left     Dix-Hallpike Right   Dix-Hallpike Right Duration 15 seconds  5-second latency   Dix-Hallpike Right Symptoms Upbeat, right rotatory nystagmus     Sidelying Right   Sidelying Right Duration Difficult to visualize due to short  duration of nystagmus, pt head movement; concordant dizziness in this position   Sidelying Right Symptoms Other (comment)  difficult to visualize directional/torsional components     Sidelying Left   Sidelying Left Duration NA   Sidelying Left Symptoms No nystagmus     Horizontal Canal Right   Horizontal Canal Right Duration NA   Horizontal Canal Right Symptoms Normal     Horizontal Canal Left   Horizontal Canal Left Duration NA   Horizontal Canal Left Symptoms Normal                  Vestibular Treatment/Exercise - 01/16/16 0001      Vestibular Treatment/Exercise   Vestibular Treatment Provided Canalith Repositioning   Canalith Repositioning Epley Manuever Right   Habituation Exercises Brandt Daroff      EPLEY MANUEVER RIGHT   Number of Reps  1    Response Details  Reassessment of R sidelying test (-) for nystagmus and true vertigo; however, nausea increased after [place]   Number of Reps  1   Symptom Description  Verbal/demo cueing for technique; performed only 1 rep due to increased time required for nausea to settle between reps               PT Education - 01/16/16 1047    Education provided Yes   Education Details Pt eval findings, goals, and POC. Reviewed BPPV and what to expect after this session. Initiated HEP for habituation.    Person(s) Educated Patient   Methods Explanation;Demonstration;Verbal cues;Handout   Comprehension Verbalized understanding;Returned demonstration          PT Short Term Goals - 01/16/16 1047      PT SHORT TERM GOAL #1   Title STG's = LTG's           PT Long Term Goals - 01/16/16 1047      PT LONG TERM GOAL #1   Title Positional vertigo testing will be negative to indicate resolved BPPV.  (Target date: 02/12/16)   Status New     PT LONG TERM GOAL #2   Title Pt will demonstrate independence with vestibular HEP to maximize functional gains made in PT.  (02/12/16)   Status New     PT LONG TERM GOAL #3   Title Pt will decrease DHI score from 46 to < / = 28 to indicate significant decrease in pt-perceived disability due to dizziness. (02/12/16)   Status New     PT LONG TERM GOAL #4   Title Pt will verbalize understanding of management of BPPV due to history of episodic vertigo.  (02/12/16)   Status New     PT LONG TERM GOAL #5   Title Assess balance and dynaimc gait stability, if indicated, after vertigo clears.  (02/12/16)   Status New               Plan - 01/16/16 1052    Clinical Impression Statement Pt is a 45 y/o F returning to vestibular PT due to reoccurence of vertigo (onset of most recent episode on 01/06/16). PMH significant for : cervical cancer 2015 s/p radical hysterectomy; multiple food allergies, chronic migraines. PT evaluation  reveals R upbeating torsional nystagmus x15 seconds accompanied by vertignous symptoms. Performed R Epley x1; reassessment of R Sidelying Test (-) for nystagmus and vertigo. Initiated HEP for habituation. Further vestibular/oculomotor assessment limited by significant nause; will further assess, as indicated, as pt tolerance to head/body movement improves. Pt will  benefit from skilled vestibular PT 2x/week for 1 week followed by 1x/week for 3 additional weeks to address said impairments.    Rehab Potential Good   PT Frequency Other (comment)  2x/week for 1 week, then 1x/week for 3 weeks   PT Duration --  See above.   PT Treatment/Interventions ADLs/Self Care Home Management;Vestibular;Canalith Repostioning;Patient/family education;Neuromuscular re-education;Balance training;Therapeutic exercise;Therapeutic activities;Functional mobility training   PT Next Visit Plan Reassess BPPV (R PC) and treat prn. Check pt techique with Nestor Lewandowsky. Assess VOR and add gaze to HEP, if indicated. Prior to DC, educate pt on use of habituation to self-manage symptoms of episodic BPPV in future.   PT [place] for habituation   Consulted and Agree with Plan of Care Patient      Patient will benefit from skilled therapeutic intervention in order to improve the following deficits and impairments:  Dizziness  Visit Diagnosis: BPPV (benign paroxysmal positional vertigo), right - Plan: PT plan of care cert/re-cert  Dizziness and giddiness - Plan: PT plan of care cert/re-cert     Problem List Patient Active Problem List   Diagnosis Date Noted  . OSA (obstructive sleep apnea) 03/13/2015  . Morbid obesity ([place]) 03/13/2015   [name], PT, DPT [facility] [address] [place], [place], [address] Phone: [phone]   Fax:  [phone] 01/16/16, 11:01 AM   Name: Chasty Stamant MRN: [id number] Date of Birth: 08-24-1970

## 2016-01-21 DIAGNOSIS — F5089 Other specified eating disorder: Secondary | ICD-10-CM | POA: Diagnosis not present

## 2016-01-26 ENCOUNTER — Ambulatory Visit: Payer: BLUE CROSS/BLUE SHIELD

## 2016-01-26 DIAGNOSIS — G43719 Chronic migraine without aura, intractable, without status migrainosus: Secondary | ICD-10-CM | POA: Diagnosis not present

## 2016-01-26 DIAGNOSIS — R2689 Other abnormalities of gait and mobility: Secondary | ICD-10-CM | POA: Diagnosis not present

## 2016-01-26 DIAGNOSIS — R42 Dizziness and giddiness: Secondary | ICD-10-CM

## 2016-01-26 DIAGNOSIS — M791 Myalgia: Secondary | ICD-10-CM | POA: Diagnosis not present

## 2016-01-26 DIAGNOSIS — G518 Other disorders of facial nerve: Secondary | ICD-10-CM | POA: Diagnosis not present

## 2016-01-26 DIAGNOSIS — H8111 Benign paroxysmal vertigo, right ear: Secondary | ICD-10-CM

## 2016-01-26 DIAGNOSIS — M542 Cervicalgia: Secondary | ICD-10-CM | POA: Diagnosis not present

## 2016-01-26 NOTE — Therapy (Signed)
[facility] [address] [place], [place], [address] Phone: [phone]   Fax:  [phone]  Physical Therapy Treatment  Patient Details  Name: [name] MRN: [id number] Date of Birth: [birth year] Referring Provider: Santiago Glad, MD  Encounter Date: 01/26/2016      PT End of Session - 01/26/16 1631    Visit Number 2   Number of Visits 6   Date for PT Re-Evaluation 02/14/16   Authorization Type BCBS   PT Start Time 1533   PT Stop Time 1611   PT Time Calculation (min) 38 min   Activity Tolerance Other (comment)  limited by nausea   Behavior During Therapy [facility] for tasks assessed/performed      Past Medical History:  Diagnosis Date  . Allergy   . Cancer (HCC)   . Cervical cancer (HCC) 2015    Past Surgical History:  Procedure Laterality Date  . ABDOMINAL HYSTERECTOMY    . BREAST SURGERY    . HERNIA REPAIR      There were no vitals filed for this visit.      Subjective Assessment - 01/26/16 1534    Subjective Pt reported she was dizzy when getting up OOB and felt spinning and wooziness. She feels like it comes and goes.    Pertinent History Cervical cancer 2015 s/p radical hysterectomy; multiple food allergies, chronic migraines   Patient Stated Goals resolve dizziness; wants to exercise to lose weight, play with 60 y/o daughter   Currently in Pain? Yes   Pain Score --  2-3/10   Pain Location Head   Pain Orientation Posterior   Pain Descriptors / Indicators Headache   Pain Type Chronic pain   Pain Onset More than a month ago   Pain Frequency Intermittent   Aggravating Factors  lack of sleep   Pain Relieving Factors rest and medication                Vestibular Assessment - 01/26/16 1557      Dix-Hallpike Right   Dix-Hallpike Right Duration <10 sec. during second trial after Brandt-Daroff provoked sx's and nystagmus. 4/10 dizziness and 6/10 nausea.   Dix-Hallpike Right Symptoms  Upbeat, right rotatory nystagmus     Dix-Hallpike Left   Dix-Hallpike Left Duration none   Dix-Hallpike Left Symptoms No nystagmus     Sidelying Right   Sidelying Right Duration Pt reported slight dizziness but no nystagmus   Sidelying Right Symptoms No nystagmus     Sidelying Left   Sidelying Left Duration none   Sidelying Left Symptoms No nystagmus                  Vestibular Treatment/Exercise - 01/26/16 1601      Vestibular Treatment/Exercise   Vestibular Treatment Provided Canalith Repositioning   Habituation Exercises Austin Miles      EPLEY MANUEVER RIGHT   Number of Reps  2   Overall Response Improved Symptoms   Response Details  Pt reported no dizziness after second treatment but still nauseated 4/10.      Austin Miles   Number of Reps  1   Symptom Description  Pt reported 4/10 dizziness during R sidelying of Austin Miles exercise. PT then reassessed with R Dix-Hallpike and R epley treatment performed.                PT Education - 01/26/16 1630    Education provided Yes   Education Details PT discussed the importance of performing Brandt-Daroff  for habituation. PT also educated pt on the likelihood of recurrent BPPV based on pt's hx of BPPV.   Person(s) Educated Patient   Methods Explanation;Verbal cues   Comprehension Verbalized understanding          PT Short Term Goals - 01/16/16 1047      PT SHORT TERM GOAL #1   Title STG's = LTG's           PT Long Term Goals - 01/26/16 1633      PT LONG TERM GOAL #1   Title Positional vertigo testing will be negative to indicate resolved BPPV.  (Target date: 02/12/16)   Status On-going     PT LONG TERM GOAL #2   Title Pt will demonstrate independence with vestibular HEP to maximize functional gains made in PT.  (02/12/16)   Status On-going     PT LONG TERM GOAL #3   Title Pt will decrease DHI score from 46 to < / = 28 to indicate significant decrease in pt-perceived disability due to  dizziness. (02/12/16)   Status On-going     PT LONG TERM GOAL #4   Title Pt will verbalize understanding of management of BPPV due to history of episodic vertigo.  (02/12/16)   Status On-going     PT LONG TERM GOAL #5   Title Assess balance and dynaimc gait stability, if indicated, after vertigo clears.  (02/12/16)   Status On-going               Plan - 01/26/16 1631    Clinical Impression Statement Pt demonstrated progress, as dizziness duration and intensity less severe than last session. Pt experienced R upbeating torsional nystagmus for <10 sec. during R Dix-Hallpike, which resolved after Epley treatment. Pt continues to require education on importance of performing HEP in order to improve sx's. Continue with POC.    Rehab Potential Good   PT Frequency Other (comment)  2x/week for 1 week, then 1x/week for 3 weeks   PT Duration --  See above.   PT Treatment/Interventions ADLs/Self Care Home Management;Vestibular;Canalith Repostioning;Patient/family education;Neuromuscular re-education;Balance training;Therapeutic exercise;Therapeutic activities;Functional mobility training   PT Next Visit Plan Reassess BPPV (R PC) and treat prn. Assess VOR and add gaze to HEP, if indicated.    PT Home Exercise Plan Austin Miles for habituation   Consulted and Agree with Plan of Care Patient      Patient will benefit from skilled therapeutic intervention in order to improve the following deficits and impairments:  Dizziness  Visit Diagnosis: BPPV (benign paroxysmal positional vertigo), right  Dizziness and giddiness     Problem List Patient Active Problem List   Diagnosis Date Noted  . OSA (obstructive sleep apnea) 03/13/2015  . Morbid obesity (HCC) 03/13/2015    Tytionna Cloyd L 01/26/2016, 4:34 PM  [facility] [address] [place], [place], [address] Phone: [phone]   Fax:  [phone]  Name: Destry Bourdeau MRN: [id number] Date of Birth: 1970-12-01   Zerita Boers, PT,DPT 01/26/16 4:34 PM Phone: [phone] Fax: [phone]

## 2016-02-02 ENCOUNTER — Ambulatory Visit: Payer: BLUE CROSS/BLUE SHIELD

## 2016-02-02 DIAGNOSIS — R42 Dizziness and giddiness: Secondary | ICD-10-CM

## 2016-02-02 DIAGNOSIS — H8111 Benign paroxysmal vertigo, right ear: Secondary | ICD-10-CM

## 2016-02-02 DIAGNOSIS — R2689 Other abnormalities of gait and mobility: Secondary | ICD-10-CM

## 2016-02-02 NOTE — Therapy (Signed)
[facility] [address] [place], [place], [address] Phone: [phone]   Fax:  [phone]  Physical Therapy Treatment  Patient Details  Name: [name] MRN: [id number] Date of Birth: 02-20-1971 Referring Provider: Santiago Glad, MD  Encounter Date: 02/02/2016      PT End of Session - 02/02/16 1003    Visit Number 3   Number of Visits 6   Date for PT Re-Evaluation 02/14/16   Authorization Type BCBS   PT Start Time [phone]   PT Stop Time 1001  due to pt progress, placing pt on hold   PT Time Calculation (min) 30 min   Equipment Utilized During Treatment --  min guard to S prn   Activity Tolerance Patient tolerated treatment well   Behavior During Therapy [facility] for tasks assessed/performed      Past Medical History:  Diagnosis Date  . Allergy   . Cancer (HCC)   . Cervical cancer (HCC) 2015    Past Surgical History:  Procedure Laterality Date  . ABDOMINAL HYSTERECTOMY    . BREAST SURGERY    . HERNIA REPAIR      There were no vitals filed for this visit.      Subjective Assessment - 02/02/16 0934    Subjective Pt reported she feels so much better this week, and has no dizziness. Pt has been able to read to her dtr (supine in bed), roll over in bed, and turn head without dizziness. Pt reports she's eating less and performing more activities, as dizziness is gone.    Pertinent History Cervical cancer 2015 s/p radical hysterectomy; multiple food allergies, chronic migraines   Patient Stated Goals resolve dizziness; wants to exercise to lose weight, play with 47 y/o daughter            [facility] PT Assessment - 02/02/16 0947      Functional Gait  Assessment   Gait assessed  Yes   Gait Level Surface Walks 20 ft in less than 5.5 sec, no assistive devices, good speed, no evidence for imbalance, normal gait pattern, deviates no more than 6 in outside of the 12 in walkway width.   Change in Gait Speed Able  to smoothly change walking speed without loss of balance or gait deviation. Deviate no more than 6 in outside of the 12 in walkway width.   Gait with Horizontal Head Turns Performs head turns smoothly with no change in gait. Deviates no more than 6 in outside 12 in walkway width   Gait with Vertical Head Turns Performs head turns with no change in gait. Deviates no more than 6 in outside 12 in walkway width.   Gait and Pivot Turn Pivot turns safely within 3 sec and stops quickly with no loss of balance.   Step Over Obstacle Is able to step over 2 stacked shoe boxes taped together (9 in total height) without changing gait speed. No evidence of imbalance.   Gait with Narrow Base of Support Is able to ambulate for 10 steps heel to toe with no staggering.   Gait with Eyes Closed Walks 20 ft, uses assistive device, slower speed, mild gait deviations, deviates 6-10 in outside 12 in walkway width. Ambulates 20 ft in less than 9 sec but greater than 7 sec.   Ambulating Backwards Walks 20 ft, uses assistive device, slower speed, mild gait deviations, deviates 6-10 in outside 12 in walkway width.   Steps Alternating feet, no rail.   Total Score 28  Self Care:      PT Education - 02/02/16 1001    Education provided Yes   Education Details PT reviewed HEP and modified as needed. PT encouraged pt to continue HEP as prescribed. PT placed pt on hold for 2 weeks due to excellent progress. PT encouraged pt to continue activities as tolerated, but to not perform downward dog for at least 1 week, to ensure sx's are gone prior to attempting.    Person(s) Educated Patient   Methods Explanation;Demonstration;Verbal cues;Handout   Comprehension Returned demonstration;Verbalized understanding    Please see HEP for details.       PT Short Term Goals - 01/16/16 1047      PT SHORT TERM GOAL #1   Title STG's = LTG's           PT Long Term Goals - 02/02/16 1005       PT LONG TERM GOAL #1   Title Positional vertigo testing will be negative to indicate resolved BPPV.  (Target date: 02/12/16)   Status On-going     PT LONG TERM GOAL #2   Title Pt will demonstrate independence with vestibular HEP to maximize functional gains made in PT.  (02/12/16)   Status On-going     PT LONG TERM GOAL #3   Title Pt will decrease DHI score from 46 to < / = 28 to indicate significant decrease in pt-perceived disability due to dizziness. (02/12/16)   Status On-going     PT LONG TERM GOAL #4   Title Pt will verbalize understanding of management of BPPV due to history of episodic vertigo.  (02/12/16)   Status On-going     PT LONG TERM GOAL #5   Title Assess balance and dynaimc gait stability, if indicated, after vertigo clears.  (02/12/16)   Status Achieved               Plan - 02/02/16 1003    Clinical Impression Statement Pt demonstrated progress, as she reported 0/10 dizziness during all bed mobility and dynamic gait activities. Pt's FGA score of 28/30 indicates pt is at a low risk for falls. PT placing pt on hold for 2 weeks, due to excellent progress. PT will assess goals next session and d/c.    Rehab Potential Good   PT Frequency Other (comment)  2x/week for 1 week, then 1x/week for 3 weeks   PT Duration --  See above.   PT Treatment/Interventions ADLs/Self Care Home Management;Vestibular;Canalith Repostioning;Patient/family education;Neuromuscular re-education;Balance training;Therapeutic exercise;Therapeutic activities;Functional mobility training   PT Next Visit Plan Assess goals and d/c prn.   PT Home Exercise Plan [place] for habituation   Consulted and Agree with Plan of Care Patient      Patient will benefit from skilled therapeutic intervention in order to improve the following deficits and impairments:  Dizziness  Visit Diagnosis: Dizziness and giddiness  Other abnormalities of gait and mobility  BPPV (benign paroxysmal positional  vertigo), right     Problem List Patient Active Problem List   Diagnosis Date Noted  . OSA (obstructive sleep apnea) 03/13/2015  . Morbid obesity (HCC) 03/13/2015    Nachelle Negrette L 02/02/2016, 10:06 AM  [facility] [address] [place], [place], [address] Phone: [phone]   Fax:  [phone]  Name: [name] MRN: [id number] Date of Birth: 03/17/1971   Zerita Boers, PT,DPT 02/02/16 10:07 AM Phone: [phone] Fax: [phone]

## 2016-02-02 NOTE — Patient Instructions (Signed)
Levator Scapula Stretch, Sitting    Sit, one hand at your side, other hand over top of head. Turn head toward other side and look down at your left armpit. Use hand on head to gently stretch neck in that position. Hold _30__ seconds. Repeat _3__ times per session. Do _2-3__ sessions per day. CONTINUE AS STATED ABOVE. REMEMBER TO NOT SHRUG SHOULDERS.  Copyright  VHI. All rights reserved.    Sit to Side-Lying    Sit on edge of bed. 1. Turn head 45 to right. 2. Maintain head position and lie down slowly on left side. Hold until symptoms subside plus 30 seconds.. 3. Sit up slowly. Hold until symptoms subside. 4. Turn head 45 to left. 5. Maintain head position and lie down slowly on right side. Hold until symptoms subside plus 30 seconds. 6. Sit up slowly. Repeat sequence __3__ times per session. Do __1-2__ sessions per day. STOP THIS WHEN DIZZINESS HAS BEEN GONE FOR TWO DAYS.   Copyright  VHI. All rights reserved.    Side to Side Head Motion    Perform without assistive device. Walking on solid surface, turn head and eyes to left for __2__ steps. Then, turn head and eyes to opposite side for __2__ steps. Repeat sequence _4___ times per session. Do _1___ sessions per day. PERFORM 1-2 TIMES A WEEK.  Copyright  VHI. All rights reserved.  Up / Down Head Motion    Perform without assistive device. Walking on solid surface, move head and eyes toward ceiling for __2__ steps. Then, move head and eyes toward floor for __2__ steps. Repeat __4__ times per session. Do __1__ sessions per day. PERFORM 1-2 TIMES A WEEK.  Copyright  VHI. All rights reserved.    Feet Together (Compliant Surface) Head Motion - Eyes Closed    Stand on compliant surface: __pillow/cushion______ with feet together. Close eyes and move head slowly, up and down 5 times and side to side 5 times. Repeat __3__ times per session. Do __1__ sessions per day. PERFORM 3-4 TIMES A DAY.   Copyright  VHI. All  rights reserved.   Feet Together (Compliant Surface) Varied Arm Positions - Eyes Closed    Stand on compliant surface: ___pillow/cushion_____ with feet together and arms at your side. Close eyes and visualize upright position. Hold__30__ seconds. Repeat _3___ times per session. Do __1__ sessions per day. PERFORM 3-4 TIMES A WEEK.  Copyright  VHI. All rights reserved.

## 2016-02-03 ENCOUNTER — Encounter: Payer: Self-pay | Admitting: Physical Therapy

## 2016-02-04 DIAGNOSIS — F331 Major depressive disorder, recurrent, moderate: Secondary | ICD-10-CM | POA: Diagnosis not present

## 2016-02-04 DIAGNOSIS — F5089 Other specified eating disorder: Secondary | ICD-10-CM | POA: Diagnosis not present

## 2016-02-05 ENCOUNTER — Encounter: Payer: Self-pay | Admitting: Physical Therapy

## 2016-02-12 ENCOUNTER — Encounter: Payer: Self-pay | Admitting: Physical Therapy

## 2016-02-18 DIAGNOSIS — F331 Major depressive disorder, recurrent, moderate: Secondary | ICD-10-CM | POA: Diagnosis not present

## 2016-02-19 ENCOUNTER — Ambulatory Visit: Payer: BLUE CROSS/BLUE SHIELD

## 2016-02-22 DIAGNOSIS — R911 Solitary pulmonary nodule: Secondary | ICD-10-CM | POA: Diagnosis not present

## 2016-02-22 DIAGNOSIS — C539 Malignant neoplasm of cervix uteri, unspecified: Secondary | ICD-10-CM | POA: Diagnosis not present

## 2016-02-22 DIAGNOSIS — R21 Rash and other nonspecific skin eruption: Secondary | ICD-10-CM | POA: Diagnosis not present

## 2016-02-22 DIAGNOSIS — Z9221 Personal history of antineoplastic chemotherapy: Secondary | ICD-10-CM | POA: Diagnosis not present

## 2016-02-22 DIAGNOSIS — Z91018 Allergy to other foods: Secondary | ICD-10-CM | POA: Diagnosis not present

## 2016-02-25 DIAGNOSIS — F331 Major depressive disorder, recurrent, moderate: Secondary | ICD-10-CM | POA: Diagnosis not present

## 2016-02-26 DIAGNOSIS — L720 Epidermal cyst: Secondary | ICD-10-CM | POA: Diagnosis not present

## 2016-02-26 DIAGNOSIS — R21 Rash and other nonspecific skin eruption: Secondary | ICD-10-CM | POA: Diagnosis not present

## 2016-02-26 DIAGNOSIS — L918 Other hypertrophic disorders of the skin: Secondary | ICD-10-CM | POA: Diagnosis not present

## 2016-02-26 DIAGNOSIS — L821 Other seborrheic keratosis: Secondary | ICD-10-CM | POA: Diagnosis not present

## 2016-02-26 DIAGNOSIS — H5213 Myopia, bilateral: Secondary | ICD-10-CM | POA: Diagnosis not present

## 2016-02-26 DIAGNOSIS — I781 Nevus, non-neoplastic: Secondary | ICD-10-CM | POA: Diagnosis not present

## 2016-02-26 DIAGNOSIS — L817 Pigmented purpuric dermatosis: Secondary | ICD-10-CM | POA: Diagnosis not present

## 2016-02-26 DIAGNOSIS — L858 Other specified epidermal thickening: Secondary | ICD-10-CM | POA: Diagnosis not present

## 2016-03-03 ENCOUNTER — Ambulatory Visit: Payer: BLUE CROSS/BLUE SHIELD | Attending: Specialist

## 2016-03-03 DIAGNOSIS — H8112 Benign paroxysmal vertigo, left ear: Secondary | ICD-10-CM | POA: Diagnosis not present

## 2016-03-03 DIAGNOSIS — R2689 Other abnormalities of gait and mobility: Secondary | ICD-10-CM | POA: Insufficient documentation

## 2016-03-03 DIAGNOSIS — R42 Dizziness and giddiness: Secondary | ICD-10-CM | POA: Diagnosis not present

## 2016-03-03 DIAGNOSIS — H8111 Benign paroxysmal vertigo, right ear: Secondary | ICD-10-CM | POA: Diagnosis not present

## 2016-03-03 NOTE — Patient Instructions (Signed)
Sit to Side-Lying    Sit on edge of bed. 1. Turn head 45 to right. 2. Maintain head position and lie down slowly on left side. Hold until symptoms subside plus 30 seconds.. 3. Sit up slowly. Hold until symptoms subside. 4. Turn head 45 to left. 5. Maintain head position and lie down slowly on right side. Hold until symptoms subside plus 30 seconds. 6. Sit up slowly. Repeat sequence __3__ times per session. Do __1-2__ sessions per day. STOP THIS EXERCISE. Copyright  VHI. All rights reserved.    Side to Side Head Motion    Perform without assistive device. Walking on solid surface, turn head and eyes to left for __2__ steps. Then, turn head and eyes to opposite side for __2__ steps. Repeat sequence _4___ times per session. Do _1-2___ sessions per week.   Copyright  VHI. All rights reserved.  Up / Down Head Motion    Perform without assistive device. Walking on solid surface, move head and eyes toward ceiling for __2__ steps. Then, move head and eyes toward floor for __2__ steps. Repeat __4__ times per session. Do __1-2__ sessions per week.   Copyright  VHI. All rights reserved.    Feet Together (Compliant Surface) Head Motion - Eyes Closed    Stand on compliant surface: __pillow/cushion______ with feet together. Close eyes and move head slowly, up and down 5 times and side to side 5 times. Repeat __3__ times per session. Do __1-2__ sessions per week.  Copyright  VHI. All rights reserved.  Feet Together (Compliant Surface) Varied Arm Positions - Eyes Closed    Stand on compliant surface: ___pillow/cushion_____ with feet together and arms at your side. Close eyes and visualize upright position. Hold__30__ seconds. Repeat _3___ times per session. Do __1-2__ sessions per week.  Copyright  VHI. All rights reserved.

## 2016-03-03 NOTE — Therapy (Signed)
[facility] [address] [place], [place], [address] Phone: [phone]   Fax:  [phone]  Physical Therapy Treatment  Patient Details  Name: [name] MRN: [id number] Date of Birth: Jun 06, 1970 Referring Provider: Santiago Glad, MD  Encounter Date: 03/03/2016      PT End of Session - 03/03/16 0826    Visit Number 4   Number of Visits 6   Date for PT Re-Evaluation 03/03/16   Authorization Type BCBS   PT Start Time 0807  pt arrived late   PT Stop Time 0821   PT Time Calculation (min) 14 min   Activity Tolerance Patient tolerated treatment well   Behavior During Therapy [facility] for tasks assessed/performed      Past Medical History:  Diagnosis Date  . Allergy   . Cancer (HCC)   . Cervical cancer (HCC) 2015    Past Surgical History:  Procedure Laterality Date  . ABDOMINAL HYSTERECTOMY    . BREAST SURGERY    . HERNIA REPAIR      There were no vitals filed for this visit.      Subjective Assessment - 03/03/16 0808    Subjective Pt reported she feels like she is "healed" and feels much better. Pt denied dizziness, she is able to lie down in bed and at doctor appt's.    Pertinent History Cervical cancer 2015 s/p radical hysterectomy; multiple food allergies, chronic migraines   Patient Stated Goals resolve dizziness; wants to exercise to lose weight, play with 65 y/o daughter   Currently in Pain? No/denies            Neuro re-ed: Pt reviewed HEP and PT reduced frequency due to pt's progress. Please see pt instructions for details.                     PT Education - 03/03/16 0824    Education provided Yes   Education Details PT reviewed and educated pt on current HEP, and reduced frequency due to excellent progress. PT encouraged pt to stay active and to start with using phyisoball in supine and progress to seated/standing therex, making sure to use sturdy equipment for safety.  PT encouraged pt to trial yoga again. PT educated pt she would require a new referral if dizziness returns.   Person(s) Educated Patient   Methods Explanation;Handout   Comprehension Returned demonstration;Verbalized understanding          PT Short Term Goals - 01/16/16 1047      PT SHORT TERM GOAL #1   Title STG's = LTG's           PT Long Term Goals - 03/03/16 0850      PT LONG TERM GOAL #1   Title Positional vertigo testing will be negative to indicate resolved BPPV.  (Target date: 02/12/16)   Status Achieved     PT LONG TERM GOAL #2   Title Pt will demonstrate independence with vestibular HEP to maximize functional gains made in PT.  (02/12/16)   Status Achieved     PT LONG TERM GOAL #3   Title Pt will decrease DHI score from 46 to < / = 28 to indicate significant decrease in pt-perceived disability due to dizziness. (02/12/16)   Status Achieved     PT LONG TERM GOAL #4   Title Pt will verbalize understanding of management of BPPV due to history of episodic vertigo.  (02/12/16)   Status Achieved     PT  LONG TERM GOAL #5   Title Assess balance and dynaimc gait stability, if indicated, after vertigo clears.  (02/12/16)   Status Achieved               Plan - 03/03/16 0849    Clinical Impression Statement Pt met all LTGs today, therefore, pt is discharged from PT. Please see d/c summary for details.    Rehab Potential Good   PT Frequency Other (comment)  2x/week for 1 week, then 1x/week for 3 weeks   PT Duration --  See above.   PT Treatment/Interventions ADLs/Self Care Home Management;Vestibular;Canalith Repostioning;Patient/family education;Neuromuscular re-education;Balance training;Therapeutic exercise;Therapeutic activities;Functional mobility training   Consulted and Agree with Plan of Care Patient      Patient will benefit from skilled therapeutic intervention in order to improve the following deficits and impairments:  Dizziness  Visit  Diagnosis: BPPV (benign paroxysmal positional vertigo), right - Plan: PT plan of care cert/re-cert  BPPV (benign paroxysmal positional vertigo), left - Plan: PT plan of care cert/re-cert  Dizziness and giddiness - Plan: PT plan of care cert/re-cert  Other abnormalities of gait and mobility - Plan: PT plan of care cert/re-cert     Problem List Patient Active Problem List   Diagnosis Date Noted  . OSA (obstructive sleep apnea) 03/13/2015  . Morbid obesity (HCC) 03/13/2015    Gazelle Towe L 03/03/2016, 8:53 AM  [facility] [address] [place], [place], [address] Phone: [phone]   Fax:  [phone]  Name: [name] MRN: [id number] Date of Birth: 18-Sep-1970  PHYSICAL THERAPY DISCHARGE SUMMARY  Visits from Start of Care: 4  Current functional level related to goals / functional outcomes:     PT Long Term Goals - 03/03/16 0850      PT LONG TERM GOAL #1   Title Positional vertigo testing will be negative to indicate resolved BPPV.  (Target date: 02/12/16)   Status Achieved     PT LONG TERM GOAL #2   Title Pt will demonstrate independence with vestibular HEP to maximize functional gains made in PT.  (02/12/16)   Status Achieved     PT LONG TERM GOAL #3   Title Pt will decrease DHI score from 46 to < / = 28 to indicate significant decrease in pt-perceived disability due to dizziness. (02/12/16)   Status Achieved     PT LONG TERM GOAL #4   Title Pt will verbalize understanding of management of BPPV due to history of episodic vertigo.  (02/12/16)   Status Achieved     PT LONG TERM GOAL #5   Title Assess balance and dynaimc gait stability, if indicated, after vertigo clears.  (02/12/16)   Status Achieved        Remaining deficits: None   Education / Equipment: HEP  Plan: Patient agrees to discharge.  Patient goals were met. Patient is being discharged due to meeting the stated rehab goals.   ?????          Zerita Boers, PT,DPT 03/03/16 8:55 AM Phone: [phone] Fax: [phone]

## 2016-03-09 DIAGNOSIS — K580 Irritable bowel syndrome with diarrhea: Secondary | ICD-10-CM | POA: Diagnosis not present

## 2016-03-09 DIAGNOSIS — K9049 Malabsorption due to intolerance, not elsewhere classified: Secondary | ICD-10-CM | POA: Diagnosis not present

## 2016-03-16 DIAGNOSIS — F5089 Other specified eating disorder: Secondary | ICD-10-CM | POA: Diagnosis not present

## 2016-03-16 DIAGNOSIS — F411 Generalized anxiety disorder: Secondary | ICD-10-CM | POA: Diagnosis not present

## 2016-03-17 DIAGNOSIS — G4733 Obstructive sleep apnea (adult) (pediatric): Secondary | ICD-10-CM | POA: Diagnosis not present

## 2016-03-17 DIAGNOSIS — G4710 Hypersomnia, unspecified: Secondary | ICD-10-CM | POA: Diagnosis not present

## 2016-03-24 DIAGNOSIS — F331 Major depressive disorder, recurrent, moderate: Secondary | ICD-10-CM | POA: Diagnosis not present

## 2016-03-29 DIAGNOSIS — Z79890 Hormone replacement therapy (postmenopausal): Secondary | ICD-10-CM | POA: Diagnosis not present

## 2016-03-29 DIAGNOSIS — E8940 Asymptomatic postprocedural ovarian failure: Secondary | ICD-10-CM | POA: Diagnosis not present

## 2016-03-29 DIAGNOSIS — E8941 Symptomatic postprocedural ovarian failure: Secondary | ICD-10-CM | POA: Diagnosis not present

## 2016-03-31 ENCOUNTER — Encounter: Payer: Self-pay | Admitting: Internal Medicine

## 2016-04-05 DIAGNOSIS — E8940 Asymptomatic postprocedural ovarian failure: Secondary | ICD-10-CM | POA: Diagnosis not present

## 2016-04-13 DIAGNOSIS — R1030 Lower abdominal pain, unspecified: Secondary | ICD-10-CM | POA: Diagnosis not present

## 2016-04-13 DIAGNOSIS — R197 Diarrhea, unspecified: Secondary | ICD-10-CM | POA: Diagnosis not present

## 2016-04-13 DIAGNOSIS — R140 Abdominal distension (gaseous): Secondary | ICD-10-CM | POA: Diagnosis not present

## 2016-04-14 DIAGNOSIS — F411 Generalized anxiety disorder: Secondary | ICD-10-CM | POA: Diagnosis not present

## 2016-04-18 DIAGNOSIS — G4710 Hypersomnia, unspecified: Secondary | ICD-10-CM | POA: Diagnosis not present

## 2016-04-18 DIAGNOSIS — G4733 Obstructive sleep apnea (adult) (pediatric): Secondary | ICD-10-CM | POA: Diagnosis not present

## 2016-04-21 DIAGNOSIS — F5089 Other specified eating disorder: Secondary | ICD-10-CM | POA: Diagnosis not present

## 2016-04-22 DIAGNOSIS — E8941 Symptomatic postprocedural ovarian failure: Secondary | ICD-10-CM | POA: Diagnosis not present

## 2016-04-22 DIAGNOSIS — Z1231 Encounter for screening mammogram for malignant neoplasm of breast: Secondary | ICD-10-CM | POA: Diagnosis not present

## 2016-04-22 DIAGNOSIS — Z981 Arthrodesis status: Secondary | ICD-10-CM | POA: Diagnosis not present

## 2016-04-26 DIAGNOSIS — M542 Cervicalgia: Secondary | ICD-10-CM | POA: Diagnosis not present

## 2016-04-26 DIAGNOSIS — G518 Other disorders of facial nerve: Secondary | ICD-10-CM | POA: Diagnosis not present

## 2016-04-26 DIAGNOSIS — M791 Myalgia: Secondary | ICD-10-CM | POA: Diagnosis not present

## 2016-04-26 DIAGNOSIS — G43719 Chronic migraine without aura, intractable, without status migrainosus: Secondary | ICD-10-CM | POA: Diagnosis not present

## 2016-04-27 DIAGNOSIS — Z713 Dietary counseling and surveillance: Secondary | ICD-10-CM | POA: Diagnosis not present

## 2016-04-28 DIAGNOSIS — F5089 Other specified eating disorder: Secondary | ICD-10-CM | POA: Diagnosis not present

## 2016-05-12 DIAGNOSIS — Z23 Encounter for immunization: Secondary | ICD-10-CM | POA: Diagnosis not present

## 2016-05-12 DIAGNOSIS — E8940 Asymptomatic postprocedural ovarian failure: Secondary | ICD-10-CM | POA: Diagnosis not present

## 2016-05-18 DIAGNOSIS — Z713 Dietary counseling and surveillance: Secondary | ICD-10-CM | POA: Diagnosis not present

## 2016-05-19 DIAGNOSIS — F5089 Other specified eating disorder: Secondary | ICD-10-CM | POA: Diagnosis not present

## 2016-05-26 DIAGNOSIS — F509 Eating disorder, unspecified: Secondary | ICD-10-CM | POA: Diagnosis not present

## 2016-06-07 DIAGNOSIS — F509 Eating disorder, unspecified: Secondary | ICD-10-CM | POA: Diagnosis not present

## 2016-06-08 DIAGNOSIS — Z713 Dietary counseling and surveillance: Secondary | ICD-10-CM | POA: Diagnosis not present

## 2016-06-09 DIAGNOSIS — R319 Hematuria, unspecified: Secondary | ICD-10-CM | POA: Diagnosis not present

## 2016-06-09 DIAGNOSIS — N390 Urinary tract infection, site not specified: Secondary | ICD-10-CM | POA: Diagnosis not present

## 2016-06-16 DIAGNOSIS — F509 Eating disorder, unspecified: Secondary | ICD-10-CM | POA: Diagnosis not present

## 2016-06-17 DIAGNOSIS — G4710 Hypersomnia, unspecified: Secondary | ICD-10-CM | POA: Diagnosis not present

## 2016-06-17 DIAGNOSIS — G4733 Obstructive sleep apnea (adult) (pediatric): Secondary | ICD-10-CM | POA: Diagnosis not present

## 2016-06-30 DIAGNOSIS — F509 Eating disorder, unspecified: Secondary | ICD-10-CM | POA: Diagnosis not present

## 2016-07-07 DIAGNOSIS — F331 Major depressive disorder, recurrent, moderate: Secondary | ICD-10-CM | POA: Diagnosis not present

## 2016-07-07 DIAGNOSIS — F5089 Other specified eating disorder: Secondary | ICD-10-CM | POA: Diagnosis not present

## 2016-07-14 DIAGNOSIS — F411 Generalized anxiety disorder: Secondary | ICD-10-CM | POA: Diagnosis not present

## 2016-07-14 DIAGNOSIS — F331 Major depressive disorder, recurrent, moderate: Secondary | ICD-10-CM | POA: Diagnosis not present

## 2016-07-18 DIAGNOSIS — G4733 Obstructive sleep apnea (adult) (pediatric): Secondary | ICD-10-CM | POA: Diagnosis not present

## 2016-07-18 DIAGNOSIS — G4710 Hypersomnia, unspecified: Secondary | ICD-10-CM | POA: Diagnosis not present

## 2016-07-21 DIAGNOSIS — F331 Major depressive disorder, recurrent, moderate: Secondary | ICD-10-CM | POA: Diagnosis not present

## 2016-07-25 DIAGNOSIS — M542 Cervicalgia: Secondary | ICD-10-CM | POA: Diagnosis not present

## 2016-07-25 DIAGNOSIS — M791 Myalgia: Secondary | ICD-10-CM | POA: Diagnosis not present

## 2016-07-25 DIAGNOSIS — G43719 Chronic migraine without aura, intractable, without status migrainosus: Secondary | ICD-10-CM | POA: Diagnosis not present

## 2016-07-25 DIAGNOSIS — G518 Other disorders of facial nerve: Secondary | ICD-10-CM | POA: Diagnosis not present

## 2016-07-29 DIAGNOSIS — F509 Eating disorder, unspecified: Secondary | ICD-10-CM | POA: Diagnosis not present

## 2016-08-09 DIAGNOSIS — G43719 Chronic migraine without aura, intractable, without status migrainosus: Secondary | ICD-10-CM | POA: Diagnosis not present

## 2016-08-09 DIAGNOSIS — M542 Cervicalgia: Secondary | ICD-10-CM | POA: Diagnosis not present

## 2016-08-09 DIAGNOSIS — M791 Myalgia: Secondary | ICD-10-CM | POA: Diagnosis not present

## 2016-08-09 DIAGNOSIS — G518 Other disorders of facial nerve: Secondary | ICD-10-CM | POA: Diagnosis not present

## 2016-08-22 DIAGNOSIS — F331 Major depressive disorder, recurrent, moderate: Secondary | ICD-10-CM | POA: Diagnosis not present

## 2016-08-24 DIAGNOSIS — M542 Cervicalgia: Secondary | ICD-10-CM | POA: Diagnosis not present

## 2016-08-24 DIAGNOSIS — G43719 Chronic migraine without aura, intractable, without status migrainosus: Secondary | ICD-10-CM | POA: Diagnosis not present

## 2016-08-24 DIAGNOSIS — G518 Other disorders of facial nerve: Secondary | ICD-10-CM | POA: Diagnosis not present

## 2016-08-24 DIAGNOSIS — M791 Myalgia: Secondary | ICD-10-CM | POA: Diagnosis not present

## 2016-08-27 ENCOUNTER — Ambulatory Visit (INDEPENDENT_AMBULATORY_CARE_PROVIDER_SITE_OTHER): Payer: BLUE CROSS/BLUE SHIELD | Admitting: Family Medicine

## 2016-08-27 VITALS — BP 118/78 | HR 82 | Temp 98.8°F | Resp 16 | Ht 64.5 in | Wt 220.0 lb

## 2016-08-27 DIAGNOSIS — B372 Candidiasis of skin and nail: Secondary | ICD-10-CM

## 2016-08-27 LAB — GLUCOSE, POCT (MANUAL RESULT ENTRY): POC GLUCOSE: 72 mg/dL (ref 70–99)

## 2016-08-27 LAB — POCT SKIN KOH: Skin KOH, POC: POSITIVE — AB

## 2016-08-27 MED ORDER — TERBINAFINE HCL 250 MG PO TABS: 250.0000 mg | ORAL_TABLET | Freq: Every day | ORAL | 0 refills | Status: DC

## 2016-08-27 NOTE — Patient Instructions (Addendum)
Use over-the-counter Lotrimin (clotrimazole) cream twice daily on the areas of rash. If not improving over the next 3 or 4 days, go ahead and take the oral terbinafine 1 daily 250 mg.  Return if further problems  Your blood sugar is good at 72    IF you received an x-ray today, you will receive an invoice from [facility]. Please contact [facility] at [phone] with questions or concerns regarding your invoice.   IF you received labwork today, you will receive an invoice from [facility]. Please contact [facility] at [phone] with questions or concerns regarding your invoice.   Our billing staff will not be able to assist you with questions regarding bills from these companies.  You will be contacted with the lab results as soon as they are available. The fastest way to get your results is to activate your My Chart account. Instructions are located on the last page of this paperwork. If you have not heard from [place] regarding the results in 2 weeks, please contact this office.

## 2016-08-27 NOTE — Progress Notes (Signed)
Patient ID: [name], female    DOB: 1971-01-30  Age: 46 y.o. MRN: [id number]  Chief Complaint  Patient presents with  . Rash    Subjective:   Patient has developed a rash under right axilla over the last 2 days, now getting a little under the left. She has been trying cornstarch over the last week actually and then some cortisone cream which burned and then cornstarch again. Is gotten worse rather than better. She denies any under her breasts or in her groin region. He is not diabetic that she knows of. She has lost about 10 pounds recently and is very happy with how her blood pressures come down.  Current allergies, medications, problem list, past/family and social histories reviewed.  Objective:  BP 118/78 (BP Location: Right Arm, Patient Position: Sitting, Cuff Size: Large)   Pulse 82   Temp 98.8 F (37.1 C) (Oral)   Resp 16   Ht 5' 4.5" (1.638 m)   Wt 220 lb (99.8 kg)   SpO2 97%   BMI 37.18 kg/m   No major distress stress. Very red axillary rash on the right, small amount on the left. Some satellite lesions.  Glucose 72  Assessment & Plan:   Assessment: 1. Monilial rash       Plan: Skin scraping was performed using a scalpel to scrape some of the surface cells. This was examined by me under the microscope and yeast is noted to be present. Decided to check her sugar to make sure she was not diabetic since she is an overweight middle-aged lady, but this was normal. Will treat topically but if it doesn't respond when she can get the prescription filled and treat orally.  Orders Placed This Encounter  Procedures  . POCT Skin KOH  . POCT glucose (manual entry)    No orders of the defined types were placed in this encounter.        Patient Instructions   Use over-the-counter Lotrimin (clotrimazole) cream twice daily on the areas of rash. If not improving over the next 3 or 4 days, go ahead and take the oral terbinafine 1 daily 250 mg.  Return if further  problems  Your blood sugar is good at 72    IF you received an x-ray today, you will receive an invoice from [facility]. Please contact [facility] at [phone] with questions or concerns regarding your invoice.   IF you received labwork today, you will receive an invoice from [facility]. Please contact [facility] at [phone] with questions or concerns regarding your invoice.   Our billing staff will not be able to assist you with questions regarding bills from these companies.  You will be contacted with the lab results as soon as they are available. The fastest way to get your results is to activate your My Chart account. Instructions are located on the last page of this paperwork. If you have not heard from [place] regarding the results in 2 weeks, please contact this office.         No Follow-up on file.   HOPPER,DAVID, MD 08/27/2016

## 2016-09-01 DIAGNOSIS — F331 Major depressive disorder, recurrent, moderate: Secondary | ICD-10-CM | POA: Diagnosis not present

## 2016-09-06 DIAGNOSIS — F509 Eating disorder, unspecified: Secondary | ICD-10-CM | POA: Diagnosis not present

## 2016-09-23 DIAGNOSIS — G43719 Chronic migraine without aura, intractable, without status migrainosus: Secondary | ICD-10-CM | POA: Diagnosis not present

## 2016-10-12 DIAGNOSIS — M542 Cervicalgia: Secondary | ICD-10-CM | POA: Diagnosis not present

## 2016-10-12 DIAGNOSIS — M791 Myalgia: Secondary | ICD-10-CM | POA: Diagnosis not present

## 2016-10-12 DIAGNOSIS — G43719 Chronic migraine without aura, intractable, without status migrainosus: Secondary | ICD-10-CM | POA: Diagnosis not present

## 2016-10-12 DIAGNOSIS — G518 Other disorders of facial nerve: Secondary | ICD-10-CM | POA: Diagnosis not present

## 2016-10-24 DIAGNOSIS — Z8541 Personal history of malignant neoplasm of cervix uteri: Secondary | ICD-10-CM | POA: Diagnosis not present

## 2016-10-24 DIAGNOSIS — Z923 Personal history of irradiation: Secondary | ICD-10-CM | POA: Diagnosis not present

## 2016-10-24 DIAGNOSIS — Z08 Encounter for follow-up examination after completed treatment for malignant neoplasm: Secondary | ICD-10-CM | POA: Diagnosis not present

## 2016-10-24 DIAGNOSIS — R911 Solitary pulmonary nodule: Secondary | ICD-10-CM | POA: Diagnosis not present

## 2016-10-24 DIAGNOSIS — Z90722 Acquired absence of ovaries, bilateral: Secondary | ICD-10-CM | POA: Diagnosis not present

## 2016-10-24 DIAGNOSIS — Z90710 Acquired absence of both cervix and uterus: Secondary | ICD-10-CM | POA: Diagnosis not present

## 2016-10-24 DIAGNOSIS — C539 Malignant neoplasm of cervix uteri, unspecified: Secondary | ICD-10-CM | POA: Diagnosis not present

## 2016-11-01 DIAGNOSIS — F509 Eating disorder, unspecified: Secondary | ICD-10-CM | POA: Diagnosis not present

## 2016-11-02 DIAGNOSIS — F5001 Anorexia nervosa, restricting type: Secondary | ICD-10-CM | POA: Diagnosis not present

## 2016-11-15 DIAGNOSIS — F5001 Anorexia nervosa, restricting type: Secondary | ICD-10-CM | POA: Diagnosis not present

## 2016-11-17 DIAGNOSIS — F331 Major depressive disorder, recurrent, moderate: Secondary | ICD-10-CM | POA: Diagnosis not present

## 2016-11-21 DIAGNOSIS — H60333 Swimmer's ear, bilateral: Secondary | ICD-10-CM | POA: Diagnosis not present

## 2016-11-21 DIAGNOSIS — B370 Candidal stomatitis: Secondary | ICD-10-CM | POA: Diagnosis not present

## 2016-11-30 DIAGNOSIS — G43719 Chronic migraine without aura, intractable, without status migrainosus: Secondary | ICD-10-CM | POA: Diagnosis not present

## 2016-11-30 DIAGNOSIS — M791 Myalgia: Secondary | ICD-10-CM | POA: Diagnosis not present

## 2016-11-30 DIAGNOSIS — G518 Other disorders of facial nerve: Secondary | ICD-10-CM | POA: Diagnosis not present

## 2016-11-30 DIAGNOSIS — F5001 Anorexia nervosa, restricting type: Secondary | ICD-10-CM | POA: Diagnosis not present

## 2016-11-30 DIAGNOSIS — M542 Cervicalgia: Secondary | ICD-10-CM | POA: Diagnosis not present

## 2016-12-01 DIAGNOSIS — F509 Eating disorder, unspecified: Secondary | ICD-10-CM | POA: Diagnosis not present

## 2016-12-22 DIAGNOSIS — F509 Eating disorder, unspecified: Secondary | ICD-10-CM | POA: Diagnosis not present

## 2017-01-02 DIAGNOSIS — G4733 Obstructive sleep apnea (adult) (pediatric): Secondary | ICD-10-CM | POA: Diagnosis not present

## 2017-01-02 DIAGNOSIS — G4710 Hypersomnia, unspecified: Secondary | ICD-10-CM | POA: Diagnosis not present

## 2017-01-04 DIAGNOSIS — G43719 Chronic migraine without aura, intractable, without status migrainosus: Secondary | ICD-10-CM | POA: Diagnosis not present

## 2017-01-05 DIAGNOSIS — F5081 Binge eating disorder: Secondary | ICD-10-CM | POA: Diagnosis not present

## 2017-01-11 DIAGNOSIS — G43719 Chronic migraine without aura, intractable, without status migrainosus: Secondary | ICD-10-CM | POA: Diagnosis not present

## 2017-01-11 DIAGNOSIS — M542 Cervicalgia: Secondary | ICD-10-CM | POA: Diagnosis not present

## 2017-01-11 DIAGNOSIS — G518 Other disorders of facial nerve: Secondary | ICD-10-CM | POA: Diagnosis not present

## 2017-01-11 DIAGNOSIS — M791 Myalgia: Secondary | ICD-10-CM | POA: Diagnosis not present

## 2017-01-11 DIAGNOSIS — F5081 Binge eating disorder: Secondary | ICD-10-CM | POA: Diagnosis not present

## 2017-01-20 DIAGNOSIS — R197 Diarrhea, unspecified: Secondary | ICD-10-CM | POA: Diagnosis not present

## 2017-01-22 DIAGNOSIS — L03011 Cellulitis of right finger: Secondary | ICD-10-CM | POA: Diagnosis not present

## 2017-01-25 DIAGNOSIS — R42 Dizziness and giddiness: Secondary | ICD-10-CM | POA: Diagnosis not present

## 2017-01-25 DIAGNOSIS — H9313 Tinnitus, bilateral: Secondary | ICD-10-CM | POA: Diagnosis not present

## 2017-02-06 DIAGNOSIS — F5081 Binge eating disorder: Secondary | ICD-10-CM | POA: Diagnosis not present

## 2017-02-16 DIAGNOSIS — F5081 Binge eating disorder: Secondary | ICD-10-CM | POA: Diagnosis not present

## 2017-02-25 DIAGNOSIS — L299 Pruritus, unspecified: Secondary | ICD-10-CM | POA: Diagnosis not present

## 2017-02-25 DIAGNOSIS — B029 Zoster without complications: Secondary | ICD-10-CM | POA: Diagnosis not present

## 2017-02-27 ENCOUNTER — Ambulatory Visit (INDEPENDENT_AMBULATORY_CARE_PROVIDER_SITE_OTHER): Payer: BLUE CROSS/BLUE SHIELD | Admitting: Internal Medicine

## 2017-02-27 ENCOUNTER — Encounter: Payer: Self-pay | Admitting: Internal Medicine

## 2017-02-27 VITALS — BP 128/86 | HR 86 | Temp 98.2°F | Ht 64.75 in | Wt 221.4 lb

## 2017-02-27 DIAGNOSIS — E8940 Asymptomatic postprocedural ovarian failure: Secondary | ICD-10-CM | POA: Insufficient documentation

## 2017-02-27 DIAGNOSIS — Z6841 Body Mass Index (BMI) 40.0 and over, adult: Secondary | ICD-10-CM | POA: Insufficient documentation

## 2017-02-27 DIAGNOSIS — R5383 Other fatigue: Secondary | ICD-10-CM | POA: Diagnosis not present

## 2017-02-27 DIAGNOSIS — E669 Obesity, unspecified: Secondary | ICD-10-CM | POA: Diagnosis not present

## 2017-02-27 HISTORY — DX: Obesity, unspecified: E66.9

## 2017-02-27 LAB — VITAMIN D 25 HYDROXY (VIT D DEFICIENCY, FRACTURES): VITD: 29.53 ng/mL — ABNORMAL LOW (ref 30.00–100.00)

## 2017-02-27 LAB — T3, FREE: T3 FREE: 3.1 pg/mL (ref 2.3–4.2)

## 2017-02-27 LAB — T4, FREE: Free T4: 0.82 ng/dL (ref 0.60–1.60)

## 2017-02-27 LAB — VITAMIN B12: Vitamin B-12: 402 pg/mL (ref 211–911)

## 2017-02-27 LAB — TSH: TSH: 0.91 u[IU]/mL (ref 0.35–4.50)

## 2017-02-27 MED ORDER — VIVELLE-DOT 0.1 MG/24HR TD PTTW: 1.0000 | MEDICATED_PATCH | TRANSDERMAL | 12 refills | Status: DC

## 2017-02-27 NOTE — Progress Notes (Signed)
Patient ID: [name], female   DOB: December 13, 1970, 46 y.o.   MRN: [id number]   HPI: [name] is a 46 y.o. female, referred by Dr Kingsley Callander, for management of surgical premature ovarian failure.  Pt has a h/o cervical cancer, TAH/BSO in 2015. She developed postsurgical premature menopause at 46 years old  and she was started on hormone replacement therapy. She was previously seen by Dr. Barry Dienes at [facility], however, she would like to see somebody in [place] especially now that Dr. Kingsley Callander is moving. I reviewed Dr Lenox Ponds notes in [place].  She was initially treated with Climara patches >>  she tells me that she could not use the patches as awould not stay put on her skin >> now on Divigel (Estradiol gel) 1 mg/day. She is not happy with this product due to the fact  that she needs to apply it on a larger area of skin, on her lower back/buttocks area, but she does sweat and she feels that she may transfer her estrogen to her daughter, who sleeps with her. She would be interested to change this if possible.  However, she feels that she does not have significant hot flashes on this.  She also have anxiety/depression and is under a lot of stress being a single mom and also caring for her elderly mother. She would be interested in starting a medication for depression/anxiety. She is seeing a Social worker and was recommended to see a psychiatrist. She tells me she does not have a PCP.    No thyroid ds in her family, but she would be interested in having her thyroid function checked.  Other problems: Obesity, chronic migraine (sees neurology), IBS, colitis (sees Dr. Penelope Coop - [facility]).  ROS: Constitutional: + weight gain/, + fatigue, + subjective hyperthermia, + nocturia 2, + poor sleep Eyes: no blurry vision, no xerophthalmia ENT: no sore throat, no nodules palpated in throat, no dysphagia/odynophagia, no hoarseness Cardiovascular: no CP/+ SOB/no palpitations/leg swelling Respiratory: no  cough/+ SOB Gastrointestinal: no N/V/+ D/no C Musculoskeletal: no muscle/joint aches Skin: no acne, no hair on face, no dark discoloration of skin Neurological: no tremors/numbness/tingling/dizziness, + headache Psychiatric: + Both: depression/anxiety  Past Medical History:  Diagnosis Date  . Allergy   . Cancer ([place])   . Cervical cancer ([place]) 2015   Past Surgical History:  Procedure Laterality Date  . ABDOMINAL HYSTERECTOMY    . BREAST SURGERY    . HERNIA REPAIR     Social History   Social History  . Marital status: Single    Spouse name: N/A  . Number of children: 1   Occupational History  . 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 Smoking status: Former Smoker    Quit date: 04/26/1998  . Smokeless tobacco: Never Used  . Alcohol use No       . Drug use: No   Current Outpatient Prescriptions on File Prior to Visit  Medication Sig Dispense Refill  . cholecalciferol (VITAMIN D) 1000 UNITS tablet Take 1,000 Units by mouth daily.    Marland Kitchen DIVIGEL 1 MG/GM GEL PLACE 1 MG ONTO THE SKIN D  3  . Multiple Vitamin (MULTIVITAMIN WITH MINERALS) TABS tablet Take 1 tablet by mouth daily.    Marland Kitchen terbinafine (LAMISIL) 250 MG tablet Take 1 tablet (250 mg total) by mouth daily. 14 tablet 0   No current facility-administered medications on file prior to visit.    Allergies  Allergen Reactions  .  Amoxicillin     Rash   . Eggs Or Egg-Derived Products Diarrhea  . Other Diarrhea and Other (See Comments)    Nuts, dairy, lettuce, peanuts, gluten, corn and wheat  . Penicillins     Rash   . Sulfa Antibiotics     Rash    Family History  Problem Relation Age of Onset  . Cancer Father   Also, diabetes in father, maternal grandfather Hypertension in mother Heart disease in maternal grandfather  PE: BP 128/86   Pulse 17   Temp 98.2 F (36.8 C) (Oral)   Ht 5' 4.75" (1.645 m)   Wt 221 lb 6 oz (100.4 kg)   SpO2 95%   BMI 37.12 kg/m  Wt Readings from Last  3 Encounters:  02/27/17 221 lb 6 oz (100.4 kg)  08/27/16 220 lb (99.8 kg)  01/13/16 215 lb 12.8 oz (97.9 kg)   Constitutional: obese, in NAD Eyes: PERRLA, EOMI, no exophthalmos ENT: moist mucous membranes, no thyromegaly, no cervical lymphadenopathy Cardiovascular: RRR, No MRG Respiratory: CTA B Gastrointestinal: abdomen soft, NT, ND, BS+ Musculoskeletal: no deformities, strength intact in all 4 Skin: moist, warm; no rashes Neurological: no tremor with outstretched hands, DTR normal in all 4  ASSESSMENT: 1. Premature surgical menopause  2. Fatigue  3. Obesity class 2 BMI Classification:  < 18.5 underweight   18.5-24.9 normal weight   25.0-29.9 overweight   30.0-34.9 class I obesity   35.0-39.9 class II obesity   ? 40.0 class III obesity   PLAN: 1. Patient with history of premature menopause after her TAH + BSO for cervical cancer. She was initially started on Climara, but she was having problems with the patch as she has to change it every week and she is sweating a lot and not able to keep it in place. She was then switched to estradiol gel (Divigel), which she applies to the buttocks area/lower back, however, she sleeps in the same bed with her daughter and is constantly afraid that she may transfer he to her. Also, she needs to wait a long time for the larger area to dry so she is not completely happy with this product. Of note, this is also quite expensive for her.  - At this visit, we discussed that she absolutely need to be on estrogen replacement to avoid hot flashes, vaginal dryness, but especially bone mineral density decreased. I did review her most recent DEXA scan review from last year and all the scores were normal. - I suggested to switch to Vivelle, which is estradiol patch which she needs to change twice a week, rather than once a week, and is known to stay in place better than its generic equivalent, Climara. She would like to try this and I sent it to her  pharmacy. We will use 1 mg daily. However, I advised her to let me know if she cannot obtain it, in that case,  we discussed to refilled Divigel but to apply it on the inside of her thigh, more protected from contact and from sweat than the lower back.  2. Fatigue - Patient complains of fatigue and is under a lot of stress as a single mom and taking care of her mother. She would like me to check her thyroid test and I will add a B12 and a vitamin D level.  - regarding her anxiety/depression, these may definitely contribute to her fatigue. However, I recommended that she establishes care with a PCP and also continue to work with  her counselor   3. Obesity class 2 - She would like to lose weight and she was wondering whether referral to the [facility] is possible. We did discuss about improvement in her diet and I did suggest a whole food plant-based diet. I gave her references and advised her to let me know if she does not want to or cannot do the diet, and in that case, I would be happy to refer her to Dr. Dennard Nip.  RTC in 6 mo.  Component     Latest Ref Rng & Units 02/27/2017  TSH     0.35 - 4.50 uIU/mL 0.91  T4,Free(Direct)     0.60 - 1.60 ng/dL 0.82  Triiodothyronine,Free,Serum     2.3 - 4.2 pg/mL 3.1  Vitamin B12     211 - 911 pg/mL 402  VITD     30.00 - 100.00 ng/mL 29.53 (L)   Normal TFTs and B12. Slightly low vitamin D. We'll start 2000 units vitamin D daily.   Philemon Kingdom, MD PhD [facility]

## 2017-02-27 NOTE — Patient Instructions (Addendum)
  Please look up "The Engine 2 diet" by Christa See. Look up "The 7 day rescue diet".  Please stop at the lab.  Change from Divigel to [facility] and change 2x week.  Please come back for a follow-up appointment in 6 months.

## 2017-02-28 ENCOUNTER — Telehealth: Payer: Self-pay

## 2017-02-28 NOTE — Telephone Encounter (Signed)
-----   Message from Philemon Kingdom, MD sent at 02/27/2017  5:02 PM EDT ----- Almyra Free, can you please call pt: Normal TFTs and B12. Slightly low vitamin D. Let's start 2000 units vitamin D daily.

## 2017-02-28 NOTE — Telephone Encounter (Signed)
LVM, gave lab results. Gave call back number if any questions or concerns.  

## 2017-03-01 ENCOUNTER — Encounter: Payer: Self-pay | Admitting: Internal Medicine

## 2017-03-01 DIAGNOSIS — K900 Celiac disease: Secondary | ICD-10-CM | POA: Diagnosis not present

## 2017-03-02 DIAGNOSIS — F5081 Binge eating disorder: Secondary | ICD-10-CM | POA: Diagnosis not present

## 2017-03-06 DIAGNOSIS — H524 Presbyopia: Secondary | ICD-10-CM | POA: Diagnosis not present

## 2017-03-16 HISTORY — PX: COLONOSCOPY: SHX174

## 2017-03-22 DIAGNOSIS — K5730 Diverticulosis of large intestine without perforation or abscess without bleeding: Secondary | ICD-10-CM | POA: Diagnosis not present

## 2017-03-22 DIAGNOSIS — R197 Diarrhea, unspecified: Secondary | ICD-10-CM | POA: Diagnosis not present

## 2017-03-23 DIAGNOSIS — F5081 Binge eating disorder: Secondary | ICD-10-CM | POA: Diagnosis not present

## 2017-03-28 DIAGNOSIS — R197 Diarrhea, unspecified: Secondary | ICD-10-CM | POA: Diagnosis not present

## 2017-03-28 DIAGNOSIS — F411 Generalized anxiety disorder: Secondary | ICD-10-CM | POA: Diagnosis not present

## 2017-03-28 DIAGNOSIS — F331 Major depressive disorder, recurrent, moderate: Secondary | ICD-10-CM | POA: Diagnosis not present

## 2017-03-28 DIAGNOSIS — F4310 Post-traumatic stress disorder, unspecified: Secondary | ICD-10-CM | POA: Diagnosis not present

## 2017-03-30 DIAGNOSIS — F5081 Binge eating disorder: Secondary | ICD-10-CM | POA: Diagnosis not present

## 2017-04-03 DIAGNOSIS — G4733 Obstructive sleep apnea (adult) (pediatric): Secondary | ICD-10-CM | POA: Diagnosis not present

## 2017-04-03 DIAGNOSIS — G4710 Hypersomnia, unspecified: Secondary | ICD-10-CM | POA: Diagnosis not present

## 2017-04-04 DIAGNOSIS — F330 Major depressive disorder, recurrent, mild: Secondary | ICD-10-CM | POA: Diagnosis not present

## 2017-04-07 DIAGNOSIS — G43719 Chronic migraine without aura, intractable, without status migrainosus: Secondary | ICD-10-CM | POA: Diagnosis not present

## 2017-04-11 DIAGNOSIS — K580 Irritable bowel syndrome with diarrhea: Secondary | ICD-10-CM | POA: Diagnosis not present

## 2017-04-11 DIAGNOSIS — K610 Anal abscess: Secondary | ICD-10-CM | POA: Diagnosis not present

## 2017-04-13 DIAGNOSIS — K610 Anal abscess: Secondary | ICD-10-CM | POA: Diagnosis not present

## 2017-04-15 ENCOUNTER — Other Ambulatory Visit: Payer: Self-pay

## 2017-04-15 ENCOUNTER — Encounter (HOSPITAL_COMMUNITY): Payer: Self-pay

## 2017-04-15 ENCOUNTER — Emergency Department (HOSPITAL_COMMUNITY)
Admission: EM | Admit: 2017-04-15 | Discharge: 2017-04-15 | Disposition: A | Discharge status: Home / Self Care | Payer: BLUE CROSS/BLUE SHIELD | Attending: Emergency Medicine | Admitting: Emergency Medicine

## 2017-04-15 DIAGNOSIS — Z880 Allergy status to penicillin: Secondary | ICD-10-CM | POA: Insufficient documentation

## 2017-04-15 DIAGNOSIS — L0231 Cutaneous abscess of buttock: Secondary | ICD-10-CM | POA: Diagnosis not present

## 2017-04-15 DIAGNOSIS — Z882 Allergy status to sulfonamides status: Secondary | ICD-10-CM | POA: Diagnosis not present

## 2017-04-15 DIAGNOSIS — Z79899 Other long term (current) drug therapy: Secondary | ICD-10-CM | POA: Insufficient documentation

## 2017-04-15 DIAGNOSIS — Z87891 Personal history of nicotine dependence: Secondary | ICD-10-CM | POA: Diagnosis not present

## 2017-04-15 NOTE — ED Notes (Signed)
ED Provider at bedside. 

## 2017-04-15 NOTE — Discharge Instructions (Addendum)
Continue sitz baths. Stop ciprofloxacin and flagyl. Take doxycycline until finished.

## 2017-04-15 NOTE — ED Provider Notes (Signed)
[name] DEPT Provider Note   CSN: [id number] Arrival date & time: 04/15/17  1116     History   Chief Complaint No chief complaint on file.   HPI [name] is a 46 y.o. female.  HPI   49yF with abscess. L buttock. History of the same. Recurrent in this area. One recently I&D'd. Prescribed cipro/flagyl.  Feels like another one has come up. Subjective low grade fever.   Past Medical History:  Diagnosis Date  . Allergy   . Cancer ([place])   . Cervical cancer [facility]) 2015    Patient Active Problem List   Diagnosis Date Noted  . 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 Obesity, Class II, BMI 35-39.9 02/27/2017  . OSA (obstructive sleep apnea) 03/13/2015  . Morbid obesity ([place]) 03/13/2015    Past Surgical History:  Procedure Laterality Date  . ABDOMINAL HYSTERECTOMY    . BREAST SURGERY    . HERNIA REPAIR      OB History    Gravida Para Term Preterm AB Living   0 0 0 0 0     SAB TAB Ectopic Multiple Live Births   0 0 0           Home Medications    Prior to Admission medications   Medication Sig Start Date End Date Taking? Authorizing Provider  cholecalciferol (VITAMIN D) 1000 UNITS tablet Take 1,000 Units by mouth daily.    [provider]  DIVIGEL 1 MG/GM GEL PLACE 1 MG ONTO THE SKIN D 01/15/15   [provider]  Multiple Vitamin (MULTIVITAMIN WITH MINERALS) TABS tablet Take 1 tablet by mouth daily.    [provider]  predniSONE (DELTASONE) 20 MG tablet Take 2 tablets by mouth daily. [id number] 02/25/17   [provider]  terbinafine (LAMISIL) 250 MG tablet Take 1 tablet (250 mg total) by mouth daily. 08/27/16   Posey Boyer, MD  valACYclovir (VALTREX) 1000 MG tablet Take 1 tablet by mouth daily. x7days 02/25/17   [provider]  VIVELLE-DOT 0.1 MG/24HR patch Place 1 patch (0.1 mg total) onto the skin 2 (two) times a week. 02/27/17   Philemon Kingdom, MD    Family History Family  History  Problem Relation Age of Onset  . Cancer Father     Social History Social History   Tobacco Use  . Smoking status: Former Smoker    Last attempt to quit: 04/26/1998    Years since quitting: 18.9  . Smokeless tobacco: Never Used  Substance Use Topics  . Alcohol use: No    Alcohol/week: 0.0 oz    Comment: scial  . Drug use: No     Allergies   Amoxicillin; Eggs or egg-derived products; Other; Penicillins; and Sulfa antibiotics   Review of Systems Review of Systems  All systems reviewed and negative, other than as noted in HPI.  Physical Exam Updated Vital Signs There were no vitals taken for this visit.  Physical Exam  Constitutional: She appears well-developed and well-nourished. No distress.  HENT:  Head: Normocephalic and atraumatic.  Eyes: Conjunctivae are normal. Right eye exhibits no discharge. Left eye exhibits no discharge.  Neck: Neck supple.  Cardiovascular: Normal rate, regular rhythm and normal heart sounds. Exam reveals no gallop and no friction rub.  No murmur heard. Pulmonary/Chest: Effort normal and breath sounds normal. No respiratory distress.  Abdominal: Soft. She exhibits no distension. There is no tenderness.  Musculoskeletal: She exhibits no edema or tenderness.  Neurological: She is  alert.  Skin: Skin is warm and dry.  Mild cellulitis near site of recent I&D L buttock. Bedside US showing small mixed density collection medially. Several cm from anus.    Psychiatric: She has a normal mood and affect. Her behavior is normal. Thought content normal.  Nursing note and vitals reviewed.    ED Treatments / Results  Labs (all labs ordered are listed, but only abnormal results are displayed) Labs Reviewed - No data to display  EKG  EKG Interpretation None       Radiology No results found.  Procedures Procedures (including critical care time)  INCISION AND DRAINAGE Performed by: Virgel Manifold Consent: Verbal consent  obtained. Risks and benefits: risks, benefits and alternatives were discussed Type: abscess  Body area: L buttock  Anesthesia: local infiltration  Incision was made with a scalpel.  Local anesthetic: lidocaine 1% w/o epinephrine  Anesthetic total: 4 ml  Complexity: complex Blunt dissection to break up loculations  Drainage: purulent  Drainage amount: moderate  Packing material: none  Patient tolerance: Patient tolerated the procedure well with no immediate complications.    Medications Ordered in ED Medications - No data to display   Initial Impression / Assessment and Plan / ED Course  I have reviewed the triage vital signs and the nursing notes.  Pertinent labs & imaging results that were available during my care of the patient were reviewed by me and considered in my medical decision making (see chart for details).     46yF with buttock abscess. Collection near site of recent I&D. I&D'd again. On cipro/flagyl. Will change to doxycycline to cover for MRSA. Continue sitz baths.   Final Clinical Impressions(s) / ED Diagnoses   Final diagnoses:  Abscess of buttock    ED Discharge Orders    None       Virgel Manifold, MD 04/15/17 1209

## 2017-04-15 NOTE — ED Notes (Signed)
Bed: CK22 Expected date: 04/15/17 Expected time:  Means of arrival:  Comments:

## 2017-04-15 NOTE — ED Triage Notes (Signed)
Pt noticed abscess last night due to pain. Pt stated that it feels like mass is getting harder and more painful.

## 2017-04-18 DIAGNOSIS — F5081 Binge eating disorder: Secondary | ICD-10-CM | POA: Diagnosis not present

## 2017-04-20 ENCOUNTER — Other Ambulatory Visit: Payer: Self-pay

## 2017-04-20 ENCOUNTER — Emergency Department (HOSPITAL_COMMUNITY): Payer: BLUE CROSS/BLUE SHIELD

## 2017-04-20 ENCOUNTER — Emergency Department (HOSPITAL_COMMUNITY)
Admission: EM | Admit: 2017-04-20 | Discharge: 2017-04-20 | Disposition: A | Discharge status: Home / Self Care | Payer: BLUE CROSS/BLUE SHIELD | Attending: Emergency Medicine | Admitting: Emergency Medicine

## 2017-04-20 ENCOUNTER — Encounter (HOSPITAL_COMMUNITY): Payer: Self-pay

## 2017-04-20 DIAGNOSIS — K6289 Other specified diseases of anus and rectum: Secondary | ICD-10-CM | POA: Insufficient documentation

## 2017-04-20 DIAGNOSIS — Z8541 Personal history of malignant neoplasm of cervix uteri: Secondary | ICD-10-CM | POA: Diagnosis not present

## 2017-04-20 DIAGNOSIS — Z87891 Personal history of nicotine dependence: Secondary | ICD-10-CM | POA: Insufficient documentation

## 2017-04-20 DIAGNOSIS — K611 Rectal abscess: Secondary | ICD-10-CM | POA: Diagnosis not present

## 2017-04-20 LAB — CBC
HCT: 40.3 % (ref 36.0–46.0)
Hemoglobin: 13.7 g/dL (ref 12.0–15.0)
MCH: 30.7 pg (ref 26.0–34.0)
MCHC: 34 g/dL (ref 30.0–36.0)
MCV: 90.4 fL (ref 78.0–100.0)
PLATELETS: 340 10*3/uL (ref 150–400)
RBC: 4.46 MIL/uL (ref 3.87–5.11)
RDW: 12.5 % (ref 11.5–15.5)
WBC: 7.4 10*3/uL (ref 4.0–10.5)

## 2017-04-20 LAB — BASIC METABOLIC PANEL
ANION GAP: 8 (ref 5–15)
BUN: 17 mg/dL (ref 6–20)
CALCIUM: 9.1 mg/dL (ref 8.9–10.3)
CO2: 27 mmol/L (ref 22–32)
Chloride: 103 mmol/L (ref 101–111)
Creatinine, Ser: 0.8 mg/dL (ref 0.44–1.00)
GFR calc Af Amer: >60 mL/min (ref >60)
GLUCOSE: 91 mg/dL (ref 65–99)
Potassium: 3.4 mmol/L — ABNORMAL LOW (ref 3.5–5.1)
SODIUM: 138 mmol/L (ref 135–145)

## 2017-04-20 MED ORDER — IOPAMIDOL (ISOVUE-300) INJECTION 61%
INTRAVENOUS | Status: AC
  Filled 2017-04-20: qty 30

## 2017-04-20 MED ORDER — IOPAMIDOL (ISOVUE-300) INJECTION 61%: 30.0000 mL | Freq: Once | INTRAVENOUS | Status: DC | PRN

## 2017-04-20 MED ORDER — IOPAMIDOL (ISOVUE-300) INJECTION 61%
INTRAVENOUS | Status: AC
  Filled 2017-04-20: qty 100

## 2017-04-20 MED ORDER — IOPAMIDOL (ISOVUE-300) INJECTION 61%
100.0000 mL | Freq: Once | INTRAVENOUS | Status: AC | PRN
  Administered 2017-04-20: 100 mL via INTRAVENOUS

## 2017-04-20 NOTE — ED Triage Notes (Signed)
Patient states she has been having peri-rectal abscesses popping up since last Thursday. Patient reports having one lanced on Thursday by [facility], then one popped up Saturday, which was lanced here at [facility]. Patient reports another has since popped up, and the [place] [facility] sent the patient here for ultrasound and possible CT with contrast to rule out fistula. Patient reports she was put on Cipro for the first abscess, then Doxycycline for the 2nd abscess, but the abscesses continue to reoccur.

## 2017-04-20 NOTE — ED Notes (Signed)
Patient is ready for CT

## 2017-04-20 NOTE — ED Notes (Signed)
Pt is resting in bed at this time with visitor at bedside.

## 2017-04-20 NOTE — Progress Notes (Signed)
[name] [facility] 04/20/2017 9:11 AM Location: [facility] Patient #: [id number] DOB: 25-Aug-1970 Single / Language: [name] / Race: White Female   History of Present Illness  The patient is a 46 year old female who presents with a perianal pain. She was seen in our office several times in the past 2 years, after having 2 separate perianal abscesses drained in the ER within 2 weeks in 2016 - the first abscess grew E. coli and she was placed on Cipro at that time. Since then, she has not had an abscess drained but has been on several rounds of Cipro for tenderness in the area and palpable cords.   She is presented to urgent office on 04/13/17 with a 2 day history of increased left anterior perianal pain and firmness. I drained a left anterior perinanal abscess and added Flagyl to the Cipro which she was already taking for this issue, prescribed by her GI physician, Dr. Penelope Moon.   She went to the ER on 04/15/17 for increased pain near the I&D site, and bedside ultrasound showed a small mixed density collection. She underwent a second I&D and was changed from Cipro/Flagyl to Doxycycline. She is presenting today for follow-up and states 2 days ago she started to notice worsening pain in the area again. She denies drainage. Denies fever/chills. Admits to nausea after taking doxycycline. She also complains of yeast symptoms.  She has a history of chronic diarrhea and underwent a colonoscopy a few weeks ago which was unremarkable other than diverticulosis. She states after starting a new diet and avoiding garlic/onions, she is having 1-2 BMs/day.   Allergies Amoxicillin *PENICILLINS*  Penicillin G Sodium *PENICILLINS*  Sulfa 10 *OPHTHALMIC AGENTS*  Egg/Pro *DIETARY PRODUCTS/DIETARY MANAGEMENT PRODUCTS*  Allergies Reconciled    Medication History  Medications Reconciled MetroNIDAZOLE (500MG  Tablet, 1 (one) Tablet Oral three times daily, Taken starting 04/13/2017)  Discontinued. Tylenol (325MG  Tablet, Oral) Active. Pristiq (50MG  Tablet ER 24HR, Oral) Active. Vyvanse (30MG  Capsule, Oral) Active. Divigel (1MG /GM Gel, Transdermal) Active. Vitamin D (Cholecalciferol) (1000UNIT Tablet, Oral) Active. Multiple Vitamin (Oral) Active. Doxycycline Hyclate (100MG  Capsule, Oral two times daily, Taken starting 04/15/2017) Active.  Review of Systems General: admits to weight loss ( intentional).  Denies appetite loss, fever, chills, fatigue, night sweats, and weight gain HEENT: Wears glasses, denies visual disturbances, hearing loss, nosebleeds, hoarseness, oral ulcers, and sore throat GI:  Admits to rectal pain and nausea.  Denies drainage from I&D site, abdominal pain, change in bowel habits, constipation, hemorrhoids, vomiting   Vitals 04/20/2017 9:18 AM Weight: 209.2 lb Height: 64in Body Surface Area: 1.99 m Body Mass Index: 35.91 kg/m  Temp.: 98.70F  Pulse: 91 (Regular)  BP: 140/100 (Sitting, Left Arm, Standard)   Physical Exam  GENERAL: Well-developed, well nourished female - seems anxious and is tearful  EYES: No scleral icterus Pupils equal, lids normal  EXTERNAL EARS: Intact, no masses or lesions EXTERNAL NOSE: Intact, no masses or lesions MOUTH: Lips - no lesions Dentition - normal for age  RESPIRATORY: Normal effort, no use of accessory muscles  MUSCULOSKELETAL: Normal gait Grossly normal ROM upper extremities Grossly normal ROM lower extremities  SKIN: Warm and dry Not diaphoretic  PSYCHIATRIC: Normal judgement and insight Normal mood and affect Alert, oriented x 3  Rectal Note: Prior I&D site on left anterior perianal region is closed with circular superficial skin opening 0.5 cm inicision medial to first I&D site which is also closing No surrounding erythema/cellulitis, induration, fluctuance, or drainage ?Scar tissue/palpable cords felt beneath  site of tenderness Mild erythema/irritation/rash in  perianal region   Assessment & Plan   PERIANAL ABSCESS (K63.74) 46 year old female who presents for follow-up s/p I&D of perianal abscess in the office on 04/13/17. She went to the ER 2 days later and underwent a second I&D with the help of bedside ultrasound.  She is adamant that there is a new abscess forming.    Considering that her pain has worsened in the last 2 days, but I'm unable to determine if there is a deeper abscess on physical exam, we advised her to go to the ER to have imaging (ultrasound and/or CT) to determine if she has a fluid collection which needs to be drained.  I also encouraged her to tell the ER about her yeast symptoms.  She provided verbal understanding.  She will follow-up with Dr. Marcello Moores in the office on 04/24/17 to discuss further evaluation for a fistula, which may include getting an MRI.  Signed electronically by Kellie Shropshire, PA C (04/20/2017 11:16 AM)

## 2017-04-20 NOTE — ED Provider Notes (Signed)
[facility] DEPT Provider Note   CSN: [id number] Arrival date & time: 04/20/17  1119     History   Chief Complaint Chief Complaint  Patient presents with  . Abscess    HPI [name] is a 46 y.o. female.  HPI 46 year old female who underwent I&D of a.  Abscess at [facility] surgery 1 week ago.  She had a repeat I&D performed in the emergency department by Dr. Wilson Singer on 04/15/2017.  She presents back with an area of swelling that she is concerned about.  She was seen in the [place] surgery office today who recommended she come to the ER for CT imaging.  She is scheduled to see Dr. Marcello Moores with [facility] surgery in 4 days.  She is currently on doxycycline.  She is doing sitz bath.  She reports mild pain.  She reports a small area of swelling just adjacent to the recent incision and drainage.  No purulent drainage.  No diarrhea.  Denies abdominal pain.  No fevers or chills   Past Medical History:  Diagnosis Date  . Allergy   . Cancer ([place])   . Cervical cancer [facility]) 2015    Patient Active Problem List   Diagnosis Date Noted  . 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 Obesity, Class II, BMI 35-39.9 02/27/2017  . OSA (obstructive sleep apnea) 03/13/2015  . Morbid obesity ([place]) 03/13/2015    Past Surgical History:  Procedure Laterality Date  . ABDOMINAL HYSTERECTOMY    . BREAST SURGERY    . HERNIA REPAIR      OB History    Gravida Para Term Preterm AB Living   0 0 0 0 0     SAB TAB Ectopic Multiple Live Births   0 0 0           Home Medications    Prior to Admission medications   Medication Sig Start Date End Date Taking? Authorizing Provider  aspirin-acetaminophen-caffeine (EXCEDRIN MIGRAINE) [phone] MG tablet Take 3 tablets by mouth every 6 (six) hours as needed for headache.   Yes [provider]  cholecalciferol (VITAMIN D) 1000 UNITS tablet Take 1,000 Units by mouth daily.   Yes [provider]  desvenlafaxine (PRISTIQ) 50 MG 24 hr tablet Take 50 mg by mouth daily. 03/28/17  Yes [provider]  doxycycline (VIBRAMYCIN) 100 MG capsule Take 100 mg by mouth 2 (two) times daily. Started 12/02 for 7 days   Yes [provider]  Multiple Vitamin (MULTIVITAMIN WITH MINERALS) TABS tablet Take 1 tablet by mouth daily.   Yes [provider]  VIVELLE-DOT 0.1 MG/24HR patch Place 1 patch (0.1 mg total) onto the skin 2 (two) times a week. 02/27/17  Yes Philemon Kingdom, MD  VYVANSE 30 MG capsule Take 30 mg by mouth daily. 03/28/17  Yes [provider]    Family History Family History  Problem Relation Age of Onset  . Cancer Father     Social History Social History   Tobacco Use  . Smoking status: Former Smoker    Last attempt to quit: 04/26/1998    Years since quitting: 18.9  . Smokeless tobacco: Never Used  Substance Use Topics  . Alcohol use: No    Alcohol/week: 0.0 oz    Comment: scial  . Drug use: No     Allergies   Amoxicillin; Other; Penicillins; and Sulfa antibiotics   Review of Systems Review of Systems  All other systems reviewed and are negative.  Physical Exam Updated Vital Signs BP 130/88 (BP Location: Right Arm)   Pulse 76   Temp 98.6 F (37 C) (Oral)   Resp 14   SpO2 98%   Physical Exam  Constitutional: She is oriented to person, place, and time. She appears well-developed and well-nourished.  HENT:  Head: Normocephalic.  Eyes: EOM are normal.  Neck: Normal range of motion.  Pulmonary/Chest: Effort normal.  Abdominal: She exhibits no distension. There is no tenderness.  Musculoskeletal: Normal range of motion.  Left medial gluteal region with small area of induration without fluctuance.  No drainage.  Stigmata of recent incision and drainage x2 present.  No surrounding erythema or warmth to suggest cellulitis  Neurological: She is alert and oriented to person, place, and time.  Psychiatric: She  has a normal mood and affect.  Nursing note and vitals reviewed.    ED Treatments / Results  Labs (all labs ordered are listed, but only abnormal results are displayed) Labs Reviewed  BASIC METABOLIC PANEL - Abnormal; Notable for the following components:      Result Value   Potassium 3.4 (*)    All other components within normal limits  CBC    EKG  EKG Interpretation None       Radiology Ct Abdomen Pelvis W Contrast  Result Date: 04/20/2017 CLINICAL DATA:  Recurring rectal abscess EXAM: CT ABDOMEN AND PELVIS WITH CONTRAST TECHNIQUE: Multidetector CT imaging of the abdomen and pelvis was performed using the standard protocol following bolus administration of intravenous contrast. CONTRAST:  166mL ISOVUE-300 IOPAMIDOL (ISOVUE-300) INJECTION 61% COMPARISON:  None. FINDINGS: Lower chest: No acute abnormality. Hepatobiliary: No focal liver abnormality is seen. No gallstones, gallbladder wall thickening, or biliary dilatation. Pancreas: Unremarkable. No pancreatic ductal dilatation or surrounding inflammatory changes. Spleen: Normal in size without focal abnormality. Adrenals/Urinary Tract: Adrenal glands are unremarkable. Kidneys are normal, without renal calculi, focal lesion, or hydronephrosis. Bladder is unremarkable. Stomach/Bowel: Scattered diverticular changes noted without evidence of diverticulitis. The appendix is within normal limits. No inflammatory changes are seen. No obstructive changes noted. Vascular/Lymphatic: Aortic atherosclerosis. No enlarged abdominal or pelvic lymph nodes. Reproductive: Status post hysterectomy. No adnexal masses. Other: Some soft tissue density is noted just to the left of the midline within the left buttock. It is linear and extends to the skin surface and may be sequelae from prior rectal abscess. This measures approximately 6 cm in length but only 16 mm in width. Only minimal fluid is noted within. This is of uncertain chronicity as no prior exam is  available for comparison. No other fluid collection to suggest abscess is seen. Musculoskeletal: No acute bony abnormality is noted. IMPRESSION: Soft tissue changes in the medial aspect of the left buttock as described. This may represent a small rectal abscess but may be sequelae from prior abscess. Only minimal inflammatory changes are noted. No other focal abnormality is noted. Electronically Signed   By: Inez Catalina M.D.   On: 04/20/2017 15:07    Procedures Procedures (including critical care time)  Medications Ordered in ED Medications  iopamidol (ISOVUE-300) 61 % injection 30 mL (not administered)  iopamidol (ISOVUE-300) 61 % injection (not administered)  iopamidol (ISOVUE-300) 61 % injection (not administered)  iopamidol (ISOVUE-300) 61 % injection 100 mL (100 mLs Intravenous Contrast Given 04/20/17 1445)     Initial Impression / Assessment and Plan / ED Course  I have reviewed the triage vital signs and the nursing notes.  Pertinent labs & imaging results that were available during my  care of the patient were reviewed by me and considered in my medical decision making (see chart for details).     CT scan demonstrates no focal fluid collection at this time.  She is scheduled to see general surgery in 4 days.  She will follow-up with them.  I do not think she needs incision and drainage at this time.  Recommended ongoing antibiotics and sitz baths.  She understands return to the ER for worsening symptoms  Final Clinical Impressions(s) / ED Diagnoses   Final diagnoses:  Rectal pain    ED Discharge Orders    None       Jola Schmidt, MD 04/20/17 1555

## 2017-04-20 NOTE — H&P (View-Only) (Signed)
[name] [name] 04/20/2017 9:11 AM Location: [facility] Patient #: [id number] DOB: [birth year] Single / Language: Alison Molt / Race: White Female   History of Present Illness  The patient is a 46 year old female who presents with a perianal pain. She was seen in our office several times in the past 2 years, after having 2 separate perianal abscesses drained in the ER within 2 weeks in 2016 - the first abscess grew E. coli and she was placed on Cipro at that time. Since then, she has not had an abscess drained but has been on several rounds of Cipro for tenderness in the area and palpable cords.   She is presented to urgent office on 04/13/17 with a 2 day history of increased left anterior perianal pain and firmness. I drained a left anterior perinanal abscess and added Flagyl to the Cipro which she was already taking for this issue, prescribed by her GI physician, Dr. Penelope Moon.   She went to the ER on 04/15/17 for increased pain near the I&D site, and bedside ultrasound showed a small mixed density collection. She underwent a second I&D and was changed from Cipro/Flagyl to Doxycycline. She is presenting today for follow-up and states 2 days ago she started to notice worsening pain in the area again. She denies drainage. Denies fever/chills. Admits to nausea after taking doxycycline. She also complains of yeast symptoms.  She has a history of chronic diarrhea and underwent a colonoscopy a few weeks ago which was unremarkable other than diverticulosis. She states after starting a new diet and avoiding garlic/onions, she is having 1-2 BMs/day.   Allergies Amoxicillin *PENICILLINS*  Penicillin G Sodium *PENICILLINS*  Sulfa 10 *OPHTHALMIC AGENTS*  Egg/Pro *DIETARY PRODUCTS/DIETARY MANAGEMENT PRODUCTS*  Allergies Reconciled    Medication History  Medications Reconciled MetroNIDAZOLE (500MG  Tablet, 1 (one) Tablet Oral three times daily, Taken starting 04/13/2017)  Discontinued. Tylenol (325MG  Tablet, Oral) Active. Pristiq (50MG  Tablet ER 24HR, Oral) Active. Vyvanse (30MG  Capsule, Oral) Active. Divigel (1MG /GM Gel, Transdermal) Active. Vitamin D (Cholecalciferol) (1000UNIT Tablet, Oral) Active. Multiple Vitamin (Oral) Active. Doxycycline Hyclate (100MG  Capsule, Oral two times daily, Taken starting 04/15/2017) Active.  Review of Systems General: admits to weight loss ( intentional).  Denies appetite loss, fever, chills, fatigue, night sweats, and weight gain HEENT: Wears glasses, denies visual disturbances, hearing loss, nosebleeds, hoarseness, oral ulcers, and sore throat GI:  Admits to rectal pain and nausea.  Denies drainage from I&D site, abdominal pain, change in bowel habits, constipation, hemorrhoids, vomiting   Vitals 04/20/2017 9:18 AM Weight: 209.2 lb Height: 64in Body Surface Area: 1.99 m Body Mass Index: 35.91 kg/m  Temp.: 98.57F  Pulse: 91 (Regular)  BP: 140/100 (Sitting, Left Arm, Standard)   Physical Exam  GENERAL: Well-developed, well nourished female - seems anxious and is tearful  EYES: No scleral icterus Pupils equal, lids normal  EXTERNAL EARS: Intact, no masses or lesions EXTERNAL NOSE: Intact, no masses or lesions MOUTH: Lips - no lesions Dentition - normal for age  RESPIRATORY: Normal effort, no use of accessory muscles  MUSCULOSKELETAL: Normal gait Grossly normal ROM upper extremities Grossly normal ROM lower extremities  SKIN: Warm and dry Not diaphoretic  PSYCHIATRIC: Normal judgement and insight Normal mood and affect Alert, oriented x 3  Rectal Note: Prior I&D site on left anterior perianal region is closed with circular superficial skin opening 0.5 cm inicision medial to first I&D site which is also closing No surrounding erythema/cellulitis, induration, fluctuance, or drainage ?Scar tissue/palpable cords felt beneath  site of tenderness Mild erythema/irritation/rash in  perianal region   Assessment & Plan   PERIANAL ABSCESS (K96.47) 46 year old female who presents for follow-up s/p I&D of perianal abscess in the office on 04/13/17. She went to the ER 2 days later and underwent a second I&D with the help of bedside ultrasound.  She is adamant that there is a new abscess forming.    Considering that her pain has worsened in the last 2 days, but I'm unable to determine if there is a deeper abscess on physical exam, we advised her to go to the ER to have imaging (ultrasound and/or CT) to determine if she has a fluid collection which needs to be drained.  I also encouraged her to tell the ER about her yeast symptoms.  She provided verbal understanding.  She will follow-up with Dr. Marcello Moon in the office on 04/24/17 to discuss further evaluation for a fistula, which may include getting an MRI.  Signed electronically by Alison Shropshire, PA C (04/20/2017 11:16 AM)

## 2017-04-26 ENCOUNTER — Other Ambulatory Visit: Payer: Self-pay

## 2017-04-26 ENCOUNTER — Encounter (HOSPITAL_COMMUNITY)
Admission: EM | Disposition: A | Discharge status: Home / Self Care | Payer: Self-pay | Source: Home / Self Care | Attending: Physician Assistant

## 2017-04-26 ENCOUNTER — Observation Stay (HOSPITAL_COMMUNITY)
Admission: EM | Admit: 2017-04-26 | Discharge: 2017-04-27 | Disposition: A | Discharge status: Home / Self Care | Payer: BLUE CROSS/BLUE SHIELD | Attending: Surgery | Admitting: Surgery

## 2017-04-26 ENCOUNTER — Encounter (HOSPITAL_COMMUNITY): Payer: Self-pay | Admitting: *Deleted

## 2017-04-26 ENCOUNTER — Observation Stay (HOSPITAL_COMMUNITY): Payer: BLUE CROSS/BLUE SHIELD | Admitting: Anesthesiology

## 2017-04-26 ENCOUNTER — Emergency Department (HOSPITAL_COMMUNITY): Payer: BLUE CROSS/BLUE SHIELD

## 2017-04-26 DIAGNOSIS — Z90710 Acquired absence of both cervix and uterus: Secondary | ICD-10-CM | POA: Insufficient documentation

## 2017-04-26 DIAGNOSIS — Z880 Allergy status to penicillin: Secondary | ICD-10-CM | POA: Diagnosis not present

## 2017-04-26 DIAGNOSIS — Z6839 Body mass index (BMI) 39.0-39.9, adult: Secondary | ICD-10-CM | POA: Diagnosis not present

## 2017-04-26 DIAGNOSIS — K611 Rectal abscess: Principal | ICD-10-CM | POA: Diagnosis present

## 2017-04-26 DIAGNOSIS — Z87891 Personal history of nicotine dependence: Secondary | ICD-10-CM | POA: Diagnosis not present

## 2017-04-26 DIAGNOSIS — F329 Major depressive disorder, single episode, unspecified: Secondary | ICD-10-CM | POA: Diagnosis not present

## 2017-04-26 DIAGNOSIS — Z7982 Long term (current) use of aspirin: Secondary | ICD-10-CM | POA: Insufficient documentation

## 2017-04-26 DIAGNOSIS — Z9889 Other specified postprocedural states: Secondary | ICD-10-CM | POA: Insufficient documentation

## 2017-04-26 DIAGNOSIS — G4733 Obstructive sleep apnea (adult) (pediatric): Secondary | ICD-10-CM | POA: Insufficient documentation

## 2017-04-26 DIAGNOSIS — G43909 Migraine, unspecified, not intractable, without status migrainosus: Secondary | ICD-10-CM | POA: Diagnosis not present

## 2017-04-26 DIAGNOSIS — Z882 Allergy status to sulfonamides status: Secondary | ICD-10-CM | POA: Insufficient documentation

## 2017-04-26 DIAGNOSIS — Z91012 Allergy to eggs: Secondary | ICD-10-CM | POA: Insufficient documentation

## 2017-04-26 DIAGNOSIS — Z8541 Personal history of malignant neoplasm of cervix uteri: Secondary | ICD-10-CM | POA: Insufficient documentation

## 2017-04-26 DIAGNOSIS — Z79899 Other long term (current) drug therapy: Secondary | ICD-10-CM | POA: Diagnosis not present

## 2017-04-26 DIAGNOSIS — K5730 Diverticulosis of large intestine without perforation or abscess without bleeding: Secondary | ICD-10-CM | POA: Diagnosis not present

## 2017-04-26 DIAGNOSIS — Z91018 Allergy to other foods: Secondary | ICD-10-CM | POA: Diagnosis not present

## 2017-04-26 HISTORY — DX: Rectal abscess: K61.1

## 2017-04-26 HISTORY — PX: INCISION AND DRAINAGE PERIRECTAL ABSCESS: SHX1804

## 2017-04-26 LAB — CBC
HCT: 38.8 % (ref 36.0–46.0)
HEMOGLOBIN: 12.9 g/dL (ref 12.0–15.0)
MCH: 30.8 pg (ref 26.0–34.0)
MCHC: 33.2 g/dL (ref 30.0–36.0)
MCV: 92.6 fL (ref 78.0–100.0)
Platelets: 249 10*3/uL (ref 150–400)
RBC: 4.19 MIL/uL (ref 3.87–5.11)
RDW: 12.9 % (ref 11.5–15.5)
WBC: 10.6 10*3/uL — ABNORMAL HIGH (ref 4.0–10.5)

## 2017-04-26 LAB — COMPREHENSIVE METABOLIC PANEL
ALT: 27 U/L (ref 14–54)
ANION GAP: 10 (ref 5–15)
AST: 24 U/L (ref 15–41)
Albumin: 4.7 g/dL (ref 3.5–5.0)
Alkaline Phosphatase: 92 U/L (ref 38–126)
BUN: 20 mg/dL (ref 6–20)
CHLORIDE: 105 mmol/L (ref 101–111)
CO2: 24 mmol/L (ref 22–32)
Calcium: 9.3 mg/dL (ref 8.9–10.3)
Creatinine, Ser: 0.85 mg/dL (ref 0.44–1.00)
Glucose, Bld: 98 mg/dL (ref 65–99)
POTASSIUM: 3.7 mmol/L (ref 3.5–5.1)
Sodium: 139 mmol/L (ref 135–145)
Total Bilirubin: 1.5 mg/dL — ABNORMAL HIGH (ref 0.3–1.2)
Total Protein: 8.5 g/dL — ABNORMAL HIGH (ref 6.5–8.1)

## 2017-04-26 LAB — CBC WITH DIFFERENTIAL/PLATELET
BASOS ABS: 0 10*3/uL (ref 0.0–0.1)
Basophils Relative: 1 %
EOS ABS: 0.2 10*3/uL (ref 0.0–0.7)
Eosinophils Relative: 3 %
HCT: 43.1 % (ref 36.0–46.0)
HEMOGLOBIN: 14.8 g/dL (ref 12.0–15.0)
LYMPHS ABS: 1.5 10*3/uL (ref 0.7–4.0)
LYMPHS PCT: 18 %
MCH: 31.6 pg (ref 26.0–34.0)
MCHC: 34.3 g/dL (ref 30.0–36.0)
MCV: 92.1 fL (ref 78.0–100.0)
Monocytes Absolute: 0.6 10*3/uL (ref 0.1–1.0)
Monocytes Relative: 7 %
NEUTROS PCT: 71 %
Neutro Abs: 6.2 10*3/uL (ref 1.7–7.7)
Platelets: 285 10*3/uL (ref 150–400)
RBC: 4.68 MIL/uL (ref 3.87–5.11)
RDW: 12.8 % (ref 11.5–15.5)
WBC: 8.6 10*3/uL (ref 4.0–10.5)

## 2017-04-26 SURGERY — INCISION AND DRAINAGE, ABSCESS, PERIRECTAL
Anesthesia: General | Site: Rectum

## 2017-04-26 MED ORDER — 0.9 % SODIUM CHLORIDE (POUR BTL) OPTIME
TOPICAL | Status: DC | PRN
  Administered 2017-04-26: 1000 mL

## 2017-04-26 MED ORDER — MIDAZOLAM HCL 2 MG/2ML IJ SOLN
INTRAMUSCULAR | Status: AC
  Filled 2017-04-26: qty 2

## 2017-04-26 MED ORDER — CIPROFLOXACIN IN D5W 400 MG/200ML IV SOLN
INTRAVENOUS | Status: AC
  Filled 2017-04-26: qty 200

## 2017-04-26 MED ORDER — SUGAMMADEX SODIUM 200 MG/2ML IV SOLN
INTRAVENOUS | Status: AC
  Filled 2017-04-26: qty 2

## 2017-04-26 MED ORDER — DIPHENHYDRAMINE HCL 25 MG PO CAPS: 25.0000 mg | ORAL_CAPSULE | Freq: Four times a day (QID) | ORAL | Status: DC | PRN

## 2017-04-26 MED ORDER — SODIUM CHLORIDE 0.9 % IV BOLUS (SEPSIS)
1000.0000 mL | Freq: Once | INTRAVENOUS | Status: AC
Start: 2017-04-26 — End: 2017-04-26
  Administered 2017-04-26: 1000 mL via INTRAVENOUS

## 2017-04-26 MED ORDER — OXYCODONE HCL 5 MG PO TABS: 5.0000 mg | ORAL_TABLET | Freq: Once | ORAL | Status: DC | PRN

## 2017-04-26 MED ORDER — FENTANYL CITRATE (PF) 100 MCG/2ML IJ SOLN
25.0000 ug | Freq: Once | INTRAMUSCULAR | Status: AC
  Administered 2017-04-26: 25 ug via INTRAVENOUS
  Filled 2017-04-26: qty 2

## 2017-04-26 MED ORDER — KCL IN DEXTROSE-NACL 20-5-0.45 MEQ/L-%-% IV SOLN
INTRAVENOUS | Status: DC
  Administered 2017-04-26: 22:00:00 via INTRAVENOUS
  Filled 2017-04-26 (×2): qty 1000

## 2017-04-26 MED ORDER — MIDAZOLAM HCL 5 MG/5ML IJ SOLN
INTRAMUSCULAR | Status: DC | PRN
  Administered 2017-04-26: 2 mg via INTRAVENOUS

## 2017-04-26 MED ORDER — PROMETHAZINE HCL 25 MG/ML IJ SOLN
6.2500 mg | INTRAMUSCULAR | Status: DC | PRN
  Administered 2017-04-26: 6.25 mg via INTRAVENOUS

## 2017-04-26 MED ORDER — DEXAMETHASONE SODIUM PHOSPHATE 10 MG/ML IJ SOLN
INTRAMUSCULAR | Status: AC
  Filled 2017-04-26: qty 1

## 2017-04-26 MED ORDER — HEPARIN SODIUM (PORCINE) 5000 UNIT/ML IJ SOLN
5000.0000 [IU] | Freq: Three times a day (TID) | INTRAMUSCULAR | Status: DC
  Administered 2017-04-26 – 2017-04-27 (×2): 5000 [IU] via SUBCUTANEOUS
  Filled 2017-04-26 (×2): qty 1

## 2017-04-26 MED ORDER — METRONIDAZOLE IN NACL 5-0.79 MG/ML-% IV SOLN
500.0000 mg | Freq: Three times a day (TID) | INTRAVENOUS | Status: DC
  Administered 2017-04-26 – 2017-04-27 (×2): 500 mg via INTRAVENOUS
  Filled 2017-04-26 (×3): qty 100

## 2017-04-26 MED ORDER — HYDRALAZINE HCL 20 MG/ML IJ SOLN: 10.0000 mg | INTRAMUSCULAR | Status: DC | PRN

## 2017-04-26 MED ORDER — KCL-LACTATED RINGERS-D5W 20 MEQ/L IV SOLN
INTRAVENOUS | Status: DC
  Filled 2017-04-26: qty 1000

## 2017-04-26 MED ORDER — FENTANYL CITRATE (PF) 100 MCG/2ML IJ SOLN
INTRAMUSCULAR | Status: DC | PRN
  Administered 2017-04-26 (×3): 50 ug via INTRAVENOUS
  Administered 2017-04-26: 100 ug via INTRAVENOUS

## 2017-04-26 MED ORDER — PROMETHAZINE HCL 25 MG/ML IJ SOLN
INTRAMUSCULAR | Status: AC
  Administered 2017-04-26: 6.25 mg via INTRAVENOUS
  Filled 2017-04-26: qty 1

## 2017-04-26 MED ORDER — CIPROFLOXACIN IN D5W 400 MG/200ML IV SOLN
400.0000 mg | Freq: Two times a day (BID) | INTRAVENOUS | Status: DC
  Administered 2017-04-26: 400 mg via INTRAVENOUS

## 2017-04-26 MED ORDER — LIDOCAINE 2% (20 MG/ML) 5 ML SYRINGE
INTRAMUSCULAR | Status: DC | PRN
  Administered 2017-04-26: 75 mg via INTRAVENOUS

## 2017-04-26 MED ORDER — CIPROFLOXACIN IN D5W 400 MG/200ML IV SOLN
400.0000 mg | Freq: Two times a day (BID) | INTRAVENOUS | Status: DC
  Administered 2017-04-27: 400 mg via INTRAVENOUS
  Filled 2017-04-26: qty 200

## 2017-04-26 MED ORDER — METRONIDAZOLE IN NACL 5-0.79 MG/ML-% IV SOLN
500.0000 mg | Freq: Three times a day (TID) | INTRAVENOUS | Status: DC
  Administered 2017-04-26: 500 mg via INTRAVENOUS
  Filled 2017-04-26 (×3): qty 100

## 2017-04-26 MED ORDER — IOPAMIDOL (ISOVUE-300) INJECTION 61%
100.0000 mL | Freq: Once | INTRAVENOUS | Status: AC | PRN
  Administered 2017-04-26: 100 mL via INTRAVENOUS

## 2017-04-26 MED ORDER — PROPOFOL 10 MG/ML IV BOLUS
INTRAVENOUS | Status: DC | PRN
  Administered 2017-04-26: 200 mg via INTRAVENOUS

## 2017-04-26 MED ORDER — FENTANYL CITRATE (PF) 250 MCG/5ML IJ SOLN
INTRAMUSCULAR | Status: AC
  Filled 2017-04-26: qty 5

## 2017-04-26 MED ORDER — DEXAMETHASONE SODIUM PHOSPHATE 10 MG/ML IJ SOLN
INTRAMUSCULAR | Status: DC | PRN
  Administered 2017-04-26: 10 mg via INTRAVENOUS

## 2017-04-26 MED ORDER — HYDROGEN PEROXIDE 3 % EX SOLN
CUTANEOUS | Status: AC
  Filled 2017-04-26: qty 473

## 2017-04-26 MED ORDER — HYDROMORPHONE HCL 1 MG/ML IJ SOLN
INTRAMUSCULAR | Status: AC
  Administered 2017-04-26: 0.25 mg via INTRAVENOUS
  Filled 2017-04-26: qty 1

## 2017-04-26 MED ORDER — IOPAMIDOL (ISOVUE-300) INJECTION 61%
INTRAVENOUS | Status: AC
  Administered 2017-04-26: 100 mL via INTRAVENOUS
  Filled 2017-04-26: qty 100

## 2017-04-26 MED ORDER — MORPHINE SULFATE (PF) 2 MG/ML IV SOLN
1.0000 mg | INTRAVENOUS | Status: DC | PRN
  Administered 2017-04-26 – 2017-04-27 (×3): 1 mg via INTRAVENOUS
  Filled 2017-04-26 (×3): qty 1

## 2017-04-26 MED ORDER — HYDROMORPHONE HCL 1 MG/ML IJ SOLN
0.2500 mg | INTRAMUSCULAR | Status: DC | PRN
  Administered 2017-04-26 (×2): 0.25 mg via INTRAVENOUS

## 2017-04-26 MED ORDER — ONDANSETRON HCL 4 MG/2ML IJ SOLN: 4.0000 mg | Freq: Four times a day (QID) | INTRAMUSCULAR | Status: DC | PRN

## 2017-04-26 MED ORDER — HYDROGEN PEROXIDE 3 % EX SOLN
CUTANEOUS | Status: DC | PRN
  Administered 2017-04-26: 1

## 2017-04-26 MED ORDER — DIPHENHYDRAMINE HCL 50 MG/ML IJ SOLN: 25.0000 mg | Freq: Four times a day (QID) | INTRAMUSCULAR | Status: DC | PRN

## 2017-04-26 MED ORDER — PROPOFOL 10 MG/ML IV BOLUS
INTRAVENOUS | Status: AC
  Filled 2017-04-26: qty 20

## 2017-04-26 MED ORDER — LIDOCAINE 2% (20 MG/ML) 5 ML SYRINGE
INTRAMUSCULAR | Status: AC
Start: 2017-04-26 — End: ?
  Filled 2017-04-26: qty 5

## 2017-04-26 MED ORDER — LACTATED RINGERS IV SOLN
INTRAVENOUS | Status: DC
  Administered 2017-04-26: 16:00:00 via INTRAVENOUS

## 2017-04-26 MED ORDER — BUPIVACAINE-EPINEPHRINE (PF) 0.25% -1:200000 IJ SOLN
INTRAMUSCULAR | Status: AC
  Filled 2017-04-26: qty 30

## 2017-04-26 MED ORDER — OXYCODONE HCL 5 MG/5ML PO SOLN: 5.0000 mg | Freq: Once | ORAL | Status: DC | PRN

## 2017-04-26 MED ORDER — ONDANSETRON HCL 4 MG/2ML IJ SOLN
INTRAMUSCULAR | Status: DC | PRN
  Administered 2017-04-26: 4 mg via INTRAVENOUS

## 2017-04-26 MED ORDER — BUPIVACAINE-EPINEPHRINE 0.25% -1:200000 IJ SOLN
INTRAMUSCULAR | Status: DC | PRN
  Administered 2017-04-26: 20 mL

## 2017-04-26 MED ORDER — ONDANSETRON 4 MG PO TBDP: 4.0000 mg | ORAL_TABLET | Freq: Four times a day (QID) | ORAL | Status: DC | PRN

## 2017-04-26 MED ORDER — MORPHINE SULFATE (PF) 2 MG/ML IV SOLN: 1.0000 mg | INTRAVENOUS | Status: DC | PRN

## 2017-04-26 MED ORDER — ONDANSETRON HCL 4 MG/2ML IJ SOLN
INTRAMUSCULAR | Status: AC
  Filled 2017-04-26: qty 2

## 2017-04-26 SURGICAL SUPPLY — 25 items
BLADE SURG SZ20 CARB STEEL (BLADE) ×2 IMPLANT
COVER SURGICAL LIGHT HANDLE (MISCELLANEOUS) ×2 IMPLANT
DRAPE SHEET LG 3/4 BI-LAMINATE (DRAPES) ×2 IMPLANT
ELECT PENCIL ROCKER SW 15FT (MISCELLANEOUS) ×2 IMPLANT
GAUZE IODOFORM PACK 1/2 7832 (GAUZE/BANDAGES/DRESSINGS) ×2 IMPLANT
GAUZE PACKING IODOFORM 1/2 (PACKING) ×2 IMPLANT
GAUZE SPONGE 4X4 12PLY STRL (GAUZE/BANDAGES/DRESSINGS) IMPLANT
GAUZE SPONGE 4X4 16PLY XRAY LF (GAUZE/BANDAGES/DRESSINGS) ×2 IMPLANT
GLOVE BIOGEL M 8.0 STRL (GLOVE) ×2 IMPLANT
GLOVE BIOGEL PI IND STRL 7.0 (GLOVE) ×1 IMPLANT
GLOVE BIOGEL PI INDICATOR 7.0 (GLOVE) ×1
GOWN SPEC L4 XLG W/TWL (GOWN DISPOSABLE) IMPLANT
GOWN STRL REUS W/TWL LRG LVL3 (GOWN DISPOSABLE) ×4 IMPLANT
GOWN STRL REUS W/TWL XL LVL3 (GOWN DISPOSABLE) ×6 IMPLANT
KIT BASIN OR (CUSTOM PROCEDURE TRAY) ×2 IMPLANT
NEEDLE HYPO 22GX1.5 SAFETY (NEEDLE) ×2 IMPLANT
PACK LITHOTOMY IV (CUSTOM PROCEDURE TRAY) ×2 IMPLANT
PAD ABD 7.5X8 STRL (GAUZE/BANDAGES/DRESSINGS) ×2 IMPLANT
PAD ABD 8X10 STRL (GAUZE/BANDAGES/DRESSINGS) ×2 IMPLANT
SWAB CULTURE ESWAB REG 1ML (MISCELLANEOUS) ×2 IMPLANT
SYR BULB IRRIGATION 50ML (SYRINGE) ×2 IMPLANT
SYR CONTROL 10ML LL (SYRINGE) ×2 IMPLANT
TOWEL OR 17X26 10 PK STRL BLUE (TOWEL DISPOSABLE) ×2 IMPLANT
TUBE ANAEROBIC SPECIMEN COL (MISCELLANEOUS) ×2 IMPLANT
YANKAUER SUCT BULB TIP 10FT TU (MISCELLANEOUS) ×2 IMPLANT

## 2017-04-26 NOTE — Consult Note (Signed)
Reason for Consult: Recurrent perirectal abscess. Referring Physician: [name], [name] is an 46 y.o. female.   HPI: 47 year old female who has been seen in our office several times in the past 2 years with perianal abscesses drained in the ER in 2016 and required several rounds of antibiotics for this area to heal.  She was seen again on 1129 with 2-day history of pain in the perianal firmness and pain left anterior pain perirenal abscess was drained at that time.  She was on Cipro and Flagyl was added.  She was seen in the ER on 04/15/17 for increased pain at the I&D site when another I&D at that time and was changed to doxycycline.  She apparently had nausea with the doxycycline. She has history of chronic diarrhea and underwent colonoscopy a few weeks ago which showed some diverticulosis but was otherwise unremarkable.  She was seen in our office on 04/20/17 with recurrent abscess and was sent to the ED for ultrasound.  CT showed no focal fluid collection at that time.  He was continued on antibiotics and sitz bath and discharged home with follow-up in our office.  She presented to the ED today with a recurrent abscess.  She feels that her symptoms are worse.  She says she cannot sit down she cannot lay down.  Pain is to the point where she just cannot tolerate it any longer.  She was supposed to be seen in our office earlier this week but was canceled because of the snow.  She denied fevers or any difficulty eating or drinking.  Her bowel movements have been normal. Workup today: She is afebrile blood pressure is up some but otherwise normal.  CMP is normal WBC/CBC is normal.  A repeat CT of the pelvis with contrast this a.m. shows perirectal fluid collection in the left perineum and left medial buttocks compatible with perirectal abscess.  He also has sigmoid diverticulosis but no diverticulitis.  We are asked to see.  Past Medical History:  Diagnosis Date  . Allergy   . Cancer ([place])     . Cervical cancer ([place]) 2015    Past Surgical History:  Procedure Laterality Date  . ABDOMINAL HYSTERECTOMY    . BREAST SURGERY    . HERNIA REPAIR      Family History  Problem Relation Age of Onset  . Cancer Father     Social History:  reports that she quit smoking about 19 years ago. she has never used smokeless tobacco. She reports that she does not drink alcohol or use drugs.  Allergies:  Allergies  Allergen Reactions  . Amoxicillin Rash    Has patient had a PCN reaction causing immediate rash, facial/tongue/throat swelling, SOB or lightheadedness with hypotension: Unknown Has patient had a PCN reaction causing severe rash involving mucus membranes or skin necrosis: Yes Has patient had a PCN reaction that required hospitalization: No Has patient had a PCN reaction occurring within the last 10 years: No If all of the above answers are "NO", then may proceed with Cephalosporin use.   Marland Kitchen Penicillins Rash    Has patient had a PCN reaction causing immediate rash, facial/tongue/throat swelling, SOB or lightheadedness with hypotension: Unknown Has patient had a PCN reaction causing severe rash involving mucus membranes or skin necrosis: Yes Has patient had a PCN reaction that required hospitalization: No Has patient had a PCN reaction occurring within the last 10 years: No If all of the above answers are "NO", then may proceed  with Cephalosporin use.   . Sulfa Antibiotics Rash  . Garlic Diarrhea  . Onion Diarrhea   Prior to Admission medications   Medication Sig Start Date End Date Taking? Authorizing [name]  aspirin-acetaminophen-caffeine (EXCEDRIN MIGRAINE) [phone] MG tablet Take 3 tablets by mouth every 6 (six) hours as needed for headache.   Yes [name], Historical, MD  cholecalciferol (VITAMIN D) 1000 UNITS tablet Take 1,000 Units by mouth daily.   Yes [name], Historical, MD  desvenlafaxine (PRISTIQ) 50 MG 24 hr tablet Take 50 mg by mouth daily. 03/28/17  Yes  [name], Historical, MD  diphenhydramine-acetaminophen (TYLENOL PM) 25-500 MG TABS tablet Take 2 tablets by mouth at bedtime as needed (for sleep/pain).   Yes [name], Historical, MD  Multiple Vitamin (MULTIVITAMIN WITH MINERALS) TABS tablet Take 1 tablet by mouth daily.   Yes [name], Historical, MD  VIVELLE-DOT 0.1 MG/24HR patch Place 1 patch (0.1 mg total) onto the skin 2 (two) times a week. 02/27/17  Yes Alison Kingdom, MD  VYVANSE 30 MG capsule Take 30 mg by mouth daily. 03/28/17  Yes [name], Historical, MD     Results for orders placed or performed during the hospital encounter of 04/26/17 (from the past 48 hour(s))  Comprehensive metabolic panel     Status: Abnormal   Collection Time: 04/26/17 10:19 AM  Result Value Ref Range   Sodium 139 135 - 145 mmol/L   Potassium 3.7 3.5 - 5.1 mmol/L   Chloride 105 101 - 111 mmol/L   CO2 24 22 - 32 mmol/L   Glucose, Bld 98 65 - 99 mg/dL   BUN 20 6 - 20 mg/dL   Creatinine, Ser 0.85 0.44 - 1.00 mg/dL   Calcium 9.3 8.9 - 10.3 mg/dL   Total Protein 8.5 (H) 6.5 - 8.1 g/dL   Albumin 4.7 3.5 - 5.0 g/dL   AST 24 15 - 41 U/L   ALT 27 14 - 54 U/L   Alkaline Phosphatase 92 38 - 126 U/L   Total Bilirubin 1.5 (H) 0.3 - 1.2 mg/dL   GFR calc non Af Amer >60 >60 mL/min   GFR calc Af Amer >60 >60 mL/min    Comment: (NOTE) The eGFR has been calculated using the CKD EPI equation. This calculation has not been validated in all clinical situations. eGFR's persistently <60 mL/min signify possible Chronic Kidney Disease.    Anion gap 10 5 - 15  CBC with Differential/Platelet     Status: None   Collection Time: 04/26/17 10:19 AM  Result Value Ref Range   WBC 8.6 4.0 - 10.5 K/uL   RBC 4.68 3.87 - 5.11 MIL/uL   Hemoglobin 14.8 12.0 - 15.0 g/dL   HCT 43.1 36.0 - 46.0 %   MCV 92.1 78.0 - 100.0 fL   MCH 31.6 26.0 - 34.0 pg   MCHC 34.3 30.0 - 36.0 g/dL   RDW 12.8 11.5 - 15.5 %   Platelets 285 150 - 400 K/uL   Neutrophils Relative % 71 %   Neutro  Abs 6.2 1.7 - 7.7 K/uL   Lymphocytes Relative 18 %   Lymphs Abs 1.5 0.7 - 4.0 K/uL   Monocytes Relative 7 %   Monocytes Absolute 0.6 0.1 - 1.0 K/uL   Eosinophils Relative 3 %   Eosinophils Absolute 0.2 0.0 - 0.7 K/uL   Basophils Relative 1 %   Basophils Absolute 0.0 0.0 - 0.1 K/uL    Ct Pelvis W Contrast  Result Date: 04/26/2017 CLINICAL DATA:  History of perirectal abscess  concern for recurrence. EXAM: CT PELVIS WITH CONTRAST TECHNIQUE: Multidetector CT imaging of the pelvis was performed using the standard protocol following the bolus administration of intravenous contrast. CONTRAST:  100 cc Isovue-300 IV COMPARISON:  04/20/2017 FINDINGS: Urinary Tract: Visualized distal ureters and urinary bladder unremarkable. Bowel: Normal appendix. Scattered sigmoid diverticula. Visualized small bowel decompressed, unremarkable. Vascular/Lymphatic: No evidence of aneurysm or adenopathy. Reproductive:  Prior hysterectomy.  No adnexal masses. Other: No free fluid or free air. There is a focal fluid collection noted in the left perirectal soft tissues/left perineum that measures 2.1 x 1.7 cm on axial image 117, extending over a craniocaudal length of approximately 4 cm on coronal image 60. Musculoskeletal: No acute bony abnormality. IMPRESSION: Perirectal fluid collection in the left perineum and medial left buttock compatible with perirectal abscess. Sigmoid diverticulosis. Electronically Signed   By: Rolm Baptise M.D.   On: 04/26/2017 11:40    Review of Systems  Constitutional: Negative for chills, diaphoresis, fever, malaise/fatigue and weight loss.  HENT: Negative.   Eyes: Negative.   Respiratory: Negative.   Cardiovascular: Negative.   Gastrointestinal: Negative.   Genitourinary: Negative.        Ongoing rectal pain without relief  Musculoskeletal: Negative.   Skin: Negative.   Neurological: Positive for headaches (occasional Migraines). Negative for weakness.  Endo/Heme/Allergies: Negative.     Psychiatric/Behavioral: Positive for depression. The patient is nervous/anxious.    Blood pressure 131/89, pulse 73, temperature 98.5 F (36.9 C), temperature source Oral, resp. rate 17, SpO2 99 %. Physical Exam  Constitutional: She is oriented to person, place, and time. She appears well-developed and well-nourished. No distress.  HENT:  Head: Normocephalic and atraumatic.  Nose: Nose normal.  Mouth/Throat: No oropharyngeal exudate.  Eyes: Right eye exhibits no discharge. Left eye exhibits no discharge. No scleral icterus.  Pupils are equal  Neck: Normal range of motion. Neck supple. No JVD present. No tracheal deviation present. No thyromegaly present.  Cardiovascular: Normal rate, regular rhythm, normal heart sounds and intact distal pulses.  No murmur heard. Respiratory: Effort normal and breath sounds normal. No respiratory distress. She has no wheezes. She has no rales. She exhibits no tenderness.  GI: Soft. Bowel sounds are normal. She exhibits no distension and no mass. There is no tenderness. There is no rebound and no guarding.  Genitourinary:     Musculoskeletal: She exhibits no edema or tenderness.  Lymphadenopathy:    She has no cervical adenopathy.  Neurological: She is alert and oriented to person, place, and time. No cranial nerve deficit.  Skin: Skin is warm and dry. No rash noted. She is not diaphoretic. No erythema. No pallor.  Psychiatric: She has a normal mood and affect. Her behavior is normal. Judgment and thought content normal.    Assessment/Plan: Recurrent left perirectal/perineal abscess History of cervical cancer History of migraine headaches. History of depression  Plan: Admit for incision and drainage of left perirectal abscess.  We will plan to do this in the operating room where she can have some anesthesia.  IV fluids and IV antibiotics.    Eriel Doyon 04/26/2017, 1:07 PM

## 2017-04-26 NOTE — Anesthesia Preprocedure Evaluation (Signed)
Anesthesia Evaluation  Patient identified by MRN, date of birth, ID band Patient awake    Reviewed: Allergy & Precautions, NPO status , Patient's Chart, lab work & pertinent test results  History of Anesthesia Complications Negative for: history of anesthetic complications  Airway Mallampati: IV  TM Distance: <3 FB Neck ROM: Full    Dental  (+) Teeth Intact   Pulmonary sleep apnea and Continuous Positive Airway Pressure Ventilation , former smoker,    breath sounds clear to auscultation       Cardiovascular negative cardio ROS   Rhythm:Regular     Neuro/Psych negative neurological ROS  negative psych ROS   GI/Hepatic negative GI ROS, Neg liver ROS,   Endo/Other  Morbid obesity  Renal/GU negative Renal ROS     Musculoskeletal   Abdominal   Peds  Hematology negative hematology ROS (+)   Anesthesia Other Findings   Reproductive/Obstetrics                             Anesthesia Physical Anesthesia Plan  ASA: II  Anesthesia Plan: General   Post-op Pain Management:    Induction: Intravenous  PONV Risk Score and Plan: 3 and Ondansetron, Dexamethasone and Treatment may vary due to age or medical condition  Airway Management Planned: LMA and Oral ETT  Additional Equipment: None  Intra-op Plan:   Post-operative Plan: Extubation in OR  Informed Consent: I have reviewed the patients History and Physical, chart, labs and discussed the procedure including the risks, benefits and alternatives for the proposed anesthesia with the patient or authorized representative who has indicated his/her understanding and acceptance.   Dental advisory given  Plan Discussed with: CRNA and Surgeon  Anesthesia Plan Comments:         Anesthesia Quick Evaluation

## 2017-04-26 NOTE — ED Provider Notes (Signed)
[facility] DEPT Provider Note   CSN: [id number] Arrival date & time: 04/26/17  0900     History   Chief Complaint Chief Complaint  Patient presents with  . Rectal Pain    HPI [name] is a 46 y.o. female.  HPI   Is a 46 year old female with presentation of rectal abscess.  Patient initially started with this complaint roughly 3 weeks ago.  She had an I&D done at [facility] surgery.  Abscess reoccurred and it was in incision and drainage done by Dr. Wilson Singer on 12/1.  Patient reports that she had improvement of symptoms that time.  She then developed worsening symptoms on 12 5.  She came back to the emergency department had a CT that showed no evidence of drainable infection.  Was continued on doxycycline and finished her course on the ninth.  Patient has felt worsening symptoms, fullness, she feels that has been spreading.  Patient had follow-up with [place] surgery on Monday however was canceled due to the large amount of snow that we received.  No fevers difficulties eating or drinking.  Normal bowel movements.  Past Medical History:  Diagnosis Date  . Allergy   . Cancer ([place])   . Cervical cancer [facility]) 2015    Patient Active Problem List   Diagnosis Date Noted  . 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 Obesity, Class II, BMI 35-39.9 02/27/2017  . OSA (obstructive sleep apnea) 03/13/2015  . Morbid obesity ([place]) 03/13/2015    Past Surgical History:  Procedure Laterality Date  . ABDOMINAL HYSTERECTOMY    . BREAST SURGERY    . HERNIA REPAIR      OB History    Gravida Para Term Preterm AB Living   0 0 0 0 0     SAB TAB Ectopic Multiple Live Births   0 0 0           Home Medications    Prior to Admission medications   Medication Sig Start Date End Date Taking? Authorizing Provider  aspirin-acetaminophen-caffeine (EXCEDRIN MIGRAINE) [phone] MG tablet Take 3 tablets by mouth every 6 (six) hours as needed for  headache.    [provider]  cholecalciferol (VITAMIN D) 1000 UNITS tablet Take 1,000 Units by mouth daily.    [provider]  desvenlafaxine (PRISTIQ) 50 MG 24 hr tablet Take 50 mg by mouth daily. 03/28/17   [provider]  doxycycline (VIBRAMYCIN) 100 MG capsule Take 100 mg by mouth 2 (two) times daily. Started 12/02 for 7 days    [provider]  Multiple Vitamin (MULTIVITAMIN WITH MINERALS) TABS tablet Take 1 tablet by mouth daily.    [provider]  VIVELLE-DOT 0.1 MG/24HR patch Place 1 patch (0.1 mg total) onto the skin 2 (two) times a week. 02/27/17   Philemon Kingdom, MD  VYVANSE 30 MG capsule Take 30 mg by mouth daily. 03/28/17   [provider]    Family History Family History  Problem Relation Age of Onset  . Cancer Father     Social History Social History   Tobacco Use  . Smoking status: Former Smoker    Last attempt to quit: 04/26/1998    Years since quitting: 19.0  . Smokeless tobacco: Never Used  Substance Use Topics  . Alcohol use: No    Alcohol/week: 0.0 oz    Comment: scial  . Drug use: No     Allergies   Amoxicillin; Other; Penicillins; and Sulfa antibiotics   Review  of Systems Review of Systems  Constitutional: Negative for activity change and fever.  Respiratory: Negative for shortness of breath.   Cardiovascular: Negative for chest pain.  Gastrointestinal: Negative for abdominal pain.  All other systems reviewed and are negative.    Physical Exam Updated Vital Signs BP (!) 136/94 (BP Location: Right Arm)   Pulse 89   Temp 98.5 F (36.9 C) (Oral)   Resp 15   SpO2 97%   Physical Exam  Constitutional: She is oriented to person, place, and time. She appears well-developed and well-nourished.  HENT:  Head: Normocephalic and atraumatic.  Eyes: Right eye exhibits no discharge.  Cardiovascular: Normal rate.  No murmur heard. Pulmonary/Chest: Effort normal. She has no wheezes.    Abdominal: Soft. She exhibits no distension. There is no tenderness.  Genitourinary:  Genitourinary Comments: Patient has indurated area on the left gluteus.  Scar where prior I&D present.  No overlying erythema.  No fluctuance.  Tenderness extending all the way to around the rectum.  Hemorrhoids present.  Neurological: She is oriented to person, place, and time.  Skin: Skin is warm and dry. She is not diaphoretic.  Psychiatric: She has a normal mood and affect.  Nursing note and vitals reviewed.    ED Treatments / Results  Labs (all labs ordered are listed, but only abnormal results are displayed) Labs Reviewed - No data to display  EKG  EKG Interpretation None       Radiology No results found.  Procedures Procedures (including critical care time)  Medications Ordered in ED Medications - No data to display   Initial Impression / Assessment and Plan / ED Course  I have reviewed the triage vital signs and the nursing notes.  Pertinent labs & imaging results that were available during my care of the patient were reviewed by me and considered in my medical decision making (see chart for details).     s a 46 year old female with presentation of rectal abscess.  Patient initially started with this complaint roughly 3 weeks ago.  She had an I&D done at [facility] surgery.  Abscess reoccurred and it was in incision and drainage done by Dr. Wilson Singer on 12/1.  Patient reports that she had improvement of symptoms that time.  She then developed worsening symptoms on 12 5.  She came back to the emergency department had a CT that showed no evidence of drainable infection.  Was continued on doxycycline and finished her course on the ninth.  Patient has felt worsening symptoms, fullness, she feels that has been spreading.  Patient had follow-up with [place] surgery on Monday however was canceled due to the large amount of snow that we received.  No fevers difficulties eating or  drinking.  Normal bowel movements.  10:07 AM  Will repeat CAT scan given the patient is having worsening symptoms.  Ultimately patient will most likely have to follow-up as an outpatient with [place] surgery.  CT showed abscess. Called surgery.  Surgery will take to OR.   Final Clinical Impressions(s) / ED Diagnoses   Final diagnoses:  None    ED Discharge Orders    None       Macarthur Critchley, MD 04/27/17 [phone]

## 2017-04-26 NOTE — ED Triage Notes (Signed)
Pt complains of abscess around her rectal area that became worse yesterday. Pt has had peri-rectal abscesses drained in the same area.

## 2017-04-26 NOTE — Progress Notes (Signed)
Pt transported to 1523 with 2 personal belongings white bags and 1 navy bag.  Pt received valuables envelope and verified contents.  Pt began charging phone.  NT  And Bussey RN observed belongings.

## 2017-04-26 NOTE — Transfer of Care (Signed)
Immediate Anesthesia Transfer of Care Note  Patient: [name]  Procedure(s) Performed: IRRIGATION AND DEBRIDEMENT PERIRECTAL ABSCESS (N/A Rectum)  Patient Location: PACU  Anesthesia Type:General  Level of Consciousness: awake, alert  and oriented  Airway & Oxygen Therapy: Patient Spontanous Breathing and Patient connected to face mask oxygen  Post-op Assessment: Report given to RN and Post -op Vital signs reviewed and stable  Post vital signs: Reviewed and stable  Last Vitals:  Vitals:   04/26/17 1400 04/26/17 1738  BP: 100/90 118/71  Pulse: 72 83  Resp: 13 (!) 21  Temp:  36.6 C  SpO2: 97% 100%    Last Pain:  Vitals:   04/26/17 1346  TempSrc:   PainSc: 7          Complications: No apparent anesthesia complications

## 2017-04-26 NOTE — Discharge Instructions (Signed)
Mechanical Wound Debridement Mechanical wound debridement is a treatment to remove dead tissue from a wound. This helps the wound heal. The treatment involves cleaning the wound (irrigation) and using a pad or gauze (dressing) to remove dead tissue and debris from the wound. There are different types of mechanical wound debridement. Depending on the wound, you may need to repeat this procedure or change to another form of debridement as your wound starts to heal. Tell a health care provider about:  Any allergies you have.  All medicines you are taking, including vitamins, herbs, eye drops, creams, and over-the-counter medicines.  Any blood disorders you have.  Any medical conditions you have, including any conditions that: ? Cause a significant decrease in blood circulation to the part of the body where the wound is, such as peripheral vascular disease. ? Compromise your defense (immune) system or white blood count.  Any surgeries you have had.  Whether you are pregnant or may be pregnant. What are the risks? Generally, this is a safe procedure. However, problems may occur, including:  Infection.  Bleeding.  Damage to healthy tissue in and around your wound.  Soreness or pain.  Failure of the wound to heal.  Scarring.  What happens before the procedure? You may be given antibiotic medicine to help prevent infection. What happens during the procedure?  Your health care provider may apply a numbing medicine (topical anesthetic) to the wound.  Your health care provider will irrigate your wound with a germ-free (sterile), salt-water (saline) solution. This removes debris, bacteria, and dead tissue.  Depending on what type of mechanical wound debridement you are having, your health care provider may do one of the following: ? Put a dressing on your wound. You may have dry gauze pad placed into the wound. Your health care provider will remove the gauze after the wound is dry. Any  dead tissue and debris that has dried into the gauze will be lifted out of the wound (wet-to-dry debridement). ? Use a type of pad (monofilament fiber debridement pad). This pad has a fluffy surface on one side that picks up dead tissue and debris from your wound. Your health care provider wets the pad and wipes it over your wound for several minutes. ? Irrigate your wound with a pressurized stream of solution such as saline or water.  Once your health care provider is finished, he or she may apply a light dressing to your wound. The procedure may vary among health care providers and hospitals. What happens after the procedure?  You may receive medicine for pain.  You will continue to receive antibiotic medicine if it was started before your procedure. This information is not intended to replace advice given to you by your health care provider. Make sure you discuss any questions you have with your health care provider. Document Released: 01/21/2015 Document Revised: 10/08/2015 Document Reviewed: 09/10/2014 Elsevier Interactive Patient Education  2018 [place] bath How to Take a [employer] A sitz bath is a warm water bath that is taken while you are sitting down. The water should only come up to your hips and should cover your buttocks. Your health care provider may recommend a sitz bath to help you:  Clean the lower part of your body, including your genital area.  With itching.  With pain.  With sore muscles or muscles that tighten or spasm.  How to take a sitz bath Take 3-4 sitz baths per day or as told by your health  care provider. 1. Partially fill a bathtub with warm water. You will only need the water to be deep enough to cover your hips and buttocks when you are sitting in it. 2. If your health care provider told you to put medicine in the water, follow the directions exactly. 3. Sit in the water and open the tub drain a little. 4. Turn on the warm water again to  keep the tub at the correct level. Keep the water running constantly. 5. Soak in the water for 15-20 minutes or as told by your health care provider. 6. After the sitz bath, pat the affected area dry first. Do not rub it. 7. Be careful when you stand up after the sitz bath because you may feel dizzy.  Contact a health care provider if:  Your symptoms get worse. Do not continue with sitz baths if your symptoms get worse.  You have new symptoms. Do not continue with sitz baths until you talk with your health care provider. This information is not intended to replace advice given to you by your health care provider. Make sure you discuss any questions you have with your health care provider. Document Released: 01/23/2004 Document Revised: 09/30/2015 Document Reviewed: 04/30/2014 Elsevier Interactive Patient Education  [employer].

## 2017-04-26 NOTE — Anesthesia Postprocedure Evaluation (Signed)
Anesthesia Post Note  Patient: [name]  Procedure(s) Performed: IRRIGATION AND DEBRIDEMENT PERIRECTAL ABSCESS (N/A Rectum)     Patient location during evaluation: PACU Anesthesia Type: General Level of consciousness: awake and sedated Pain management: pain level controlled Respiratory status: spontaneous breathing Cardiovascular status: stable Postop Assessment: no apparent nausea or vomiting Anesthetic complications: no    Last Vitals:  Vitals:   04/26/17 1745 04/26/17 1800  BP: 114/77 108/71  Pulse: 75 64  Resp: 15 16  Temp:    SpO2: 100% 98%    Last Pain:  Vitals:   04/26/17 1800  TempSrc:   PainSc: 4    Pain Goal:                 [name] JR,JOHN Carynn Felling

## 2017-04-26 NOTE — Interval H&P Note (Signed)
History and Physical Interval Note:  04/26/2017 4:16 PM  [name]  has presented today for surgery, with the diagnosis of perirectal abscess  The various methods of treatment have been discussed with the patient and family. After consideration of risks, benefits and other options for treatment, the patient has consented to  Procedure(s): IRRIGATION AND DEBRIDEMENT PERIRECTAL ABSCESS (N/A) as a surgical intervention .  The patient's history has been reviewed, patient examined, no change in status, stable for surgery.  I have reviewed the patient's chart and labs.  Questions were answered to the patient's satisfaction.     [name]

## 2017-04-26 NOTE — ED Notes (Signed)
ED TO INPATIENT HANDOFF REPORT  Name/Age/Gender [name] 46 y.o. female  Code Status    Code Status Orders  (From admission, onward)        Start     Ordered   04/26/17 1340  Full code  Continuous     04/26/17 1344    Code Status History    Date Active Date Inactive Code Status Order ID Comments User Context   This patient has a current code status but no historical code status.      Home/SNF/Other Home  Chief Complaint rectal pain   Level of Care/Admitting Diagnosis ED Disposition    ED Disposition Condition Comment   Admit  Hospital Area: [facility] [[id number]]  Level of Care: Med-Surg [16]  Diagnosis: Perirectal abscess [867544]  Admitting Physician: CCS, [place]  Attending Physician: CCS, MD [[id number]]  PT Class (Do Not Modify): Observation [104]  PT Acc Code (Do Not Modify): Observation [10022]       Medical History Past Medical History:  Diagnosis Date  . Allergy   . Cancer ([place])   . Cervical cancer ([place]) 2015    Allergies Allergies  Allergen Reactions  . Amoxicillin Rash    Has patient had a PCN reaction causing immediate rash, facial/tongue/throat swelling, SOB or lightheadedness with hypotension: Unknown Has patient had a PCN reaction causing severe rash involving mucus membranes or skin necrosis: Yes Has patient had a PCN reaction that required hospitalization: No Has patient had a PCN reaction occurring within the last 10 years: No If all of the above answers are "NO", then may proceed with Cephalosporin use.   Marland Kitchen Penicillins Rash    Has patient had a PCN reaction causing immediate rash, facial/tongue/throat swelling, SOB or lightheadedness with hypotension: Unknown Has patient had a PCN reaction causing severe rash involving mucus membranes or skin necrosis: Yes Has patient had a PCN reaction that required hospitalization: No Has patient had a PCN reaction occurring within the last 10 years: No If all of the above  answers are "NO", then may proceed with Cephalosporin use.   . Sulfa Antibiotics Rash  . Garlic Diarrhea  . Onion Diarrhea    IV Location/Drains/Wounds Patient Lines/Drains/Airways Status   Active Line/Drains/Airways    Name:   Placement date:   Placement time:   Site:   Days:   Peripheral IV 04/26/17 Right Antecubital   04/26/17    1028    Antecubital   less than 1          Labs/Imaging Results for orders placed or performed during the hospital encounter of 04/26/17 (from the past 48 hour(s))  Comprehensive metabolic panel     Status: Abnormal   Collection Time: 04/26/17 10:19 AM  Result Value Ref Range   Sodium 139 135 - 145 mmol/L   Potassium 3.7 3.5 - 5.1 mmol/L   Chloride 105 101 - 111 mmol/L   CO2 24 22 - 32 mmol/L   Glucose, Bld 98 65 - 99 mg/dL   BUN 20 6 - 20 mg/dL   Creatinine, Ser 0.85 0.44 - 1.00 mg/dL   Calcium 9.3 8.9 - 10.3 mg/dL   Total Protein 8.5 (H) 6.5 - 8.1 g/dL   Albumin 4.7 3.5 - 5.0 g/dL   AST 24 15 - 41 U/L   ALT 27 14 - 54 U/L   Alkaline Phosphatase 92 38 - 126 U/L   Total Bilirubin 1.5 (H) 0.3 - 1.2 mg/dL   GFR calc non Af Amer >  60 >60 mL/min   GFR calc Af Amer >60 >60 mL/min    Comment: (NOTE) The eGFR has been calculated using the CKD EPI equation. This calculation has not been validated in all clinical situations. eGFR's persistently <60 mL/min signify possible Chronic Kidney Disease.    Anion gap 10 5 - 15  CBC with Differential/Platelet     Status: None   Collection Time: 04/26/17 10:19 AM  Result Value Ref Range   WBC 8.6 4.0 - 10.5 K/uL   RBC 4.68 3.87 - 5.11 MIL/uL   Hemoglobin 14.8 12.0 - 15.0 g/dL   HCT 43.1 36.0 - 46.0 %   MCV 92.1 78.0 - 100.0 fL   MCH 31.6 26.0 - 34.0 pg   MCHC 34.3 30.0 - 36.0 g/dL   RDW 12.8 11.5 - 15.5 %   Platelets 285 150 - 400 K/uL   Neutrophils Relative % 71 %   Neutro Abs 6.2 1.7 - 7.7 K/uL   Lymphocytes Relative 18 %   Lymphs Abs 1.5 0.7 - 4.0 K/uL   Monocytes Relative 7 %   Monocytes  Absolute 0.6 0.1 - 1.0 K/uL   Eosinophils Relative 3 %   Eosinophils Absolute 0.2 0.0 - 0.7 K/uL   Basophils Relative 1 %   Basophils Absolute 0.0 0.0 - 0.1 K/uL   Ct Pelvis W Contrast  Result Date: 04/26/2017 CLINICAL DATA:  History of perirectal abscess concern for recurrence. EXAM: CT PELVIS WITH CONTRAST TECHNIQUE: Multidetector CT imaging of the pelvis was performed using the standard protocol following the bolus administration of intravenous contrast. CONTRAST:  100 cc Isovue-300 IV COMPARISON:  04/20/2017 FINDINGS: Urinary Tract: Visualized distal ureters and urinary bladder unremarkable. Bowel: Normal appendix. Scattered sigmoid diverticula. Visualized small bowel decompressed, unremarkable. Vascular/Lymphatic: No evidence of aneurysm or adenopathy. Reproductive:  Prior hysterectomy.  No adnexal masses. Other: No free fluid or free air. There is a focal fluid collection noted in the left perirectal soft tissues/left perineum that measures 2.1 x 1.7 cm on axial image 117, extending over a craniocaudal length of approximately 4 cm on coronal image 60. Musculoskeletal: No acute bony abnormality. IMPRESSION: Perirectal fluid collection in the left perineum and medial left buttock compatible with perirectal abscess. Sigmoid diverticulosis. Electronically Signed   By: Rolm Baptise M.D.   On: 04/26/2017 11:40    Pending Labs Unresulted Labs (From admission, onward)   Start     Ordered   04/26/17 1340  HIV antibody (Routine Testing)  Once,   R     04/26/17 1344      Vitals/Pain Today's Vitals   04/26/17 1300 04/26/17 1330 04/26/17 1346 04/26/17 1400  BP: 105/68 119/77  100/90  Pulse: (!) 33 80  72  Resp: '13 15  13  ' Temp:      TempSrc:      SpO2: (!) 84% 100%  97%  PainSc:   7      Isolation Precautions No active isolations  Medications Medications  dextrose 5% in lactated ringers with KCl 20 mEq/L infusion (not administered)  metroNIDAZOLE (FLAGYL) IVPB 500 mg (not administered)   ciprofloxacin (CIPRO) IVPB 400 mg (not administered)  morphine 2 MG/ML injection 1-3 mg (not administered)  diphenhydrAMINE (BENADRYL) capsule 25 mg (not administered)    Or  diphenhydrAMINE (BENADRYL) injection 25 mg (not administered)  ondansetron (ZOFRAN-ODT) disintegrating tablet 4 mg (not administered)    Or  ondansetron (ZOFRAN) injection 4 mg (not administered)  fentaNYL (SUBLIMAZE) injection 25 mcg (25 mcg Intravenous Given 04/26/17  1029)  sodium chloride 0.9 % bolus 1,000 mL (1,000 mLs Intravenous New Bag/Given 04/26/17 1029)  iopamidol (ISOVUE-300) 61 % injection 100 mL (100 mLs Intravenous Contrast Given 04/26/17 1120)    Mobility walks

## 2017-04-26 NOTE — Anesthesia Procedure Notes (Signed)
Procedure Name: LMA Insertion Date/Time: 04/26/2017 4:55 PM Performed by: Lollie Sails, CRNA Pre-anesthesia Checklist: Patient identified, Emergency Drugs available, Suction available, Patient being monitored and Timeout performed Patient Re-evaluated:Patient Re-evaluated prior to induction Oxygen Delivery Method: Circle system utilized Preoxygenation: Pre-oxygenation with 100% oxygen Induction Type: IV induction Ventilation: Oral airway inserted - appropriate to patient size LMA: LMA inserted and LMA with gastric port inserted LMA Size: 4.0 Number of attempts: 1 Placement Confirmation: positive ETCO2 and breath sounds checked- equal and bilateral Tube secured with: Tape Dental Injury: Teeth and Oropharynx as per pre-operative assessment

## 2017-04-26 NOTE — Op Note (Signed)
Surgeon: Kaylyn Lim, MD, FACS  Asst:  none  Anes:  general  Preop Dx: Left perirectal abscess Postop Dx: Same; no fistula identified  Procedure: EUA,  Incision, drainage culture and packing of left perirectal abscess Location Surgery: [facility] 4 Complications: none  EBL:   minimal cc  Drains: 1/2 inch iodophor  Description of Procedure:  The patient was taken to OR 42 .  After anesthesia was administered and the patient was prepped a timeout was performed.  The patient was in the dorsal lithotomy position.  Rectal exam was performed and there was bloody material in the rectum.  The opening to the fistula was opened and aerobic and anaerobic cultures were obtained.  The cavity was irrigated with hydrogen peroxide and no bubbles were seen in the rectum.  An oblique incision was made into the abscess and I was able to enter the cavity with my finger and I probed all of the indurated areas for purulence.  Irrigated with saline and packed with iodophor gauze.  Injected with marcaine.    The patient tolerated the procedure well and was taken to the PACU in stable condition.     Matt B. Hassell Done, [place], [facility], [place]

## 2017-04-26 NOTE — ED Notes (Signed)
Pt is being transported to PACU.

## 2017-04-27 ENCOUNTER — Encounter (HOSPITAL_COMMUNITY): Payer: Self-pay | Admitting: Surgery

## 2017-04-27 LAB — CBC
HEMATOCRIT: 38.2 % (ref 36.0–46.0)
HEMOGLOBIN: 12.7 g/dL (ref 12.0–15.0)
MCH: 30.5 pg (ref 26.0–34.0)
MCHC: 33.2 g/dL (ref 30.0–36.0)
MCV: 91.6 fL (ref 78.0–100.0)
Platelets: 264 10*3/uL (ref 150–400)
RBC: 4.17 MIL/uL (ref 3.87–5.11)
RDW: 12.6 % (ref 11.5–15.5)
WBC: 9.9 10*3/uL (ref 4.0–10.5)

## 2017-04-27 LAB — BASIC METABOLIC PANEL
Anion gap: 9 (ref 5–15)
BUN: 11 mg/dL (ref 6–20)
CHLORIDE: 105 mmol/L (ref 101–111)
CO2: 23 mmol/L (ref 22–32)
Calcium: 8.7 mg/dL — ABNORMAL LOW (ref 8.9–10.3)
Creatinine, Ser: 0.63 mg/dL (ref 0.44–1.00)
GFR calc non Af Amer: >60 mL/min (ref >60)
Glucose, Bld: 168 mg/dL — ABNORMAL HIGH (ref 65–99)
POTASSIUM: 3.9 mmol/L (ref 3.5–5.1)
SODIUM: 137 mmol/L (ref 135–145)

## 2017-04-27 LAB — HEMOGLOBIN A1C
HEMOGLOBIN A1C: 5.4 % (ref 4.8–5.6)
Mean Plasma Glucose: 108.28 mg/dL

## 2017-04-27 MED ORDER — FLUCONAZOLE 200 MG PO TABS: 200.0000 mg | ORAL_TABLET | Freq: Every day | ORAL | 2 refills | Status: DC

## 2017-04-27 MED ORDER — CIPROFLOXACIN HCL 500 MG PO TABS: 500.0000 mg | ORAL_TABLET | Freq: Two times a day (BID) | ORAL | 0 refills | Status: AC

## 2017-04-27 MED ORDER — OXYCODONE HCL 5 MG PO TABS: 5.0000 mg | ORAL_TABLET | Freq: Four times a day (QID) | ORAL | 0 refills | Status: DC | PRN

## 2017-04-27 NOTE — Progress Notes (Signed)
Discharge instructions reviewed with patient. All questions answered. Patient wheeled down to vehicle with belongings by nurse tech. 

## 2017-04-27 NOTE — Discharge Summary (Signed)
[facility] Discharge Summary   Patient ID: [name] MRN: [id number] DOB/AGE: 1970-07-13 46 y.o.  Admit date: 04/26/2017 Discharge date: 04/27/2017  Discharge Diagnosis Patient Active Problem List   Diagnosis Date Noted  . Perirectal abscess 04/26/2017  . 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 Obesity, Class II, BMI 35-39.9 02/27/2017  . OSA (obstructive sleep apnea) 03/13/2015  . Morbid obesity ([place]) 03/13/2015    Imaging: Ct Pelvis W Contrast  Result Date: 04/26/2017 CLINICAL DATA:  History of perirectal abscess concern for recurrence. EXAM: CT PELVIS WITH CONTRAST TECHNIQUE: Multidetector CT imaging of the pelvis was performed using the standard protocol following the bolus administration of intravenous contrast. CONTRAST:  100 cc Isovue-300 IV COMPARISON:  04/20/2017 FINDINGS: Urinary Tract: Visualized distal ureters and urinary bladder unremarkable. Bowel: Normal appendix. Scattered sigmoid diverticula. Visualized small bowel decompressed, unremarkable. Vascular/Lymphatic: No evidence of aneurysm or adenopathy. Reproductive:  Prior hysterectomy.  No adnexal masses. Other: No free fluid or free air. There is a focal fluid collection noted in the left perirectal soft tissues/left perineum that measures 2.1 x 1.7 cm on axial image 117, extending over a craniocaudal length of approximately 4 cm on coronal image 60. Musculoskeletal: No acute bony abnormality. IMPRESSION: Perirectal fluid collection in the left perineum and medial left buttock compatible with perirectal abscess. Sigmoid diverticulosis. Electronically Signed   By: Rolm Baptise M.D.   On: 04/26/2017 11:40    Procedures Dr.Matthew martin (04/26/17) - incision and drainage, rectal exam under anesthesia   Hospital Course:  46 year old female who has been seen in our office several times in the past 2 years with recurrent perianal abscesses who presented to [facility] with perianal pain and swelling.  Workup  showed peri-anal abscess.  Patient was admitted and underwent procedure listed above.  Tolerated procedure well and was transferred to the floor. Packing removed on a POD#1. Patient had no leukocytosis or fevers. On POD#1, the patient was voiding well, tolerating diet, ambulating well, pain well controlled, vital signs stable, incisions c/d/i and felt stable for discharge home.  Patient will follow up in our office as below and knows to call with questions or concerns.   Of note, patient noted to be hyperglycemic during her hospitalization and A1c ordered, as pt has no PCP. She reports trying to get in with a specific [place] provider (Dr. Arbutus Ped ?) but was unable to get an appointment before July 2018. I told patient she may need to see a different provider earlier for recommendations regarding hyperglycemia, lifestyle changes, and possible diabetes mellitus.   Physical Exam: General:  Alert, NAD, pleasant, comfortable Abd:  Soft, non-tender, non-distended GU: left perirectal abscess s/p I&D; 1-2 cm incision noted c/d/i. All 1/4" iodoform packing removed without complication and patient tolerated this well. Surrounding erythema and induration improved compared to previous exam.   Allergies as of 04/27/2017      Reactions   Amoxicillin Rash   Has patient had a PCN reaction causing immediate rash, facial/tongue/throat swelling, SOB or lightheadedness with hypotension: Unknown Has patient had a PCN reaction causing severe rash involving mucus membranes or skin necrosis: Yes Has patient had a PCN reaction that required hospitalization: No Has patient had a PCN reaction occurring within the last 10 years: No If all of the above answers are "NO", then may proceed with Cephalosporin use.   Penicillins Rash   Has patient had a PCN reaction causing immediate rash, facial/tongue/throat swelling, SOB or lightheadedness with hypotension: Unknown Has patient had a PCN reaction causing  severe rash  involving mucus membranes or skin necrosis: Yes Has patient had a PCN reaction that required hospitalization: No Has patient had a PCN reaction occurring within the last 10 years: No If all of the above answers are "NO", then may proceed with Cephalosporin use.   Sulfa Antibiotics Rash   Garlic Diarrhea   Onion Diarrhea      Medication List    TAKE these medications   aspirin-acetaminophen-caffeine 250-250-65 MG tablet Commonly known as:  EXCEDRIN MIGRAINE Take 3 tablets by mouth every 6 (six) hours as needed for headache.   cholecalciferol 1000 units tablet Commonly known as:  VITAMIN D Take 1,000 Units by mouth daily.   ciprofloxacin 500 MG tablet Commonly known as:  CIPRO Take 1 tablet (500 mg total) by mouth 2 (two) times daily for 5 days.   desvenlafaxine 50 MG 24 hr tablet Commonly known as:  PRISTIQ Take 50 mg by mouth daily.   diphenhydramine-acetaminophen 25-500 MG Tabs tablet Commonly known as:  TYLENOL PM Take 2 tablets by mouth at bedtime as needed (for sleep/pain).   fluconazole 200 MG tablet Commonly known as:  DIFLUCAN Take 1 tablet (200 mg total) by mouth daily.   multivitamin with minerals Tabs tablet Take 1 tablet by mouth daily.   oxyCODONE 5 MG immediate release tablet Commonly known as:  Oxy IR/ROXICODONE Take 1 tablet (5 mg total) by mouth every 6 (six) hours as needed for moderate pain, severe pain or breakthrough pain.   VIVELLE-DOT 0.1 MG/24HR patch Generic drug:  estradiol Place 1 patch (0.1 mg total) onto the skin 2 (two) times a week.   VYVANSE 30 MG capsule Generic drug:  lisdexamfetamine Take 30 mg by mouth daily.        Follow-up Information    [facility] [place] Follow up on 05/18/2017.   Specialty:  General Surgery Why:  Your appointment is at 1:30 PM, be at the office 30 minutes early for check in.  Bring photo ID and insurance information.   Contact information: [address] [place]  [address] [phone]           Signed: Obie Dredge, [facility] 04/27/2017, 9:55 AM Pager: [phone] Consults: [phone] Mon-Fri 7:00 am-4:30 pm Sat-Sun 7:00 am-11:30 am

## 2017-04-30 LAB — AEROBIC/ANAEROBIC CULTURE (SURGICAL/DEEP WOUND)

## 2017-04-30 LAB — AEROBIC/ANAEROBIC CULTURE W GRAM STAIN (SURGICAL/DEEP WOUND)

## 2017-05-02 ENCOUNTER — Encounter (HOSPITAL_COMMUNITY): Payer: Self-pay | Admitting: Surgery

## 2017-05-15 DIAGNOSIS — I2510 Atherosclerotic heart disease of native coronary artery without angina pectoris: Secondary | ICD-10-CM | POA: Diagnosis not present

## 2017-05-15 DIAGNOSIS — Z1231 Encounter for screening mammogram for malignant neoplasm of breast: Secondary | ICD-10-CM | POA: Diagnosis not present

## 2017-05-15 DIAGNOSIS — N6310 Unspecified lump in the right breast, unspecified quadrant: Secondary | ICD-10-CM | POA: Diagnosis not present

## 2017-05-15 DIAGNOSIS — C539 Malignant neoplasm of cervix uteri, unspecified: Secondary | ICD-10-CM | POA: Diagnosis not present

## 2017-05-15 DIAGNOSIS — R911 Solitary pulmonary nodule: Secondary | ICD-10-CM | POA: Diagnosis not present

## 2017-05-23 DIAGNOSIS — F331 Major depressive disorder, recurrent, moderate: Secondary | ICD-10-CM | POA: Diagnosis not present

## 2017-05-25 DIAGNOSIS — F5081 Binge eating disorder: Secondary | ICD-10-CM | POA: Diagnosis not present

## 2017-05-30 DIAGNOSIS — M791 Myalgia, unspecified site: Secondary | ICD-10-CM | POA: Diagnosis not present

## 2017-05-30 DIAGNOSIS — G518 Other disorders of facial nerve: Secondary | ICD-10-CM | POA: Diagnosis not present

## 2017-05-30 DIAGNOSIS — M542 Cervicalgia: Secondary | ICD-10-CM | POA: Diagnosis not present

## 2017-05-30 DIAGNOSIS — G43719 Chronic migraine without aura, intractable, without status migrainosus: Secondary | ICD-10-CM | POA: Diagnosis not present

## 2017-06-02 DIAGNOSIS — F4324 Adjustment disorder with disturbance of conduct: Secondary | ICD-10-CM | POA: Diagnosis not present

## 2017-06-15 DIAGNOSIS — F5002 Anorexia nervosa, binge eating/purging type: Secondary | ICD-10-CM | POA: Diagnosis not present

## 2017-06-15 DIAGNOSIS — F331 Major depressive disorder, recurrent, moderate: Secondary | ICD-10-CM | POA: Diagnosis not present

## 2017-06-22 DIAGNOSIS — F5081 Binge eating disorder: Secondary | ICD-10-CM | POA: Diagnosis not present

## 2017-06-29 DIAGNOSIS — F331 Major depressive disorder, recurrent, moderate: Secondary | ICD-10-CM | POA: Diagnosis not present

## 2017-06-30 ENCOUNTER — Ambulatory Visit: Payer: Self-pay | Admitting: General Surgery

## 2017-06-30 ENCOUNTER — Encounter (HOSPITAL_BASED_OUTPATIENT_CLINIC_OR_DEPARTMENT_OTHER): Payer: Self-pay

## 2017-06-30 NOTE — H&P (Signed)
History of Present Illness Alison Ruff MD; 12/21/4164 5:10 PM) The patient is a 47 year old female who presents with a perirectal abscess. She returns to the office with a history of a perirectal abscesses. She had a recurrent left anterior perirectal abscess several weeks ago. This was eventually drained in the OR by Dr. Hassell Done. She has had several abscesses in the past. She has never had an MRI. She did undergo testing in the operating room with Dr. Hassell Done and using hydrogen peroxide and no fistula was found. Since that time she has recovered well. She has no more pain and no more drainage.  Past Medical History:  Diagnosis Date  . Allergy   . Cancer ([place])   . Cervical cancer ([place]) 2015   Past Surgical History:  Procedure Laterality Date  . ABDOMINAL HYSTERECTOMY    . BREAST SURGERY    . HERNIA REPAIR    . INCISION AND DRAINAGE PERIRECTAL ABSCESS N/A 04/26/2017   Procedure: IRRIGATION AND DEBRIDEMENT PERIRECTAL ABSCESS;  Surgeon: Johnathan Hausen, MD;  Location: [facility];  Service: General;  Laterality: N/A;   Family History  Problem Relation Age of Onset  . Cancer Father    Social History   Socioeconomic History  . Marital status: Single    Spouse name: Not on file  . Number of children: Not on file  . Years of education: Not on file  . Highest education level: Not on file  Social Needs  . Financial resource strain: Not on file  . Food insecurity - worry: Not on file  . Food insecurity - inability: Not on file  . Transportation needs - medical: Not on file  . Transportation needs - non-medical: Not on file  Occupational History  . Not on file  Tobacco Use  . Smoking status: Former Smoker    Last attempt to quit: 04/26/1998    Years since quitting: 19.1  . Smokeless tobacco: Never Used  Substance and Sexual Activity  . Alcohol use: No    Alcohol/week: 0.0 oz    Comment: scial  . Drug use: No  . Sexual activity: No  Other Topics Concern  . Not on file   Social History Narrative   ** Merged History Encounter **        Allergies (April Staton, [place]; 05/18/2017 4:47 PM) Amoxicillin *PENICILLINS*  Penicillin G Sodium *PENICILLINS*  Sulfa 10 *OPHTHALMIC AGENTS*  Egg/Pro *DIETARY PRODUCTS/DIETARY MANAGEMENT PRODUCTS*   Medication History (April Staton, CMA; 05/18/2017 4:47 PM) Tylenol (325MG  Tablet, Oral) Active. Pristiq (50MG  Tablet ER 24HR, Oral) Active. Vyvanse (30MG  Capsule, Oral) Active. Divigel (1MG /GM Gel, Transdermal) Active. Vitamin D (Cholecalciferol) (1000UNIT Tablet, Oral) Active. Multiple Vitamin (Oral) Active. Medications Reconciled  Review of Systems - Negative except anal pain  Vitals ( 05/18/2017 4:47 PM Weight: 209.25 lb Height: 64in Body Surface Area: 1.99 m Body Mass Index: 35.92 kg/m  Pulse: 93 (Regular)  BP: 120/88 (Sitting, Left Arm, Standard)       Physical Exam  The physical exam findings are as follows: Note:GENERAL: Well-developed, well nourished female - seems anxious  RESPIRATORY: Normal effort, no use of accessory muscles  MUSCULOSKELETAL: Normal gait Grossly normal ROM upper extremities Grossly normal ROM lower extremities  SKIN: Warm and dry Not diaphoretic  PSYCHIATRIC: Normal judgement and insight Normal mood and affect Alert, oriented x 3  Rectal Note: Prior I&D site on left anterior perianal region is open with no obvious purulent drainage No surrounding erythema/cellulitis, induration, or fluctuance  Assessment & Plan  PERIANAL ABSCESS (K61.0) Impression: 47 year old female who presents to the office after I&D of a perirectal abscess by Dr. Hassell Done in approximately 2 months ago. 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 I have recommended exam under anesthesia to evaluate for fistula in the past but she has not wanted to proceed with this.  She has returned to the office several times to see our urgent office PA.  This week she has  returned with a recurrent abscess.  It is been treated with antibiotics and I&D.  She continues to have pain.  I recommended exam under anesthesia with possible fistulotomy versus seton placement versus I&D.  We have discussed this in detail.  All questions were answered.

## 2017-06-30 NOTE — H&P (View-Only) (Signed)
History of Present Illness Alison Ruff MD; 07/16/3555 5:10 PM) The patient is a 47 year old female who presents with a perirectal abscess. She returns to the office with a history of a perirectal abscesses. She had a recurrent left anterior perirectal abscess several weeks ago. This was eventually drained in the OR by Dr. Hassell Done. She has had several abscesses in the past. She has never had an MRI. She did undergo testing in the operating room with Dr. Hassell Done and using hydrogen peroxide and no fistula was found. Since that time she has recovered well. She has no more pain and no more drainage.  Past Medical History:  Diagnosis Date  . Allergy   . Cancer ([place])   . Cervical cancer ([place]) 2015   Past Surgical History:  Procedure Laterality Date  . ABDOMINAL HYSTERECTOMY    . BREAST SURGERY    . HERNIA REPAIR    . INCISION AND DRAINAGE PERIRECTAL ABSCESS N/A 04/26/2017   Procedure: IRRIGATION AND DEBRIDEMENT PERIRECTAL ABSCESS;  Surgeon: Johnathan Hausen, MD;  Location: [facility];  Service: General;  Laterality: N/A;   Family History  Problem Relation Age of Onset  . Cancer Father    Social History   Socioeconomic History  . Marital status: Single    Spouse name: Not on file  . Number of children: Not on file  . Years of education: Not on file  . Highest education level: Not on file  Social Needs  . Financial resource strain: Not on file  . Food insecurity - worry: Not on file  . Food insecurity - inability: Not on file  . Transportation needs - medical: Not on file  . Transportation needs - non-medical: Not on file  Occupational History  . Not on file  Tobacco Use  . Smoking status: Former Smoker    Last attempt to quit: 04/26/1998    Years since quitting: 19.1  . Smokeless tobacco: Never Used  Substance and Sexual Activity  . Alcohol use: No    Alcohol/week: 0.0 oz    Comment: scial  . Drug use: No  . Sexual activity: No  Other Topics Concern  . Not on file   Social History Narrative   ** Merged History Encounter **        Allergies (April Staton, [place]; 05/18/2017 4:47 PM) Amoxicillin *PENICILLINS*  Penicillin G Sodium *PENICILLINS*  Sulfa 10 *OPHTHALMIC AGENTS*  Egg/Pro *DIETARY PRODUCTS/DIETARY MANAGEMENT PRODUCTS*   Medication History (April Staton, CMA; 05/18/2017 4:47 PM) Tylenol (325MG  Tablet, Oral) Active. Pristiq (50MG  Tablet ER 24HR, Oral) Active. Vyvanse (30MG  Capsule, Oral) Active. Divigel (1MG /GM Gel, Transdermal) Active. Vitamin D (Cholecalciferol) (1000UNIT Tablet, Oral) Active. Multiple Vitamin (Oral) Active. Medications Reconciled  Review of Systems - Negative except anal pain  Vitals ( 05/18/2017 4:47 PM Weight: 209.25 lb Height: 64in Body Surface Area: 1.99 m Body Mass Index: 35.92 kg/m  Pulse: 93 (Regular)  BP: 120/88 (Sitting, Left Arm, Standard)       Physical Exam  The physical exam findings are as follows: Note:GENERAL: Well-developed, well nourished female - seems anxious  RESPIRATORY: Normal effort, no use of accessory muscles  MUSCULOSKELETAL: Normal gait Grossly normal ROM upper extremities Grossly normal ROM lower extremities  SKIN: Warm and dry Not diaphoretic  PSYCHIATRIC: Normal judgement and insight Normal mood and affect Alert, oriented x 3  Rectal Note: Prior I&D site on left anterior perianal region is open with no obvious purulent drainage No surrounding erythema/cellulitis, induration, or fluctuance  Assessment & Plan  PERIANAL ABSCESS (K61.0) Impression: 47 year old female who presents to the office after I&D of a perirectal abscess by Dr. Hassell Done in approximately 2 months ago. 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 I have recommended exam under anesthesia to evaluate for fistula in the past but she has not wanted to proceed with this.  She has returned to the office several times to see our urgent office PA.  This week she has  returned with a recurrent abscess.  It is been treated with antibiotics and I&D.  She continues to have pain.  I recommended exam under anesthesia with possible fistulotomy versus seton placement versus I&D.  We have discussed this in detail.  All questions were answered.

## 2017-07-04 ENCOUNTER — Other Ambulatory Visit: Payer: Self-pay

## 2017-07-04 ENCOUNTER — Encounter (HOSPITAL_BASED_OUTPATIENT_CLINIC_OR_DEPARTMENT_OTHER): Payer: Self-pay

## 2017-07-04 DIAGNOSIS — F900 Attention-deficit hyperactivity disorder, predominantly inattentive type: Secondary | ICD-10-CM | POA: Diagnosis not present

## 2017-07-04 DIAGNOSIS — F331 Major depressive disorder, recurrent, moderate: Secondary | ICD-10-CM | POA: Diagnosis not present

## 2017-07-04 NOTE — Progress Notes (Signed)
Spoke with:  Charlett Nose NPO:  After Midnight, no gum, candy, or mints   Arrival time:  0630AM Labs: Hemoglobin AM medications:  None Pre op orders: Yes  Ride home:  Collie Siad (mother) [phone]

## 2017-07-05 NOTE — Anesthesia Preprocedure Evaluation (Addendum)
Anesthesia Evaluation  Patient identified by MRN, date of birth, ID band Patient awake    Reviewed: Allergy & Precautions, NPO status , Patient's Chart, lab work & pertinent test results  History of Anesthesia Complications Negative for: history of anesthetic complications  Airway Mallampati: IV  TM Distance: <3 FB Neck ROM: Full    Dental  (+) Teeth Intact   Pulmonary sleep apnea and Continuous Positive Airway Pressure Ventilation , former smoker,    breath sounds clear to auscultation       Cardiovascular negative cardio ROS   Rhythm:Regular Rate:Normal     Neuro/Psych  Headaches, Anxiety Depression    GI/Hepatic Neg liver ROS, Perianal abscess, IBS   Endo/Other  Obesity  Renal/GU negative Renal ROS     Musculoskeletal   Abdominal   Peds  Hematology negative hematology ROS (+)   Anesthesia Other Findings Cervical cancer  Reproductive/Obstetrics                             Anesthesia Physical  Anesthesia Plan  ASA: II  Anesthesia Plan: MAC   Post-op Pain Management:    Induction: Intravenous  PONV Risk Score and Plan: Treatment may vary due to age or medical condition and Propofol infusion  Airway Management Planned: Nasal Cannula  Additional Equipment: None  Intra-op Plan:   Post-operative Plan:   Informed Consent: I have reviewed the patients History and Physical, chart, labs and discussed the procedure including the risks, benefits and alternatives for the proposed anesthesia with the patient or authorized representative who has indicated his/her understanding and acceptance.   Dental advisory given  Plan Discussed with: CRNA, Anesthesiologist and Surgeon  Anesthesia Plan Comments:        Anesthesia Quick Evaluation

## 2017-07-06 ENCOUNTER — Ambulatory Visit (HOSPITAL_BASED_OUTPATIENT_CLINIC_OR_DEPARTMENT_OTHER): Payer: BLUE CROSS/BLUE SHIELD | Admitting: Anesthesiology

## 2017-07-06 ENCOUNTER — Ambulatory Visit (HOSPITAL_BASED_OUTPATIENT_CLINIC_OR_DEPARTMENT_OTHER)
Admission: RE | Admit: 2017-07-06 | Discharge: 2017-07-06 | Disposition: A | Discharge status: Home / Self Care | Payer: BLUE CROSS/BLUE SHIELD | Source: Ambulatory Visit | Attending: General Surgery | Admitting: General Surgery

## 2017-07-06 ENCOUNTER — Encounter (HOSPITAL_BASED_OUTPATIENT_CLINIC_OR_DEPARTMENT_OTHER): Payer: Self-pay

## 2017-07-06 ENCOUNTER — Encounter (HOSPITAL_BASED_OUTPATIENT_CLINIC_OR_DEPARTMENT_OTHER)
Admission: RE | Disposition: A | Discharge status: Home / Self Care | Payer: Self-pay | Source: Ambulatory Visit | Attending: General Surgery

## 2017-07-06 DIAGNOSIS — K648 Other hemorrhoids: Secondary | ICD-10-CM | POA: Insufficient documentation

## 2017-07-06 DIAGNOSIS — Z79899 Other long term (current) drug therapy: Secondary | ICD-10-CM | POA: Insufficient documentation

## 2017-07-06 DIAGNOSIS — K610 Anal abscess: Secondary | ICD-10-CM | POA: Diagnosis not present

## 2017-07-06 DIAGNOSIS — Z882 Allergy status to sulfonamides status: Secondary | ICD-10-CM | POA: Insufficient documentation

## 2017-07-06 DIAGNOSIS — E669 Obesity, unspecified: Secondary | ICD-10-CM | POA: Insufficient documentation

## 2017-07-06 DIAGNOSIS — Z9989 Dependence on other enabling machines and devices: Secondary | ICD-10-CM | POA: Diagnosis not present

## 2017-07-06 DIAGNOSIS — F329 Major depressive disorder, single episode, unspecified: Secondary | ICD-10-CM | POA: Insufficient documentation

## 2017-07-06 DIAGNOSIS — K603 Anal fistula: Secondary | ICD-10-CM | POA: Diagnosis not present

## 2017-07-06 DIAGNOSIS — Z6836 Body mass index (BMI) 36.0-36.9, adult: Secondary | ICD-10-CM | POA: Insufficient documentation

## 2017-07-06 DIAGNOSIS — Z880 Allergy status to penicillin: Secondary | ICD-10-CM | POA: Diagnosis not present

## 2017-07-06 DIAGNOSIS — K611 Rectal abscess: Secondary | ICD-10-CM | POA: Diagnosis not present

## 2017-07-06 DIAGNOSIS — G4730 Sleep apnea, unspecified: Secondary | ICD-10-CM | POA: Diagnosis not present

## 2017-07-06 DIAGNOSIS — K644 Residual hemorrhoidal skin tags: Secondary | ICD-10-CM | POA: Diagnosis not present

## 2017-07-06 DIAGNOSIS — Z87891 Personal history of nicotine dependence: Secondary | ICD-10-CM | POA: Insufficient documentation

## 2017-07-06 DIAGNOSIS — G4733 Obstructive sleep apnea (adult) (pediatric): Secondary | ICD-10-CM | POA: Diagnosis not present

## 2017-07-06 DIAGNOSIS — Z91012 Allergy to eggs: Secondary | ICD-10-CM | POA: Insufficient documentation

## 2017-07-06 DIAGNOSIS — Z8541 Personal history of malignant neoplasm of cervix uteri: Secondary | ICD-10-CM | POA: Insufficient documentation

## 2017-07-06 DIAGNOSIS — F419 Anxiety disorder, unspecified: Secondary | ICD-10-CM | POA: Diagnosis not present

## 2017-07-06 DIAGNOSIS — E8940 Asymptomatic postprocedural ovarian failure: Secondary | ICD-10-CM | POA: Diagnosis not present

## 2017-07-06 HISTORY — DX: Irritable bowel syndrome without diarrhea: K58.9

## 2017-07-06 HISTORY — DX: Migraine, unspecified, not intractable, without status migrainosus: G43.909

## 2017-07-06 HISTORY — PX: PLACEMENT OF SETON: SHX6029

## 2017-07-06 HISTORY — DX: Unspecified hemorrhoids: K64.9

## 2017-07-06 HISTORY — DX: Anxiety disorder, unspecified: F41.9

## 2017-07-06 HISTORY — DX: Major depressive disorder, single episode, unspecified: F32.9

## 2017-07-06 HISTORY — PX: EVALUATION UNDER ANESTHESIA WITH ANAL FISTULECTOMY: SHX5621

## 2017-07-06 HISTORY — DX: Other fatigue: R53.83

## 2017-07-06 LAB — POCT HEMOGLOBIN-HEMACUE: Hemoglobin: 13.7 g/dL (ref 12.0–15.0)

## 2017-07-06 SURGERY — EXAM UNDER ANESTHESIA WITH ANAL FISTULECTOMY
Anesthesia: Monitor Anesthesia Care

## 2017-07-06 MED ORDER — OXYCODONE HCL 5 MG PO TABS
5.0000 mg | ORAL_TABLET | Freq: Once | ORAL | Status: AC | PRN
  Administered 2017-07-06: 5 mg via ORAL
  Filled 2017-07-06: qty 1

## 2017-07-06 MED ORDER — ONDANSETRON HCL 4 MG/2ML IJ SOLN
INTRAMUSCULAR | Status: AC
  Filled 2017-07-06: qty 2

## 2017-07-06 MED ORDER — PROPOFOL 500 MG/50ML IV EMUL
INTRAVENOUS | Status: AC
  Filled 2017-07-06: qty 50

## 2017-07-06 MED ORDER — ACETAMINOPHEN 500 MG PO TABS
1000.0000 mg | ORAL_TABLET | ORAL | Status: AC
  Administered 2017-07-06: 1000 mg via ORAL
  Filled 2017-07-06: qty 2

## 2017-07-06 MED ORDER — DEXAMETHASONE SODIUM PHOSPHATE 10 MG/ML IJ SOLN
INTRAMUSCULAR | Status: AC
  Filled 2017-07-06: qty 1

## 2017-07-06 MED ORDER — LACTATED RINGERS IV SOLN
INTRAVENOUS | Status: DC
  Administered 2017-07-06: 07:00:00 via INTRAVENOUS
  Filled 2017-07-06: qty 1000

## 2017-07-06 MED ORDER — MIDAZOLAM HCL 2 MG/2ML IJ SOLN
INTRAMUSCULAR | Status: AC
  Filled 2017-07-06: qty 2

## 2017-07-06 MED ORDER — PROPOFOL 500 MG/50ML IV EMUL
INTRAVENOUS | Status: DC | PRN
  Administered 2017-07-06: 200 ug/kg/min via INTRAVENOUS

## 2017-07-06 MED ORDER — KETAMINE HCL 100 MG/ML IJ SOLN
INTRAMUSCULAR | Status: DC | PRN
  Administered 2017-07-06: 20 ug/kg/min via INTRAVENOUS

## 2017-07-06 MED ORDER — ACETAMINOPHEN 500 MG PO TABS
ORAL_TABLET | ORAL | Status: AC
  Filled 2017-07-06: qty 2

## 2017-07-06 MED ORDER — HYDROCODONE-ACETAMINOPHEN 5-325 MG PO TABS: 1.0000 | ORAL_TABLET | Freq: Four times a day (QID) | ORAL | 0 refills | Status: DC | PRN

## 2017-07-06 MED ORDER — BUPIVACAINE-EPINEPHRINE 0.5% -1:200000 IJ SOLN
INTRAMUSCULAR | Status: DC | PRN
  Administered 2017-07-06: 10 mL

## 2017-07-06 MED ORDER — FENTANYL CITRATE (PF) 100 MCG/2ML IJ SOLN
INTRAMUSCULAR | Status: AC
  Filled 2017-07-06: qty 2

## 2017-07-06 MED ORDER — PROMETHAZINE HCL 25 MG/ML IJ SOLN
6.2500 mg | INTRAMUSCULAR | Status: DC | PRN
  Filled 2017-07-06: qty 1

## 2017-07-06 MED ORDER — MIDAZOLAM HCL 2 MG/2ML IJ SOLN
INTRAMUSCULAR | Status: DC | PRN
  Administered 2017-07-06: 2 mg via INTRAVENOUS

## 2017-07-06 MED ORDER — OXYCODONE HCL 5 MG/5ML PO SOLN
5.0000 mg | Freq: Once | ORAL | Status: AC | PRN
  Filled 2017-07-06: qty 5

## 2017-07-06 MED ORDER — FENTANYL CITRATE (PF) 100 MCG/2ML IJ SOLN
25.0000 ug | INTRAMUSCULAR | Status: DC | PRN
  Administered 2017-07-06 (×2): 25 ug via INTRAVENOUS
  Filled 2017-07-06: qty 1

## 2017-07-06 MED ORDER — LIDOCAINE 2% (20 MG/ML) 5 ML SYRINGE
INTRAMUSCULAR | Status: AC
  Filled 2017-07-06: qty 5

## 2017-07-06 MED ORDER — KETAMINE HCL 10 MG/ML IJ SOLN
INTRAMUSCULAR | Status: DC | PRN
  Administered 2017-07-06: 20 mg via INTRAVENOUS

## 2017-07-06 MED ORDER — LIDOCAINE 2% (20 MG/ML) 5 ML SYRINGE
INTRAMUSCULAR | Status: DC | PRN
  Administered 2017-07-06: 50 mg via INTRAVENOUS

## 2017-07-06 MED ORDER — OXYCODONE HCL 5 MG PO TABS
ORAL_TABLET | ORAL | Status: AC
  Filled 2017-07-06: qty 1

## 2017-07-06 SURGICAL SUPPLY — 50 items
BENZOIN TINCTURE PRP APPL 2/3 (GAUZE/BANDAGES/DRESSINGS) ×2 IMPLANT
BLADE EXTENDED COATED 6.5IN (ELECTRODE) IMPLANT
BLADE HEX COATED 2.75 (ELECTRODE) ×2 IMPLANT
BLADE SURG 10 STRL SS (BLADE) IMPLANT
BLADE SURG 15 STRL LF DISP TIS (BLADE) ×1 IMPLANT
BLADE SURG 15 STRL SS (BLADE) ×1
BRIEF STRETCH FOR OB PAD LRG (UNDERPADS AND DIAPERS) ×2 IMPLANT
CANISTER SUCT 3000ML PPV (MISCELLANEOUS) ×2 IMPLANT
COVER BACK TABLE 60X90IN (DRAPES) ×2 IMPLANT
COVER MAYO STAND STRL (DRAPES) ×2 IMPLANT
DECANTER SPIKE VIAL GLASS SM (MISCELLANEOUS) IMPLANT
DRAPE LAPAROTOMY 100X72 PEDS (DRAPES) ×2 IMPLANT
DRAPE UTILITY XL STRL (DRAPES) ×2 IMPLANT
ELECT REM PT RETURN 9FT ADLT (ELECTROSURGICAL) ×2
ELECTRODE REM PT RTRN 9FT ADLT (ELECTROSURGICAL) ×1 IMPLANT
GAUZE SPONGE 4X4 12PLY STRL (GAUZE/BANDAGES/DRESSINGS) ×2 IMPLANT
GAUZE SPONGE 4X4 16PLY XRAY LF (GAUZE/BANDAGES/DRESSINGS) ×2 IMPLANT
GLOVE BIO SURGEON STRL SZ 6.5 (GLOVE) ×2 IMPLANT
GLOVE BIOGEL PI IND STRL 7.0 (GLOVE) ×1 IMPLANT
GLOVE BIOGEL PI INDICATOR 7.0 (GLOVE) ×1
GLOVE INDICATOR 7.0 STRL GRN (GLOVE) ×2 IMPLANT
GOWN SPEC L3 XXLG W/TWL (GOWN DISPOSABLE) ×2 IMPLANT
GOWN STRL REUS W/TWL 2XL LVL3 (GOWN DISPOSABLE) ×2 IMPLANT
HYDROGEN PEROXIDE 16OZ (MISCELLANEOUS) ×2 IMPLANT
KIT RM TURNOVER CYSTO AR (KITS) ×2 IMPLANT
LOOP VESSEL MAXI BLUE (MISCELLANEOUS) ×2 IMPLANT
NEEDLE HYPO 22GX1.5 SAFETY (NEEDLE) ×2 IMPLANT
NS IRRIG 500ML POUR BTL (IV SOLUTION) ×2 IMPLANT
PACK BASIN DAY SURGERY FS (CUSTOM PROCEDURE TRAY) ×2 IMPLANT
PAD ABD 8X10 STRL (GAUZE/BANDAGES/DRESSINGS) ×2 IMPLANT
PAD ARMBOARD 7.5X6 YLW CONV (MISCELLANEOUS) ×2 IMPLANT
PENCIL BUTTON HOLSTER BLD 10FT (ELECTRODE) ×2 IMPLANT
SPONGE GAUZE 4X4 12PLY STER LF (GAUZE/BANDAGES/DRESSINGS) IMPLANT
SPONGE HEMORRHOID 8X3CM (HEMOSTASIS) IMPLANT
SPONGE SURGIFOAM ABS GEL 12-7 (HEMOSTASIS) IMPLANT
SUCTION FRAZIER HANDLE 10FR (MISCELLANEOUS)
SUCTION TUBE FRAZIER 10FR DISP (MISCELLANEOUS) IMPLANT
SUT CHROMIC 2 0 SH (SUTURE) IMPLANT
SUT CHROMIC 3 0 SH 27 (SUTURE) IMPLANT
SUT ETHIBOND 0 (SUTURE) ×2 IMPLANT
SUT VIC AB 2-0 SH 27 (SUTURE)
SUT VIC AB 2-0 SH 27XBRD (SUTURE) IMPLANT
SUT VIC AB 3-0 SH 18 (SUTURE) ×2 IMPLANT
SUT VIC AB 4-0 P-3 18XBRD (SUTURE) IMPLANT
SUT VIC AB 4-0 P3 18 (SUTURE)
SYR CONTROL 10ML LL (SYRINGE) ×2 IMPLANT
TOWEL OR 17X24 6PK STRL BLUE (TOWEL DISPOSABLE) ×2 IMPLANT
TRAY DSU PREP LF (CUSTOM PROCEDURE TRAY) ×2 IMPLANT
TUBE CONNECTING 12X1/4 (SUCTIONS) ×2 IMPLANT
YANKAUER SUCT BULB TIP NO VENT (SUCTIONS) ×2 IMPLANT

## 2017-07-06 NOTE — Discharge Instructions (Addendum)

## 2017-07-06 NOTE — Interval H&P Note (Signed)
History and Physical Interval Note:  07/06/2017 7:28 AM  [name]  has presented today for surgery, with the diagnosis of recurrent anal abscess  The various methods of treatment have been discussed with the patient and family. After consideration of risks, benefits and other options for treatment, the patient has consented to  Procedure(s): ANAL EXAM UNDER ANESTHESIA (N/A) INCISION AND DRAINAGE (N/A) PLACEMENT OF SETON VS. FISTULOTOMY (N/A) as a surgical intervention .  The patient's history has been reviewed, patient examined, no change in status, stable for surgery.  I have reviewed the patient's chart and labs.  Questions were answered to the patient's satisfaction.     Rosario Adie, MD  [facility]

## 2017-07-06 NOTE — Anesthesia Procedure Notes (Signed)
Procedure Name: MAC Date/Time: 07/06/2017 8:47 AM Performed by: Wanita Chamberlain, CRNA Pre-anesthesia Checklist: Patient identified, Emergency Drugs available, Suction available, Patient being monitored and Timeout performed Patient Re-evaluated:Patient Re-evaluated prior to induction Oxygen Delivery Method: Nasal cannula Induction Type: IV induction Placement Confirmation: breath sounds checked- equal and bilateral,  CO2 detector and positive ETCO2 Dental Injury: Teeth and Oropharynx as per pre-operative assessment

## 2017-07-06 NOTE — Transfer of Care (Signed)
Immediate Anesthesia Transfer of Care Note  Patient: [name]  Procedure(s) Performed: ANAL EXAM UNDER ANESTHESIA (N/A ) PLACEMENT OF SETON (N/A )  Patient Location: PACU  Anesthesia Type:MAC  Level of Consciousness: awake, alert , oriented and patient cooperative  Airway & Oxygen Therapy: Patient Spontanous Breathing and Patient connected to nasal cannula oxygen  Post-op Assessment: Report given to RN and Post -op Vital signs reviewed and stable  Post vital signs: Reviewed and stable  Last Vitals:  Vitals:   07/06/17 0646  BP: (!) 147/84  Pulse: 88  Resp: 18  Temp: 36.4 C  SpO2: 98%    Last Pain:  Vitals:   07/06/17 0709  TempSrc:   PainSc: 2       Patients Stated Pain Goal: 7 (89/37/34 2876)  Complications: No apparent anesthesia complications

## 2017-07-06 NOTE — Anesthesia Postprocedure Evaluation (Signed)
Anesthesia Post Note  Patient: [name]  Procedure(s) Performed: ANAL EXAM UNDER ANESTHESIA (N/A ) PLACEMENT OF SETON (N/A )     Patient location during evaluation: PACU Anesthesia Type: MAC Level of consciousness: awake and alert Pain management: pain level controlled Vital Signs Assessment: post-procedure vital signs reviewed and stable Respiratory status: spontaneous breathing, nonlabored ventilation and respiratory function stable Cardiovascular status: stable and blood pressure returned to baseline Anesthetic complications: no    Last Vitals:  Vitals:   07/06/17 0917 07/06/17 0930  BP: 137/80 135/75  Pulse: (!) 103 95  Resp: (!) 24 20  Temp:    SpO2: 95% 95%    Last Pain:  Vitals:   07/06/17 0930  TempSrc:   PainSc: Hartley Taeveon Keesling

## 2017-07-06 NOTE — Op Note (Addendum)
07/06/2017  8:52 AM  PATIENT:  [name]  47 y.o. female  Patient Care Team: Patient, No Pcp Per as PCP - General (General Practice)  PRE-OPERATIVE DIAGNOSIS:  recurrent anal abscess  POST-OPERATIVE DIAGNOSIS:  Trans-sphincteric anal fistula  PROCEDURE:  ANAL EXAM UNDER ANESTHESIA PLACEMENT OF SETON   Surgeon(s): Leighton Ruff, MD  ASSISTANT: none   ANESTHESIA:   local and MAC  SPECIMEN:  No Specimen  DISPOSITION OF SPECIMEN:  N/A  COUNTS:  YES  PLAN OF CARE: Discharge to home after PACU  PATIENT DISPOSITION:  PACU - hemodynamically stable.  INDICATION: 47 year old female with recurrent perianal abscesses.   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  MAC anesthesia was induced without difficulty. The patient was then positioned in prone jackknife position with buttocks gently taped apart.  The patient was then prepped and draped in usual sterile fashion.  SCDs were noted to be in place prior to the initiation of anesthesia. A surgical timeout was performed indicating the correct patient, procedure, positioning and need for preoperative antibiotics.  A rectal block was performed using Marcaine with epinephrine.    I began with a digital rectal exam.  The patient had 2 rather large external hemorrhoids.  I then placed a Hill-Ferguson anoscope into the anal canal and evaluated this completely.  There was minimal internal hemorrhoid disease.  The patient is an obvious anterior midline internal opening.  I cut down on the external opening using Bovie electrocautery.  I identified the abscess cavity which was cleaned.  I then placed an S shaped fistula probe into the anterior midline internal opening and this entered into the abscess cavity without difficulty.   An Ethibond suture was attached to the fistula probe and this was pulled back through the fistula.  I then used this to pull a vessel loop through the fistula tract and that was secured into a loop with Ethibond sutures.  Hemostasis was achieved using electrocautery.  A sterile dressing was applied.  The patient was awakened from anesthesia and sent to the postanesthesia care unit in stable condition.  All counts were correct per operating room staff.  I have reviewed the [employer]

## 2017-07-07 ENCOUNTER — Encounter (HOSPITAL_BASED_OUTPATIENT_CLINIC_OR_DEPARTMENT_OTHER): Payer: Self-pay | Admitting: General Surgery

## 2017-07-20 DIAGNOSIS — G4733 Obstructive sleep apnea (adult) (pediatric): Secondary | ICD-10-CM | POA: Diagnosis not present

## 2017-07-20 DIAGNOSIS — G4710 Hypersomnia, unspecified: Secondary | ICD-10-CM | POA: Diagnosis not present

## 2017-07-27 DIAGNOSIS — F4310 Post-traumatic stress disorder, unspecified: Secondary | ICD-10-CM | POA: Diagnosis not present

## 2017-07-27 DIAGNOSIS — F331 Major depressive disorder, recurrent, moderate: Secondary | ICD-10-CM | POA: Diagnosis not present

## 2017-08-01 DIAGNOSIS — F900 Attention-deficit hyperactivity disorder, predominantly inattentive type: Secondary | ICD-10-CM | POA: Diagnosis not present

## 2017-08-01 DIAGNOSIS — F5002 Anorexia nervosa, binge eating/purging type: Secondary | ICD-10-CM | POA: Diagnosis not present

## 2017-08-01 DIAGNOSIS — F331 Major depressive disorder, recurrent, moderate: Secondary | ICD-10-CM | POA: Diagnosis not present

## 2017-08-02 ENCOUNTER — Other Ambulatory Visit: Payer: Self-pay

## 2017-08-02 ENCOUNTER — Ambulatory Visit (INDEPENDENT_AMBULATORY_CARE_PROVIDER_SITE_OTHER): Payer: BLUE CROSS/BLUE SHIELD

## 2017-08-02 ENCOUNTER — Encounter (HOSPITAL_COMMUNITY): Payer: Self-pay | Admitting: Emergency Medicine

## 2017-08-02 ENCOUNTER — Ambulatory Visit (HOSPITAL_COMMUNITY)
Admission: EM | Admit: 2017-08-02 | Discharge: 2017-08-02 | Disposition: A | Discharge status: Home / Self Care | Payer: BLUE CROSS/BLUE SHIELD | Attending: Family Medicine | Admitting: Family Medicine

## 2017-08-02 DIAGNOSIS — S52571A Other intraarticular fracture of lower end of right radius, initial encounter for closed fracture: Secondary | ICD-10-CM | POA: Diagnosis not present

## 2017-08-02 DIAGNOSIS — W19XXXA Unspecified fall, initial encounter: Secondary | ICD-10-CM | POA: Diagnosis not present

## 2017-08-02 DIAGNOSIS — Y9367 Activity, basketball: Secondary | ICD-10-CM | POA: Diagnosis not present

## 2017-08-02 DIAGNOSIS — S52514A Nondisplaced fracture of right radial styloid process, initial encounter for closed fracture: Secondary | ICD-10-CM | POA: Diagnosis not present

## 2017-08-02 MED ORDER — IBUPROFEN 800 MG PO TABS
ORAL_TABLET | ORAL | Status: AC
  Filled 2017-08-02: qty 1

## 2017-08-02 MED ORDER — IBUPROFEN 800 MG PO TABS
800.0000 mg | ORAL_TABLET | Freq: Once | ORAL | Status: AC
  Administered 2017-08-02: 800 mg via ORAL

## 2017-08-02 MED ORDER — MELOXICAM 7.5 MG PO TABS: 7.5000 mg | ORAL_TABLET | Freq: Every day | ORAL | 0 refills | Status: DC

## 2017-08-02 NOTE — ED Notes (Signed)
Ortho Tech in w/ pt applying splint to right arm.  Pt is aware she may leave when he is done.  AVS was given and explained.  Pt stated understanding.

## 2017-08-02 NOTE — Discharge Instructions (Signed)
Splint applied. Start mobic as directed. Ice compress for swelling. Follow up with orthopedics for further evaluation needed.

## 2017-08-02 NOTE — ED Notes (Signed)
Ortho Tech returned page.  He is in a trauma and then has one more pt ahead of this pt but will be here ASAP.

## 2017-08-02 NOTE — Progress Notes (Signed)
Orthopedic Tech Progress Note Patient Details:  [name] 11/05/1970 [id number]  Ortho Devices Type of Ortho Device: Ace wrap, Sugartong splint Ortho Device/Splint Interventions: Application   Post Interventions Patient Tolerated: Well Instructions Provided: Care of device   Hildred Priest 08/02/2017, 2:48 PM

## 2017-08-02 NOTE — ED Notes (Signed)
Ortho Tech paged.

## 2017-08-02 NOTE — ED Provider Notes (Signed)
[place]    CSN: [id number] Arrival date & time: 08/02/17  1100     History   Chief Complaint Chief Complaint  Patient presents with  . Wrist Pain    HPI [name] is a 47 y.o. female.   47 year old female comes in for 2-day history of right wrist pain after injury.  States she was playing basketball yesterday, slipped and fell to the side.  She is unsure how she landed on the wrist, but thinks wrist was flexed.  She was not able to move her wrist immediately after accident, with mild swelling to the area.  She taken Tylenol and ibuprofen with little relief.  Has been applying ice compress with good control of the swelling.  Denies numbness, tingling.      Past Medical History:  Diagnosis Date  . Allergy   . Anxiety   . Cervical cancer ([place]) 2015  . Depression   . Fatigue   . Hemorrhoids   . History of UTI   . IBS (irritable bowel syndrome)   . Incisional hernia   . Migraines   . Morbid obesity ([place])   . OSA (obstructive sleep apnea)    uses CPAP nightly  . Perirectal abscess     Patient Active Problem List   Diagnosis Date Noted  . Perirectal abscess 04/26/2017  . 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 Obesity, Class II, BMI 35-39.9 02/27/2017  . OSA (obstructive sleep apnea) 03/13/2015  . Morbid obesity ([place]) 03/13/2015    Past Surgical History:  Procedure Laterality Date  . BILATERAL SALPINGOOPHORECTOMY  2015  . BREAST SURGERY Right    2 benign lumps  . COLONOSCOPY    . EVALUATION UNDER ANESTHESIA WITH ANAL FISTULECTOMY N/A 07/06/2017   Procedure: ANAL EXAM UNDER ANESTHESIA;  Surgeon: Leighton Ruff, MD;  Location: [place];  Service: General;  Laterality: N/A;  . HERNIA REPAIR    . INCISION AND DRAINAGE PERIRECTAL ABSCESS N/A 04/26/2017   Procedure: IRRIGATION AND DEBRIDEMENT PERIRECTAL ABSCESS;  Surgeon: Johnathan Hausen, MD;  Location[facility];  Service: General;  Laterality: N/A;  . [place] N/A  07/06/2017   Procedure: PLACEMENT OF SETON;  Surgeon: Leighton Ruff, MD;  Location: [place];  Service: General;  Laterality: N/A;  . TOTAL ABDOMINAL HYSTERECTOMY    . WISDOM TOOTH EXTRACTION      OB History    Gravida Para Term Preterm AB Living   0 0 0 0 0     SAB TAB Ectopic Multiple Live Births   0 0 0           Home Medications    Prior to Admission medications   Medication Sig Start Date End Date Taking? Authorizing Provider  aspirin-acetaminophen-caffeine (EXCEDRIN MIGRAINE) [phone] MG tablet Take 3 tablets by mouth every 6 (six) hours as needed for headache.    [provider]  cholecalciferol (VITAMIN D) 1000 UNITS tablet Take 1,000 Units by mouth daily.    [provider]  desvenlafaxine (PRISTIQ) 50 MG 24 hr tablet Take 50 mg by mouth daily. 03/28/17   [provider]  diphenhydramine-acetaminophen (TYLENOL PM) 25-500 MG TABS tablet Take 2 tablets by mouth at bedtime as needed (for sleep/pain).    [provider]  HYDROcodone-acetaminophen (NORCO/VICODIN) 5-325 MG tablet Take 1-2 tablets by mouth every 6 (six) hours as needed. 4/78/29   Leighton Ruff, MD  meloxicam (MOBIC) 7.5 MG tablet Take 1 tablet (7.5 mg total) by mouth daily.  08/02/17   Tasia Catchings, Amy V, PA-C  Multiple Vitamin (MULTIVITAMIN WITH MINERALS) TABS tablet Take 1 tablet by mouth daily.    [provider]  VIVELLE-DOT 0.1 MG/24HR patch Place 1 patch (0.1 mg total) onto the skin 2 (two) times a week. 02/27/17   Philemon Kingdom, MD  VYVANSE 30 MG capsule Take 30 mg by mouth daily. 03/28/17   [provider]    Family History Family History  Problem Relation Age of Onset  . Cancer Father     Social History Social History   Tobacco Use  . Smoking status: Former Smoker    Last attempt to quit: 04/26/1998    Years since quitting: 19.2  . Smokeless tobacco: Never Used  Substance Use Topics  . Alcohol use: No    Alcohol/week: 0.0 oz      Comment: social  . Drug use: No     Allergies   Amoxicillin; Penicillins; Sulfa antibiotics; Garlic; and Onion   Review of Systems Review of Systems  Reason unable to perform ROS: See HPI as above.     Physical Exam Triage Vital Signs ED Triage Vitals  Enc Vitals Group     BP 08/02/17 1215 (!) 151/106     Pulse Rate 08/02/17 1215 78     Resp 08/02/17 1215 20     Temp 08/02/17 1215 (!) 97.1 F (36.2 C)     Temp Source 08/02/17 1215 Oral     SpO2 08/02/17 1215 96 %     Weight --      Height --      Head Circumference --      Peak Flow --      Pain Score 08/02/17 1220 6     Pain Loc --      Pain Edu? --      Excl. in [place]? --    No data found.  Updated Vital Signs BP (!) 151/106 (BP Location: Left Arm) Comment: Notified Kim  Pulse 78   Temp (!) 97.1 F (36.2 C) (Oral)   Resp 20   SpO2 96%   Physical Exam  Constitutional: She is oriented to person, place, and time. She appears well-developed and well-nourished. No distress.  HENT:  Head: Normocephalic and atraumatic.  Eyes: Conjunctivae are normal. Pupils are equal, round, and reactive to light.  Musculoskeletal:  Mild swelling around the wrist. No contusion, erythema, increased warmth.  Tenderness to palpation of radial aspect of the wrist.  No tenderness to palpation of MCPs.  No tenderness to palpation along radial forearm.  Mild diffuse tenderness to palpation of ulnar forearm.  Patient deferred range of motion of the wrist, stating pain.  Decreased range of motion of fingers, unable to make fist.  Strength deferred.  Sensation intact and equal bilaterally.  Radial pulse 2+ and equal bilaterally.  Cap refill less than 2 seconds.  Neurological: She is alert and oriented to person, place, and time.     UC Treatments / Results  Labs (all labs ordered are listed, but only abnormal results are displayed) Labs Reviewed - No data to display  EKG  EKG Interpretation None       Radiology Dg Wrist Complete  Right  Result Date: 08/02/2017 CLINICAL DATA:  47 year old female status post fall playing basketball yesterday with wrist pain, 1st metacarpal and scaphoid area dorsal pain. EXAM: RIGHT WRIST - COMPLETE 3+ VIEW COMPARISON:  None. FINDINGS: Mildly comminuted but nondisplaced fracture of the right radial styloid with radiocarpal joint involvement (  image 3). No DRU involvement. Mild dorsal impaction and volar angulation. The distal ulna appears intact. The scaphoid and other carpal bones appear intact and normally aligned. Visible metacarpals are intact. IMPRESSION: Mildly comminuted, minimally displaced/impacted fracture of the right radial styloid with radiocarpal joint involvement. Electronically Signed   By: Genevie Ann M.D.   On: 08/02/2017 13:12    Procedures Procedures (including critical care time)  Medications Ordered in UC Medications  ibuprofen (ADVIL,MOTRIN) tablet 800 mg (800 mg Oral Given 08/02/17 1351)     Initial Impression / Assessment and Plan / UC Course  I have reviewed the triage vital signs and the nursing notes.  Pertinent labs & imaging results that were available during my care of the patient were reviewed by me and considered in my medical decision making (see chart for details).    Sugar tong splint applied.  Mobic as directed for pain and inflammation.  Ice compress.  Patient declined narcotics for further pain relief.  She will take Tylenol for any breakthrough pain.  Follow-up with orthopedics for further evaluation and management needed.  Patient expresses understanding and agrees to plan.  Final Clinical Impressions(s) / UC Diagnoses   Final diagnoses:  Other closed intra-articular fracture of distal end of right radius, initial encounter    ED Discharge Orders        Ordered    meloxicam (MOBIC) 7.5 MG tablet  Daily     08/02/17 1352        Ok Edwards, PA-C 08/02/17 1502

## 2017-08-02 NOTE — ED Triage Notes (Signed)
States was playing basketball yesterday and fell on right hand, c/o wrist pain

## 2017-08-07 DIAGNOSIS — S52501A Unspecified fracture of the lower end of right radius, initial encounter for closed fracture: Secondary | ICD-10-CM | POA: Diagnosis not present

## 2017-08-10 DIAGNOSIS — F5081 Binge eating disorder: Secondary | ICD-10-CM | POA: Diagnosis not present

## 2017-08-10 DIAGNOSIS — F331 Major depressive disorder, recurrent, moderate: Secondary | ICD-10-CM | POA: Diagnosis not present

## 2017-08-17 DIAGNOSIS — F331 Major depressive disorder, recurrent, moderate: Secondary | ICD-10-CM | POA: Diagnosis not present

## 2017-08-17 DIAGNOSIS — F4310 Post-traumatic stress disorder, unspecified: Secondary | ICD-10-CM | POA: Diagnosis not present

## 2017-08-17 DIAGNOSIS — S52501D Unspecified fracture of the lower end of right radius, subsequent encounter for closed fracture with routine healing: Secondary | ICD-10-CM | POA: Diagnosis not present

## 2017-08-18 DIAGNOSIS — G43719 Chronic migraine without aura, intractable, without status migrainosus: Secondary | ICD-10-CM | POA: Diagnosis not present

## 2017-08-23 DIAGNOSIS — M542 Cervicalgia: Secondary | ICD-10-CM | POA: Diagnosis not present

## 2017-08-23 DIAGNOSIS — G43719 Chronic migraine without aura, intractable, without status migrainosus: Secondary | ICD-10-CM | POA: Diagnosis not present

## 2017-08-23 DIAGNOSIS — G518 Other disorders of facial nerve: Secondary | ICD-10-CM | POA: Diagnosis not present

## 2017-08-23 DIAGNOSIS — M791 Myalgia, unspecified site: Secondary | ICD-10-CM | POA: Diagnosis not present

## 2017-08-24 DIAGNOSIS — F331 Major depressive disorder, recurrent, moderate: Secondary | ICD-10-CM | POA: Diagnosis not present

## 2017-08-28 ENCOUNTER — Encounter: Payer: Self-pay | Admitting: Internal Medicine

## 2017-08-28 ENCOUNTER — Ambulatory Visit (INDEPENDENT_AMBULATORY_CARE_PROVIDER_SITE_OTHER): Payer: BLUE CROSS/BLUE SHIELD | Admitting: Internal Medicine

## 2017-08-28 VITALS — BP 112/74 | HR 93 | Ht 64.5 in | Wt 218.0 lb

## 2017-08-28 DIAGNOSIS — E559 Vitamin D deficiency, unspecified: Secondary | ICD-10-CM | POA: Diagnosis not present

## 2017-08-28 DIAGNOSIS — E8940 Asymptomatic postprocedural ovarian failure: Secondary | ICD-10-CM

## 2017-08-28 DIAGNOSIS — S52501D Unspecified fracture of the lower end of right radius, subsequent encounter for closed fracture with routine healing: Secondary | ICD-10-CM | POA: Diagnosis not present

## 2017-08-28 DIAGNOSIS — E669 Obesity, unspecified: Secondary | ICD-10-CM

## 2017-08-28 MED ORDER — ESTRADIOL 0.1 MG/24HR TD PTTW: 1.0000 | MEDICATED_PATCH | TRANSDERMAL | 12 refills | Status: DC

## 2017-08-28 NOTE — Progress Notes (Signed)
Patient ID: [name], female   DOB: 1970-11-18, 47 y.o.   MRN: [id number]   HPI: [name] is a 47 y.o. 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  Last visit 6 months ago.  She was recently dx'ed with IBD. 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  Since last visit, she had a radial fracture 07/2017.  This has healed.  Still painful.  Of note, she does not have history of osteoporosis.  Reviewed latest 04/22/2016: LUMBAR SPINE .BMD measured in L1-L4 region of interest is 1.125 g/cm2. .T-score: - 0.6 .Z-score: - 0.5 .Comments: None.  FEMORAL NECK .BMD measured in left femoral neck region of interest is 1.016 g/cm2. .T-score: - 0.2 .Z-score: 0.5 .Comments: None.  TOTAL HIP .BMD measured in left total hip region of interest is 1.098 g/cm2. .T-score: 0.7 .Z-score: 1.0 .Comments: None.  Reviewed and addended history: Pt has a h/o cervical cancer, TAH/BSO in 2015. She developed postsurgical premature menopause at 46 years old  and she was started on hormone replacement therapy. She was previously seen by Dr. Barry Dienes at [facility], however, she wanted to see somebody in [place] as Dr. Kingsley Callander left practice. I reviewed Dr Lenox Ponds notes in [place].  She was initially treated with Climara patches >>  she could not use the patches as awould not stay put on her skin >> then on Divigel (Estradiol gel) 1 mg/day. She was not happy with this product due to the fact  that she needed s to apply it on a larger area of skin, on her lower back/buttocks area, but she was sweating and she felt that she may transfer her estrogen to her daughter, who sleeps with her.  She did admit that she did not have hot flashes while on this.  At last visit, in 02/2017, we changed from [place] to Vivelle-Dot patches to be changed twice a week.  She did well with this and would like to continue.  This is,  however, expensive.  She also have anxiety/depression and is under a lot of stress being a single mom and also caring for her elderly mother. She would be interested in starting a medication for depression/anxiety. She is seeing a Social worker and was recommended to see a psychiatrist.   She was started on Vyvanse and Pristiq.  Other problems: Obesity, chronic migraines (neurology), IBS, colitis (sees Dr. Penelope Coop [facility]).  At last visit, we discussed at length about diet and I suggested up whole food plant-based diet.  Given references.  She did not lose weight by her scale at home and by our scale here, but she actually dropped several dress sizes and also feels much lighter.   ROS: Constitutional: no weight gain/no weight loss, no fatigue, + subjective hyperthermia (but not hot flashes), no subjective hypothermia Eyes: no blurry vision, no xerophthalmia ENT: no sore throat, no nodules palpated in throat, no dysphagia, no odynophagia, no hoarseness Cardiovascular: no CP/no SOB/no palpitations/no leg swelling Respiratory: no cough/no SOB/no wheezing Gastrointestinal: no N/no V+ D/no C/no acid reflux Musculoskeletal: no muscle aches/no joint aches Skin: no rashes, no hair loss Neurological: no tremors/no numbness/no tingling/no dizziness  I reviewed pt's medications, allergies, PMH, social hx, family hx, and changes were documented in the history of present illness. Otherwise, unchanged from my initial visit note.  Past Medical History:  Diagnosis Date  . Allergy   . Anxiety   . Cervical cancer ([place]) 2015  . Depression   .  Fatigue   . Hemorrhoids   . History of UTI   . IBS (irritable bowel syndrome)   . Incisional hernia   . Migraines   . Morbid obesity ([place])   . OSA (obstructive sleep apnea)    uses CPAP nightly  . Perirectal abscess    Past Surgical History:  Procedure Laterality Date  . BILATERAL SALPINGOOPHORECTOMY  2015  . BREAST SURGERY Right    2 benign lumps  .  COLONOSCOPY    . EVALUATION UNDER ANESTHESIA WITH ANAL FISTULECTOMY N/A 07/06/2017   Procedure: ANAL EXAM UNDER ANESTHESIA;  Surgeon: Leighton Ruff, MD;  Location: [place];  Service: General;  Laterality: N/A;  . HERNIA REPAIR    . INCISION AND DRAINAGE PERIRECTAL ABSCESS N/A 04/26/2017   Procedure: IRRIGATION AND DEBRIDEMENT PERIRECTAL ABSCESS;  Surgeon: Johnathan Hausen, MD;  [facility];  Service: General;  Laterality: N/A;  . [place] N/A 07/06/2017   Procedure: PLACEMENT OF SETON;  Surgeon: Leighton Ruff, MD;  Location: [place];  Service: General;  Laterality: N/A;  . TOTAL ABDOMINAL HYSTERECTOMY    . WISDOM TOOTH EXTRACTION     Social History   Social History  . Marital status: Single    Spouse name: N/A  . Number of children: 1   Occupational History  . 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 Smoking status: Former Smoker    Quit date: 04/26/1998  . Smokeless tobacco: Never Used  . Alcohol use No       . Drug use: No   Current Outpatient Medications on File Prior to Visit  Medication Sig Dispense Refill  . aspirin-acetaminophen-caffeine (EXCEDRIN MIGRAINE) 250-250-65 MG tablet Take 3 tablets by mouth every 6 (six) hours as needed for headache.    . cholecalciferol (VITAMIN D) 1000 UNITS tablet Take 1,000 Units by mouth daily.    Marland Kitchen desvenlafaxine (PRISTIQ) 50 MG 24 hr tablet Take 50 mg by mouth daily.  1  . diphenhydramine-acetaminophen (TYLENOL PM) 25-500 MG TABS tablet Take 2 tablets by mouth at bedtime as needed (for sleep/pain).    Marland Kitchen HYDROcodone-acetaminophen (NORCO/VICODIN) 5-325 MG tablet Take 1-2 tablets by mouth every 6 (six) hours as needed. 20 tablet 0  . meloxicam (MOBIC) 7.5 MG tablet Take 1 tablet (7.5 mg total) by mouth daily. 15 tablet 0  . Multiple Vitamin (MULTIVITAMIN WITH MINERALS) TABS tablet Take 1 tablet by mouth daily.    Marland Kitchen VIVELLE-DOT 0.1 MG/24HR patch Place 1 patch  (0.1 mg total) onto the skin 2 (two) times a week. 8 patch 12  . VYVANSE 30 MG capsule Take 30 mg by mouth daily.  0   No current facility-administered medications on file prior to visit.    Allergies  Allergen Reactions  . Amoxicillin Rash    Has patient had a PCN reaction causing immediate rash, facial/tongue/throat swelling, SOB or lightheadedness with hypotension: Unknown Has patient had a PCN reaction causing severe rash involving mucus membranes or skin necrosis: Yes Has patient had a PCN reaction that required hospitalization: No Has patient had a PCN reaction occurring within the last 10 years: No If all of the above answers are "NO", then may proceed with Cephalosporin use.   Marland Kitchen Penicillins Rash    Has patient had a PCN reaction causing immediate rash, facial/tongue/throat swelling, SOB or lightheadedness with hypotension: Unknown Has patient had a PCN reaction causing severe rash involving mucus membranes or skin necrosis: Yes Has  patient had a PCN reaction that required hospitalization: No Has patient had a PCN reaction occurring within the last 10 years: No If all of the above answers are "NO", then may proceed with Cephalosporin use.   . Sulfa Antibiotics Rash  . Garlic Diarrhea  . Onion Diarrhea   Family History  Problem Relation Age of Onset  . Cancer Father   Also, diabetes in father, maternal grandfather Hypertension in mother Heart disease in maternal grandfather  Past Medical History:  Diagnosis Date  . Allergy   . Anxiety   . Cervical cancer ([place]) 2015  . Depression   . Fatigue   . Hemorrhoids   . History of UTI   . IBS (irritable bowel syndrome)   . Incisional hernia   . Migraines   . Morbid obesity ([place])   . OSA (obstructive sleep apnea)    uses CPAP nightly  . Perirectal abscess    Past Surgical History:  Procedure Laterality Date  . BILATERAL SALPINGOOPHORECTOMY  2015  . BREAST SURGERY Right    2 benign lumps  . COLONOSCOPY    . EVALUATION  UNDER ANESTHESIA WITH ANAL FISTULECTOMY N/A 07/06/2017   Procedure: ANAL EXAM UNDER ANESTHESIA;  Surgeon: Leighton Ruff, MD;  Location: [place];  Service: General;  Laterality: N/A;  . HERNIA REPAIR    . INCISION AND DRAINAGE PERIRECTAL ABSCESS N/A 04/26/2017   Procedure: IRRIGATION AND DEBRIDEMENT PERIRECTAL ABSCESS;  Surgeon: Johnathan Hausen, MD;  [facility];  Service: General;  Laterality: N/A;  . [place] N/A 07/06/2017   Procedure: PLACEMENT OF SETON;  Surgeon: Leighton Ruff, MD;  Location: [place];  Service: General;  Laterality: N/A;  . TOTAL ABDOMINAL HYSTERECTOMY    . WISDOM TOOTH EXTRACTION     Social History   Socioeconomic History  . Marital status: Single    Spouse name: Not on file  . Number of children: Not on file  . Years of education: Not on file  . Highest education level: Not on file  Occupational History  . Not on file  Social Needs  . Financial resource strain: Not on file  . Food insecurity:    Worry: Not on file    Inability: Not on file  . Transportation needs:    Medical: Not on file    Non-medical: Not on file  Tobacco Use  . Smoking status: Former Smoker    Last attempt to quit: 04/26/1998    Years since quitting: 19.3  . Smokeless tobacco: Never Used  Substance and Sexual Activity  . Alcohol use: No    Alcohol/week: 0.0 oz    Comment: social  . Drug use: No  . Sexual activity: Never    Birth control/protection: Surgical  Lifestyle  . Physical activity:    Days per week: Not on file    Minutes per session: Not on file  . Stress: Not on file  Relationships  . Social connections:    Talks on phone: Not on file    Gets together: Not on file    Attends religious service: Not on file    Active member of club or organization: Not on file    Attends meetings of clubs or organizations: Not on file    Relationship status: Not on file  . Intimate partner violence:    Fear of current or ex  partner: Not on file    Emotionally abused: Not on file    Physically abused: Not on  file    Forced sexual activity: Not on file  Other Topics Concern  . Not on file  Social History Narrative   ** Merged History Encounter **       Current Outpatient Medications on File Prior to Visit  Medication Sig Dispense Refill  . aspirin-acetaminophen-caffeine (EXCEDRIN MIGRAINE) 250-250-65 MG tablet Take 3 tablets by mouth every 6 (six) hours as needed for headache.    . cholecalciferol (VITAMIN D) 1000 UNITS tablet Take 1,000 Units by mouth daily.    Marland Kitchen desvenlafaxine (PRISTIQ) 50 MG 24 hr tablet Take 50 mg by mouth daily.  1  . diphenhydramine-acetaminophen (TYLENOL PM) 25-500 MG TABS tablet Take 2 tablets by mouth at bedtime as needed (for sleep/pain).    . Multiple Vitamin (MULTIVITAMIN WITH MINERALS) TABS tablet Take 1 tablet by mouth daily.    Marland Kitchen VIVELLE-DOT 0.1 MG/24HR patch Place 1 patch (0.1 mg total) onto the skin 2 (two) times a week. 8 patch 12  . VYVANSE 30 MG capsule Take 30 mg by mouth daily.  0   No current facility-administered medications on file prior to visit.    Allergies  Allergen Reactions  . Amoxicillin Rash    Has patient had a PCN reaction causing immediate rash, facial/tongue/throat swelling, SOB or lightheadedness with hypotension: Unknown Has patient had a PCN reaction causing severe rash involving mucus membranes or skin necrosis: Yes Has patient had a PCN reaction that required hospitalization: No Has patient had a PCN reaction occurring within the last 10 years: No If all of the above answers are "NO", then may proceed with Cephalosporin use.   Marland Kitchen Penicillins Rash    Has patient had a PCN reaction causing immediate rash, facial/tongue/throat swelling, SOB or lightheadedness with hypotension: Unknown Has patient had a PCN reaction causing severe rash involving mucus membranes or skin necrosis: Yes Has patient had a PCN reaction that required hospitalization: No Has  patient had a PCN reaction occurring within the last 10 years: No If all of the above answers are "NO", then may proceed with Cephalosporin use.   . Sulfa Antibiotics Rash  . Garlic Diarrhea  . Onion Diarrhea   Family History  Problem Relation Age of Onset  . Cancer Father     PE: BP 112/74   Pulse 93   Ht 5' 4.5" (1.638 m)   Wt 218 lb (98.9 kg)   SpO2 95%   BMI 36.84 kg/m  Wt Readings from Last 3 Encounters:  08/28/17 218 lb (98.9 kg)  07/06/17 213 lb 12.8 oz (97 kg)  04/26/17 206 lb (93.4 kg)   Constitutional: overweight, in NAD Eyes: PERRLA, EOMI, no exophthalmos ENT: moist mucous membranes, no thyromegaly, no cervical lymphadenopathy Cardiovascular: tachycardia, RR, No MRG Respiratory: CTA B Gastrointestinal: abdomen soft, NT, ND, BS+ Musculoskeletal: no deformities, strength intact in all 4, right arm in cast Skin: moist, warm, no rashes Neurological: no tremor with outstretched hands, DTR normal in all 4  ASSESSMENT: 1. Premature surgical menopause  2.  Vitamin D insufficiency  3. Obesity class 2 BMI Classification:  < 18.5 underweight   18.5-24.9 normal weight   25.0-29.9 overweight   30.0-34.9 class I obesity   35.0-39.9 class II obesity   ? 40.0 class III obesity   PLAN: 1. Patient with history of surgical premature menopause after her TAH + BSO for cervical cancer.  She was initially started on Climara, but she was having problems with the patch as she was not able to keep it in  place.  She was then switched to estradiol gel (Divigel) which she applied to the buttocks area/lower back, however, she was concerned about transferring this to her daughter.  Also she was not happy that this took a long time to dry and also it was expensive.  At last visit, we discussed about the importance of staying on estrogen until the average age of menopause, a 47 years old, to avoid hot flashes, vaginal dryness, and especially decreased bone mineral density.  We  decided to start Vivelle-Dot, the brand name estradiol patch changed twice a week.  She is doing well on this.  We discussed about the possibility of getting another brand since this is expensive, but I would recommend against the Climara patches, changed once a week, especially  since she is also planning to swim during the summer. - No progesterone needed since she does not have a uterus. -Reviewed most recent DEXA scan from 2017 and all the scores were normal.  We will continue to keep an eye on this.  2.  Vitamin D insufficiency -At last visit, she was complaining of fatigue, while being under a lot of stress as a single mom and taking care of her mother.  We checked her TFTs and B12 levels and these were normal.  Vitamin D level was slightly low. -We started vitamin D supplementation 2000 units daily. - will have vitamin D level at first visit with PCP in August.  3. Obesity class 2 -She did not lose weight since last visit, but she dropped several dress sizes and feels much better.  This could be the effect of estrogen.   RTC in 6 mo.  Philemon Kingdom, MD PhD [facility] End[facility]

## 2017-08-28 NOTE — Patient Instructions (Signed)
Please continue Vivelle Dot.  Continue vitamin D 2000 units daily.  Please come back for a follow-up appointment in 6 months.

## 2017-08-29 DIAGNOSIS — J309 Allergic rhinitis, unspecified: Secondary | ICD-10-CM | POA: Diagnosis not present

## 2017-09-07 DIAGNOSIS — F5081 Binge eating disorder: Secondary | ICD-10-CM | POA: Diagnosis not present

## 2017-09-18 DIAGNOSIS — M9903 Segmental and somatic dysfunction of lumbar region: Secondary | ICD-10-CM | POA: Diagnosis not present

## 2017-09-18 DIAGNOSIS — M9901 Segmental and somatic dysfunction of cervical region: Secondary | ICD-10-CM | POA: Diagnosis not present

## 2017-09-18 DIAGNOSIS — R51 Headache: Secondary | ICD-10-CM | POA: Diagnosis not present

## 2017-09-18 DIAGNOSIS — M9902 Segmental and somatic dysfunction of thoracic region: Secondary | ICD-10-CM | POA: Diagnosis not present

## 2017-09-19 DIAGNOSIS — R42 Dizziness and giddiness: Secondary | ICD-10-CM | POA: Diagnosis not present

## 2017-09-19 DIAGNOSIS — H93291 Other abnormal auditory perceptions, right ear: Secondary | ICD-10-CM | POA: Diagnosis not present

## 2017-09-19 DIAGNOSIS — H8101 Meniere's disease, right ear: Secondary | ICD-10-CM | POA: Diagnosis not present

## 2017-09-20 DIAGNOSIS — M9903 Segmental and somatic dysfunction of lumbar region: Secondary | ICD-10-CM | POA: Diagnosis not present

## 2017-09-20 DIAGNOSIS — M9901 Segmental and somatic dysfunction of cervical region: Secondary | ICD-10-CM | POA: Diagnosis not present

## 2017-09-20 DIAGNOSIS — M9902 Segmental and somatic dysfunction of thoracic region: Secondary | ICD-10-CM | POA: Diagnosis not present

## 2017-09-20 DIAGNOSIS — R51 Headache: Secondary | ICD-10-CM | POA: Diagnosis not present

## 2017-09-21 DIAGNOSIS — F331 Major depressive disorder, recurrent, moderate: Secondary | ICD-10-CM | POA: Diagnosis not present

## 2017-09-25 DIAGNOSIS — M9903 Segmental and somatic dysfunction of lumbar region: Secondary | ICD-10-CM | POA: Diagnosis not present

## 2017-09-25 DIAGNOSIS — S52501D Unspecified fracture of the lower end of right radius, subsequent encounter for closed fracture with routine healing: Secondary | ICD-10-CM | POA: Diagnosis not present

## 2017-09-25 DIAGNOSIS — R51 Headache: Secondary | ICD-10-CM | POA: Diagnosis not present

## 2017-09-25 DIAGNOSIS — M9901 Segmental and somatic dysfunction of cervical region: Secondary | ICD-10-CM | POA: Diagnosis not present

## 2017-09-25 DIAGNOSIS — M9902 Segmental and somatic dysfunction of thoracic region: Secondary | ICD-10-CM | POA: Diagnosis not present

## 2017-09-26 DIAGNOSIS — M9901 Segmental and somatic dysfunction of cervical region: Secondary | ICD-10-CM | POA: Diagnosis not present

## 2017-09-26 DIAGNOSIS — M6281 Muscle weakness (generalized): Secondary | ICD-10-CM | POA: Diagnosis not present

## 2017-09-26 DIAGNOSIS — M25641 Stiffness of right hand, not elsewhere classified: Secondary | ICD-10-CM | POA: Diagnosis not present

## 2017-09-26 DIAGNOSIS — M9902 Segmental and somatic dysfunction of thoracic region: Secondary | ICD-10-CM | POA: Diagnosis not present

## 2017-09-26 DIAGNOSIS — M9903 Segmental and somatic dysfunction of lumbar region: Secondary | ICD-10-CM | POA: Diagnosis not present

## 2017-09-26 DIAGNOSIS — M25541 Pain in joints of right hand: Secondary | ICD-10-CM | POA: Diagnosis not present

## 2017-09-26 DIAGNOSIS — R51 Headache: Secondary | ICD-10-CM | POA: Diagnosis not present

## 2017-09-27 DIAGNOSIS — F900 Attention-deficit hyperactivity disorder, predominantly inattentive type: Secondary | ICD-10-CM | POA: Diagnosis not present

## 2017-09-27 DIAGNOSIS — F5002 Anorexia nervosa, binge eating/purging type: Secondary | ICD-10-CM | POA: Diagnosis not present

## 2017-09-27 DIAGNOSIS — F331 Major depressive disorder, recurrent, moderate: Secondary | ICD-10-CM | POA: Diagnosis not present

## 2017-09-28 DIAGNOSIS — R51 Headache: Secondary | ICD-10-CM | POA: Diagnosis not present

## 2017-09-28 DIAGNOSIS — M9903 Segmental and somatic dysfunction of lumbar region: Secondary | ICD-10-CM | POA: Diagnosis not present

## 2017-09-28 DIAGNOSIS — S52514S Nondisplaced fracture of right radial styloid process, sequela: Secondary | ICD-10-CM | POA: Diagnosis not present

## 2017-09-28 DIAGNOSIS — M9901 Segmental and somatic dysfunction of cervical region: Secondary | ICD-10-CM | POA: Diagnosis not present

## 2017-09-28 DIAGNOSIS — M25541 Pain in joints of right hand: Secondary | ICD-10-CM | POA: Diagnosis not present

## 2017-09-28 DIAGNOSIS — M9902 Segmental and somatic dysfunction of thoracic region: Secondary | ICD-10-CM | POA: Diagnosis not present

## 2017-09-28 DIAGNOSIS — M25641 Stiffness of right hand, not elsewhere classified: Secondary | ICD-10-CM | POA: Diagnosis not present

## 2017-09-28 DIAGNOSIS — M6281 Muscle weakness (generalized): Secondary | ICD-10-CM | POA: Diagnosis not present

## 2017-09-28 DIAGNOSIS — F338 Other recurrent depressive disorders: Secondary | ICD-10-CM | POA: Diagnosis not present

## 2017-10-02 DIAGNOSIS — S52514S Nondisplaced fracture of right radial styloid process, sequela: Secondary | ICD-10-CM | POA: Diagnosis not present

## 2017-10-02 DIAGNOSIS — M546 Pain in thoracic spine: Secondary | ICD-10-CM | POA: Diagnosis not present

## 2017-10-02 DIAGNOSIS — M542 Cervicalgia: Secondary | ICD-10-CM | POA: Diagnosis not present

## 2017-10-02 DIAGNOSIS — M545 Low back pain: Secondary | ICD-10-CM | POA: Diagnosis not present

## 2017-10-02 DIAGNOSIS — M6281 Muscle weakness (generalized): Secondary | ICD-10-CM | POA: Diagnosis not present

## 2017-10-02 DIAGNOSIS — G4733 Obstructive sleep apnea (adult) (pediatric): Secondary | ICD-10-CM | POA: Diagnosis not present

## 2017-10-02 DIAGNOSIS — R51 Headache: Secondary | ICD-10-CM | POA: Diagnosis not present

## 2017-10-02 DIAGNOSIS — M25541 Pain in joints of right hand: Secondary | ICD-10-CM | POA: Diagnosis not present

## 2017-10-02 DIAGNOSIS — G4710 Hypersomnia, unspecified: Secondary | ICD-10-CM | POA: Diagnosis not present

## 2017-10-02 DIAGNOSIS — M25641 Stiffness of right hand, not elsewhere classified: Secondary | ICD-10-CM | POA: Diagnosis not present

## 2017-10-03 DIAGNOSIS — M9902 Segmental and somatic dysfunction of thoracic region: Secondary | ICD-10-CM | POA: Diagnosis not present

## 2017-10-03 DIAGNOSIS — R51 Headache: Secondary | ICD-10-CM | POA: Diagnosis not present

## 2017-10-03 DIAGNOSIS — M9901 Segmental and somatic dysfunction of cervical region: Secondary | ICD-10-CM | POA: Diagnosis not present

## 2017-10-03 DIAGNOSIS — M9903 Segmental and somatic dysfunction of lumbar region: Secondary | ICD-10-CM | POA: Diagnosis not present

## 2017-10-04 DIAGNOSIS — R51 Headache: Secondary | ICD-10-CM | POA: Diagnosis not present

## 2017-10-04 DIAGNOSIS — M545 Low back pain: Secondary | ICD-10-CM | POA: Diagnosis not present

## 2017-10-04 DIAGNOSIS — M546 Pain in thoracic spine: Secondary | ICD-10-CM | POA: Diagnosis not present

## 2017-10-04 DIAGNOSIS — M542 Cervicalgia: Secondary | ICD-10-CM | POA: Diagnosis not present

## 2017-10-04 DIAGNOSIS — M6281 Muscle weakness (generalized): Secondary | ICD-10-CM | POA: Diagnosis not present

## 2017-10-04 DIAGNOSIS — M25641 Stiffness of right hand, not elsewhere classified: Secondary | ICD-10-CM | POA: Diagnosis not present

## 2017-10-04 DIAGNOSIS — S52514S Nondisplaced fracture of right radial styloid process, sequela: Secondary | ICD-10-CM | POA: Diagnosis not present

## 2017-10-04 DIAGNOSIS — M25541 Pain in joints of right hand: Secondary | ICD-10-CM | POA: Diagnosis not present

## 2017-10-05 DIAGNOSIS — R51 Headache: Secondary | ICD-10-CM | POA: Diagnosis not present

## 2017-10-05 DIAGNOSIS — M9903 Segmental and somatic dysfunction of lumbar region: Secondary | ICD-10-CM | POA: Diagnosis not present

## 2017-10-05 DIAGNOSIS — M9901 Segmental and somatic dysfunction of cervical region: Secondary | ICD-10-CM | POA: Diagnosis not present

## 2017-10-05 DIAGNOSIS — M9902 Segmental and somatic dysfunction of thoracic region: Secondary | ICD-10-CM | POA: Diagnosis not present

## 2017-10-06 DIAGNOSIS — M546 Pain in thoracic spine: Secondary | ICD-10-CM | POA: Diagnosis not present

## 2017-10-06 DIAGNOSIS — M545 Low back pain: Secondary | ICD-10-CM | POA: Diagnosis not present

## 2017-10-06 DIAGNOSIS — R51 Headache: Secondary | ICD-10-CM | POA: Diagnosis not present

## 2017-10-06 DIAGNOSIS — M542 Cervicalgia: Secondary | ICD-10-CM | POA: Diagnosis not present

## 2017-10-11 DIAGNOSIS — S52501D Unspecified fracture of the lower end of right radius, subsequent encounter for closed fracture with routine healing: Secondary | ICD-10-CM | POA: Diagnosis not present

## 2017-10-16 DIAGNOSIS — M25641 Stiffness of right hand, not elsewhere classified: Secondary | ICD-10-CM | POA: Diagnosis not present

## 2017-10-16 DIAGNOSIS — M25541 Pain in joints of right hand: Secondary | ICD-10-CM | POA: Diagnosis not present

## 2017-10-16 DIAGNOSIS — M6281 Muscle weakness (generalized): Secondary | ICD-10-CM | POA: Diagnosis not present

## 2017-10-16 DIAGNOSIS — S52514S Nondisplaced fracture of right radial styloid process, sequela: Secondary | ICD-10-CM | POA: Diagnosis not present

## 2017-10-17 DIAGNOSIS — M9903 Segmental and somatic dysfunction of lumbar region: Secondary | ICD-10-CM | POA: Diagnosis not present

## 2017-10-17 DIAGNOSIS — M9902 Segmental and somatic dysfunction of thoracic region: Secondary | ICD-10-CM | POA: Diagnosis not present

## 2017-10-17 DIAGNOSIS — M9901 Segmental and somatic dysfunction of cervical region: Secondary | ICD-10-CM | POA: Diagnosis not present

## 2017-10-17 DIAGNOSIS — R51 Headache: Secondary | ICD-10-CM | POA: Diagnosis not present

## 2017-10-18 DIAGNOSIS — M25541 Pain in joints of right hand: Secondary | ICD-10-CM | POA: Diagnosis not present

## 2017-10-18 DIAGNOSIS — M542 Cervicalgia: Secondary | ICD-10-CM | POA: Diagnosis not present

## 2017-10-18 DIAGNOSIS — M546 Pain in thoracic spine: Secondary | ICD-10-CM | POA: Diagnosis not present

## 2017-10-18 DIAGNOSIS — R51 Headache: Secondary | ICD-10-CM | POA: Diagnosis not present

## 2017-10-18 DIAGNOSIS — G4733 Obstructive sleep apnea (adult) (pediatric): Secondary | ICD-10-CM | POA: Diagnosis not present

## 2017-10-18 DIAGNOSIS — G4710 Hypersomnia, unspecified: Secondary | ICD-10-CM | POA: Diagnosis not present

## 2017-10-18 DIAGNOSIS — M545 Low back pain: Secondary | ICD-10-CM | POA: Diagnosis not present

## 2017-10-18 DIAGNOSIS — S52514S Nondisplaced fracture of right radial styloid process, sequela: Secondary | ICD-10-CM | POA: Diagnosis not present

## 2017-10-18 DIAGNOSIS — M6281 Muscle weakness (generalized): Secondary | ICD-10-CM | POA: Diagnosis not present

## 2017-10-18 DIAGNOSIS — M25641 Stiffness of right hand, not elsewhere classified: Secondary | ICD-10-CM | POA: Diagnosis not present

## 2017-10-19 DIAGNOSIS — M9902 Segmental and somatic dysfunction of thoracic region: Secondary | ICD-10-CM | POA: Diagnosis not present

## 2017-10-19 DIAGNOSIS — M9903 Segmental and somatic dysfunction of lumbar region: Secondary | ICD-10-CM | POA: Diagnosis not present

## 2017-10-19 DIAGNOSIS — M9901 Segmental and somatic dysfunction of cervical region: Secondary | ICD-10-CM | POA: Diagnosis not present

## 2017-10-19 DIAGNOSIS — F331 Major depressive disorder, recurrent, moderate: Secondary | ICD-10-CM | POA: Diagnosis not present

## 2017-10-19 DIAGNOSIS — F5081 Binge eating disorder: Secondary | ICD-10-CM | POA: Diagnosis not present

## 2017-10-19 DIAGNOSIS — R51 Headache: Secondary | ICD-10-CM | POA: Diagnosis not present

## 2017-10-20 DIAGNOSIS — M545 Low back pain: Secondary | ICD-10-CM | POA: Diagnosis not present

## 2017-10-20 DIAGNOSIS — M542 Cervicalgia: Secondary | ICD-10-CM | POA: Diagnosis not present

## 2017-10-20 DIAGNOSIS — M546 Pain in thoracic spine: Secondary | ICD-10-CM | POA: Diagnosis not present

## 2017-10-20 DIAGNOSIS — R51 Headache: Secondary | ICD-10-CM | POA: Diagnosis not present

## 2017-11-06 DIAGNOSIS — M25541 Pain in joints of right hand: Secondary | ICD-10-CM | POA: Diagnosis not present

## 2017-11-06 DIAGNOSIS — R51 Headache: Secondary | ICD-10-CM | POA: Diagnosis not present

## 2017-11-06 DIAGNOSIS — S52514S Nondisplaced fracture of right radial styloid process, sequela: Secondary | ICD-10-CM | POA: Diagnosis not present

## 2017-11-06 DIAGNOSIS — M25641 Stiffness of right hand, not elsewhere classified: Secondary | ICD-10-CM | POA: Diagnosis not present

## 2017-11-06 DIAGNOSIS — F900 Attention-deficit hyperactivity disorder, predominantly inattentive type: Secondary | ICD-10-CM | POA: Diagnosis not present

## 2017-11-06 DIAGNOSIS — M542 Cervicalgia: Secondary | ICD-10-CM | POA: Diagnosis not present

## 2017-11-06 DIAGNOSIS — M546 Pain in thoracic spine: Secondary | ICD-10-CM | POA: Diagnosis not present

## 2017-11-06 DIAGNOSIS — M6281 Muscle weakness (generalized): Secondary | ICD-10-CM | POA: Diagnosis not present

## 2017-11-06 DIAGNOSIS — F502 Bulimia nervosa: Secondary | ICD-10-CM | POA: Diagnosis not present

## 2017-11-06 DIAGNOSIS — F331 Major depressive disorder, recurrent, moderate: Secondary | ICD-10-CM | POA: Diagnosis not present

## 2017-11-06 DIAGNOSIS — M545 Low back pain: Secondary | ICD-10-CM | POA: Diagnosis not present

## 2017-11-08 DIAGNOSIS — G43719 Chronic migraine without aura, intractable, without status migrainosus: Secondary | ICD-10-CM | POA: Diagnosis not present

## 2017-11-09 DIAGNOSIS — F419 Anxiety disorder, unspecified: Secondary | ICD-10-CM | POA: Diagnosis not present

## 2017-11-09 DIAGNOSIS — F4311 Post-traumatic stress disorder, acute: Secondary | ICD-10-CM | POA: Diagnosis not present

## 2017-11-10 DIAGNOSIS — R51 Headache: Secondary | ICD-10-CM | POA: Diagnosis not present

## 2017-11-10 DIAGNOSIS — M542 Cervicalgia: Secondary | ICD-10-CM | POA: Diagnosis not present

## 2017-11-10 DIAGNOSIS — M546 Pain in thoracic spine: Secondary | ICD-10-CM | POA: Diagnosis not present

## 2017-11-10 DIAGNOSIS — M545 Low back pain: Secondary | ICD-10-CM | POA: Diagnosis not present

## 2017-11-13 DIAGNOSIS — C539 Malignant neoplasm of cervix uteri, unspecified: Secondary | ICD-10-CM | POA: Diagnosis not present

## 2017-11-13 DIAGNOSIS — I10 Essential (primary) hypertension: Secondary | ICD-10-CM | POA: Diagnosis not present

## 2017-11-14 DIAGNOSIS — K603 Anal fistula: Secondary | ICD-10-CM | POA: Diagnosis not present

## 2017-11-14 DIAGNOSIS — R51 Headache: Secondary | ICD-10-CM | POA: Diagnosis not present

## 2017-11-14 DIAGNOSIS — M9903 Segmental and somatic dysfunction of lumbar region: Secondary | ICD-10-CM | POA: Diagnosis not present

## 2017-11-14 DIAGNOSIS — M9902 Segmental and somatic dysfunction of thoracic region: Secondary | ICD-10-CM | POA: Diagnosis not present

## 2017-11-14 DIAGNOSIS — M9901 Segmental and somatic dysfunction of cervical region: Secondary | ICD-10-CM | POA: Diagnosis not present

## 2017-11-17 DIAGNOSIS — I10 Essential (primary) hypertension: Secondary | ICD-10-CM | POA: Diagnosis not present

## 2017-11-17 DIAGNOSIS — M542 Cervicalgia: Secondary | ICD-10-CM | POA: Diagnosis not present

## 2017-11-17 DIAGNOSIS — G43909 Migraine, unspecified, not intractable, without status migrainosus: Secondary | ICD-10-CM | POA: Diagnosis not present

## 2017-11-17 DIAGNOSIS — Z87891 Personal history of nicotine dependence: Secondary | ICD-10-CM | POA: Diagnosis not present

## 2017-11-17 DIAGNOSIS — Z9889 Other specified postprocedural states: Secondary | ICD-10-CM | POA: Diagnosis not present

## 2017-11-17 DIAGNOSIS — M546 Pain in thoracic spine: Secondary | ICD-10-CM | POA: Diagnosis not present

## 2017-11-17 DIAGNOSIS — G43709 Chronic migraine without aura, not intractable, without status migrainosus: Secondary | ICD-10-CM | POA: Diagnosis not present

## 2017-11-17 DIAGNOSIS — R51 Headache: Secondary | ICD-10-CM | POA: Diagnosis not present

## 2017-11-17 DIAGNOSIS — M545 Low back pain: Secondary | ICD-10-CM | POA: Diagnosis not present

## 2017-11-17 DIAGNOSIS — Z87898 Personal history of other specified conditions: Secondary | ICD-10-CM | POA: Diagnosis not present

## 2017-11-17 DIAGNOSIS — E785 Hyperlipidemia, unspecified: Secondary | ICD-10-CM | POA: Diagnosis not present

## 2017-11-17 DIAGNOSIS — F329 Major depressive disorder, single episode, unspecified: Secondary | ICD-10-CM | POA: Diagnosis not present

## 2017-11-22 DIAGNOSIS — M542 Cervicalgia: Secondary | ICD-10-CM | POA: Diagnosis not present

## 2017-11-22 DIAGNOSIS — R51 Headache: Secondary | ICD-10-CM | POA: Diagnosis not present

## 2017-11-22 DIAGNOSIS — M546 Pain in thoracic spine: Secondary | ICD-10-CM | POA: Diagnosis not present

## 2017-11-22 DIAGNOSIS — M545 Low back pain: Secondary | ICD-10-CM | POA: Diagnosis not present

## 2017-11-27 ENCOUNTER — Ambulatory Visit: Payer: Self-pay | Admitting: General Surgery

## 2017-11-29 DIAGNOSIS — M545 Low back pain: Secondary | ICD-10-CM | POA: Diagnosis not present

## 2017-11-29 DIAGNOSIS — M546 Pain in thoracic spine: Secondary | ICD-10-CM | POA: Diagnosis not present

## 2017-11-29 DIAGNOSIS — M542 Cervicalgia: Secondary | ICD-10-CM | POA: Diagnosis not present

## 2017-11-29 DIAGNOSIS — R51 Headache: Secondary | ICD-10-CM | POA: Diagnosis not present

## 2017-11-30 DIAGNOSIS — M9903 Segmental and somatic dysfunction of lumbar region: Secondary | ICD-10-CM | POA: Diagnosis not present

## 2017-11-30 DIAGNOSIS — F5081 Binge eating disorder: Secondary | ICD-10-CM | POA: Diagnosis not present

## 2017-11-30 DIAGNOSIS — M9902 Segmental and somatic dysfunction of thoracic region: Secondary | ICD-10-CM | POA: Diagnosis not present

## 2017-11-30 DIAGNOSIS — F4323 Adjustment disorder with mixed anxiety and depressed mood: Secondary | ICD-10-CM | POA: Diagnosis not present

## 2017-11-30 DIAGNOSIS — R51 Headache: Secondary | ICD-10-CM | POA: Diagnosis not present

## 2017-11-30 DIAGNOSIS — M9901 Segmental and somatic dysfunction of cervical region: Secondary | ICD-10-CM | POA: Diagnosis not present

## 2017-12-11 DIAGNOSIS — M542 Cervicalgia: Secondary | ICD-10-CM | POA: Diagnosis not present

## 2017-12-11 DIAGNOSIS — M791 Myalgia, unspecified site: Secondary | ICD-10-CM | POA: Diagnosis not present

## 2017-12-11 DIAGNOSIS — G518 Other disorders of facial nerve: Secondary | ICD-10-CM | POA: Diagnosis not present

## 2017-12-11 DIAGNOSIS — G43719 Chronic migraine without aura, intractable, without status migrainosus: Secondary | ICD-10-CM | POA: Diagnosis not present

## 2017-12-12 DIAGNOSIS — M9903 Segmental and somatic dysfunction of lumbar region: Secondary | ICD-10-CM | POA: Diagnosis not present

## 2017-12-12 DIAGNOSIS — R51 Headache: Secondary | ICD-10-CM | POA: Diagnosis not present

## 2017-12-12 DIAGNOSIS — M9901 Segmental and somatic dysfunction of cervical region: Secondary | ICD-10-CM | POA: Diagnosis not present

## 2017-12-12 DIAGNOSIS — M9902 Segmental and somatic dysfunction of thoracic region: Secondary | ICD-10-CM | POA: Diagnosis not present

## 2017-12-13 ENCOUNTER — Encounter (HOSPITAL_BASED_OUTPATIENT_CLINIC_OR_DEPARTMENT_OTHER): Payer: Self-pay | Admitting: *Deleted

## 2017-12-14 DIAGNOSIS — M9903 Segmental and somatic dysfunction of lumbar region: Secondary | ICD-10-CM | POA: Diagnosis not present

## 2017-12-14 DIAGNOSIS — M9902 Segmental and somatic dysfunction of thoracic region: Secondary | ICD-10-CM | POA: Diagnosis not present

## 2017-12-14 DIAGNOSIS — R51 Headache: Secondary | ICD-10-CM | POA: Diagnosis not present

## 2017-12-14 DIAGNOSIS — M9901 Segmental and somatic dysfunction of cervical region: Secondary | ICD-10-CM | POA: Diagnosis not present

## 2017-12-15 DIAGNOSIS — M546 Pain in thoracic spine: Secondary | ICD-10-CM | POA: Diagnosis not present

## 2017-12-15 DIAGNOSIS — M545 Low back pain: Secondary | ICD-10-CM | POA: Diagnosis not present

## 2017-12-15 DIAGNOSIS — R51 Headache: Secondary | ICD-10-CM | POA: Diagnosis not present

## 2017-12-15 DIAGNOSIS — M542 Cervicalgia: Secondary | ICD-10-CM | POA: Diagnosis not present

## 2017-12-18 DIAGNOSIS — M9903 Segmental and somatic dysfunction of lumbar region: Secondary | ICD-10-CM | POA: Diagnosis not present

## 2017-12-18 DIAGNOSIS — M9901 Segmental and somatic dysfunction of cervical region: Secondary | ICD-10-CM | POA: Diagnosis not present

## 2017-12-18 DIAGNOSIS — M9902 Segmental and somatic dysfunction of thoracic region: Secondary | ICD-10-CM | POA: Diagnosis not present

## 2017-12-18 DIAGNOSIS — R51 Headache: Secondary | ICD-10-CM | POA: Diagnosis not present

## 2017-12-19 DIAGNOSIS — M542 Cervicalgia: Secondary | ICD-10-CM | POA: Diagnosis not present

## 2017-12-19 DIAGNOSIS — M545 Low back pain: Secondary | ICD-10-CM | POA: Diagnosis not present

## 2017-12-19 DIAGNOSIS — M546 Pain in thoracic spine: Secondary | ICD-10-CM | POA: Diagnosis not present

## 2017-12-19 DIAGNOSIS — R51 Headache: Secondary | ICD-10-CM | POA: Diagnosis not present

## 2017-12-20 DIAGNOSIS — M9902 Segmental and somatic dysfunction of thoracic region: Secondary | ICD-10-CM | POA: Diagnosis not present

## 2017-12-20 DIAGNOSIS — M9901 Segmental and somatic dysfunction of cervical region: Secondary | ICD-10-CM | POA: Diagnosis not present

## 2017-12-20 DIAGNOSIS — R51 Headache: Secondary | ICD-10-CM | POA: Diagnosis not present

## 2017-12-20 DIAGNOSIS — M9903 Segmental and somatic dysfunction of lumbar region: Secondary | ICD-10-CM | POA: Diagnosis not present

## 2017-12-21 ENCOUNTER — Ambulatory Visit (HOSPITAL_BASED_OUTPATIENT_CLINIC_OR_DEPARTMENT_OTHER)
Admission: RE | Admit: 2017-12-21 | Payer: BLUE CROSS/BLUE SHIELD | Source: Ambulatory Visit | Admitting: General Surgery

## 2017-12-21 DIAGNOSIS — F5081 Binge eating disorder: Secondary | ICD-10-CM | POA: Diagnosis not present

## 2017-12-21 DIAGNOSIS — F331 Major depressive disorder, recurrent, moderate: Secondary | ICD-10-CM | POA: Diagnosis not present

## 2017-12-21 HISTORY — DX: Personal history of antineoplastic chemotherapy: Z92.21

## 2017-12-21 HISTORY — DX: Dependence on other enabling machines and devices: Z99.89

## 2017-12-21 HISTORY — DX: Personal history of irradiation: Z92.3

## 2017-12-21 HISTORY — DX: Solitary pulmonary nodule: R91.1

## 2017-12-21 HISTORY — DX: Anal fistula: K60.3

## 2017-12-21 HISTORY — DX: Obstructive sleep apnea (adult) (pediatric): G47.33

## 2017-12-21 SURGERY — LIGATION, INTERNAL FISTULA TRACT
Anesthesia: General

## 2017-12-26 DIAGNOSIS — R51 Headache: Secondary | ICD-10-CM | POA: Diagnosis not present

## 2017-12-26 DIAGNOSIS — M9901 Segmental and somatic dysfunction of cervical region: Secondary | ICD-10-CM | POA: Diagnosis not present

## 2017-12-26 DIAGNOSIS — M9902 Segmental and somatic dysfunction of thoracic region: Secondary | ICD-10-CM | POA: Diagnosis not present

## 2017-12-28 DIAGNOSIS — F5081 Binge eating disorder: Secondary | ICD-10-CM | POA: Diagnosis not present

## 2017-12-28 DIAGNOSIS — F4310 Post-traumatic stress disorder, unspecified: Secondary | ICD-10-CM | POA: Diagnosis not present

## 2018-01-03 DIAGNOSIS — M542 Cervicalgia: Secondary | ICD-10-CM | POA: Diagnosis not present

## 2018-01-03 DIAGNOSIS — M545 Low back pain: Secondary | ICD-10-CM | POA: Diagnosis not present

## 2018-01-03 DIAGNOSIS — M546 Pain in thoracic spine: Secondary | ICD-10-CM | POA: Diagnosis not present

## 2018-01-03 DIAGNOSIS — R51 Headache: Secondary | ICD-10-CM | POA: Diagnosis not present

## 2018-01-04 DIAGNOSIS — F5081 Binge eating disorder: Secondary | ICD-10-CM | POA: Diagnosis not present

## 2018-01-04 DIAGNOSIS — F502 Bulimia nervosa: Secondary | ICD-10-CM | POA: Diagnosis not present

## 2018-01-04 DIAGNOSIS — F331 Major depressive disorder, recurrent, moderate: Secondary | ICD-10-CM | POA: Diagnosis not present

## 2018-01-04 DIAGNOSIS — F900 Attention-deficit hyperactivity disorder, predominantly inattentive type: Secondary | ICD-10-CM | POA: Diagnosis not present

## 2018-01-04 DIAGNOSIS — F411 Generalized anxiety disorder: Secondary | ICD-10-CM | POA: Diagnosis not present

## 2018-01-11 DIAGNOSIS — M542 Cervicalgia: Secondary | ICD-10-CM | POA: Diagnosis not present

## 2018-01-11 DIAGNOSIS — F4323 Adjustment disorder with mixed anxiety and depressed mood: Secondary | ICD-10-CM | POA: Diagnosis not present

## 2018-01-11 DIAGNOSIS — M546 Pain in thoracic spine: Secondary | ICD-10-CM | POA: Diagnosis not present

## 2018-01-11 DIAGNOSIS — M545 Low back pain: Secondary | ICD-10-CM | POA: Diagnosis not present

## 2018-01-11 DIAGNOSIS — R51 Headache: Secondary | ICD-10-CM | POA: Diagnosis not present

## 2018-01-29 ENCOUNTER — Ambulatory Visit: Payer: Self-pay | Admitting: General Surgery

## 2018-01-29 NOTE — H&P (Signed)
History of Present Illness Alison Ruff MD; 3/81/8299 1:56 PM) The patient is a 47 year old female presenting for a post-operative visit. 47 year old female status post seton placement with February 2019. She comes in today ready to schedule her secondary procedure. She is having some mild irritation of her gluteal cleft. She continues to have purulent drainage on a daily basis.   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 Hysterectomy (due to cancer) - Complete Oral Surgery  Diagnostic Studies History Alison Ruff, MD; 7/51/0258 1:56 PM) Colonoscopy 1-5 years ago Mammogram within last year Pap Smear 1-5 years ago  Allergies Alison Lorenzo, LPN; 10/10/7822 2:35 PM) Amoxicillin *PENICILLINS* Penicillin G Sodium *PENICILLINS* Sulfa 10 *OPHTHALMIC AGENTS* Egg/Pro *DIETARY PRODUCTS/DIETARY MANAGEMENT PRODUCTS*  Medication History Alison Lorenzo, LPN; 3/61/4431 5:40 PM) amLODIPine Besylate (5MG  Tablet, Oral) Active. Meloxicam (7.5MG  Tablet, Oral) Active. Baclofen (10MG  Tablet, Oral) Active. ChlorproMAZINE HCl (25MG  Tablet, Oral) Active. Desvenlafaxine Succinate ER (100MG  Tablet ER 24HR, Oral) Active. Tylenol (325MG  Tablet, Oral) Active. Pristiq (50MG  Tablet ER 24HR, Oral) Active. Vyvanse (30MG  Capsule, Oral) Active. Divigel (1MG /GM Gel, Transdermal) Active. Vitamin D (Cholecalciferol) (1000UNIT Tablet, Oral) Active. Multiple Vitamin (Oral) Active. Medications Reconciled  Social History Alison Ruff, MD; 0/86/7619 1:56 PM) Alcohol use Occasional alcohol use. Caffeine use Carbonated beverages. No drug use Tobacco use Former smoker.  Family History Alison Ruff, MD; 09/21/3265 1:56 PM) Cancer  Father. Diabetes Mellitus Father. Hypertension Mother.  Pregnancy / Birth History Alison Ruff, MD; 06/09/5807 1:56 PM) Age at menarche 56 years. Contraceptive History Oral contraceptives. Gravida 1 Maternal age 59-40 Para 1 Regular periods  Other Problems Alison Ruff, MD; 9/83/3825 1:56 PM) Cervical Cancer Hemorrhoids Inguinal Hernia Lump In Breast Migraine Headache Oophorectomy Bilateral.     Review of Systems Alison Ruff MD; 0/53/9767 1:57 PM) General Present- Weight Loss (Intentional). Not Present- Appetite Loss, Chills, Fatigue, Fever, Night Sweats and Weight Gain. HEENT Present- Wears glasses/contact lenses. Not Present- Earache, Hearing Loss, Hoarseness, Nose Bleed, Oral Ulcers, Ringing in the Ears, Seasonal Allergies, Sinus Pain, Sore Throat, Visual Disturbances and Yellow Eyes. Respiratory Not Present- Chronic Cough, Difficulty Breathing and Sputum Production. Cardiovascular Present- Elevated Blood Pressure. Not Present- Chest Pain. Gastrointestinal Present- Rectal Pain. Not Present- Abdominal Pain, Bloating, Bloody Stool, Change in Bowel Habits, Chronic diarrhea, Constipation, Difficulty Swallowing, Excessive gas, Gets full quickly at meals, Hemorrhoids, Indigestion, Nausea and Vomiting.  Vitals Alison Billings Dockery LPN; 3/41/9379 0:24 PM) 01/29/2018 1:41 PM Weight: 236.6 lb Height: 64in Body Surface Area: 2.1 m Body Mass Index: 40.61 kg/m  Temp.: 98.61F(Temporal)  Pulse: 99 (Regular)  BP: 120/76 (Sitting, Right Arm, Standard)      Physical Exam Alison Ruff MD; 0/97/3532 1:58 PM)  The physical exam findings are as follows: Note:GENERAL: Well-developed, well nourished female - seems anxious  RESPIRATORY: Normal effort, no use of accessory muscles  MUSCULOSKELETAL: Normal gait Grossly normal ROM upper extremities Grossly normal ROM lower extremities  SKIN: Warm and dry Not diaphoretic  PSYCHIATRIC: Normal judgement  and insight Normal mood and affect Alert, oriented x 3  Rectal Note: Superficial opening on left lateral perianal region, about 3 cm from anal verge There is no remaining cellulitis or induration, no obvious fluctuance  Seton in place, LA perianal region    Assessment & Plan Alison Ruff MD; 9/92/4268 1:54 PM)  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 She is now ready to proceed with her secondary procedure. We will plan on performing a ligation of internal fistula tract. We have discussed this in detail including the 20% recurrence rate. I believe she understands this and wishes to proceed with surgery.

## 2018-02-13 DIAGNOSIS — M542 Cervicalgia: Secondary | ICD-10-CM | POA: Diagnosis not present

## 2018-02-13 DIAGNOSIS — M545 Low back pain: Secondary | ICD-10-CM | POA: Diagnosis not present

## 2018-02-13 DIAGNOSIS — R51 Headache: Secondary | ICD-10-CM | POA: Diagnosis not present

## 2018-02-13 DIAGNOSIS — M546 Pain in thoracic spine: Secondary | ICD-10-CM | POA: Diagnosis not present

## 2018-02-15 DIAGNOSIS — F331 Major depressive disorder, recurrent, moderate: Secondary | ICD-10-CM | POA: Diagnosis not present

## 2018-02-20 DIAGNOSIS — M546 Pain in thoracic spine: Secondary | ICD-10-CM | POA: Diagnosis not present

## 2018-02-20 DIAGNOSIS — M542 Cervicalgia: Secondary | ICD-10-CM | POA: Diagnosis not present

## 2018-02-20 DIAGNOSIS — M545 Low back pain: Secondary | ICD-10-CM | POA: Diagnosis not present

## 2018-02-20 DIAGNOSIS — R51 Headache: Secondary | ICD-10-CM | POA: Diagnosis not present

## 2018-02-22 DIAGNOSIS — Z0000 Encounter for general adult medical examination without abnormal findings: Secondary | ICD-10-CM | POA: Diagnosis not present

## 2018-02-27 ENCOUNTER — Ambulatory Visit: Payer: Self-pay | Admitting: Internal Medicine

## 2018-02-27 DIAGNOSIS — M546 Pain in thoracic spine: Secondary | ICD-10-CM | POA: Diagnosis not present

## 2018-02-27 DIAGNOSIS — M542 Cervicalgia: Secondary | ICD-10-CM | POA: Diagnosis not present

## 2018-02-27 DIAGNOSIS — R51 Headache: Secondary | ICD-10-CM | POA: Diagnosis not present

## 2018-02-27 DIAGNOSIS — M545 Low back pain: Secondary | ICD-10-CM | POA: Diagnosis not present

## 2018-03-01 DIAGNOSIS — F411 Generalized anxiety disorder: Secondary | ICD-10-CM | POA: Diagnosis not present

## 2018-03-01 DIAGNOSIS — F4310 Post-traumatic stress disorder, unspecified: Secondary | ICD-10-CM | POA: Diagnosis not present

## 2018-03-02 ENCOUNTER — Ambulatory Visit: Payer: Self-pay | Admitting: General Surgery

## 2018-03-06 ENCOUNTER — Other Ambulatory Visit: Payer: Self-pay

## 2018-03-06 ENCOUNTER — Encounter (HOSPITAL_BASED_OUTPATIENT_CLINIC_OR_DEPARTMENT_OTHER): Payer: Self-pay | Admitting: *Deleted

## 2018-03-06 NOTE — Progress Notes (Addendum)
Spoke with patient via telephone for pre op interview. No medications AM of surgery. NPO after MN. Patient to arrive at 0530. Patient to bring CPAP machine with her.

## 2018-03-07 DIAGNOSIS — G43719 Chronic migraine without aura, intractable, without status migrainosus: Secondary | ICD-10-CM | POA: Diagnosis not present

## 2018-03-07 NOTE — Anesthesia Preprocedure Evaluation (Addendum)
Anesthesia Evaluation  Patient identified by MRN, date of birth, ID band Patient awake    Reviewed: Allergy & Precautions, NPO status , Patient's Chart, lab work & pertinent test results  Airway Mallampati: III  TM Distance: >3 FB Neck ROM: Full    Dental  (+) Dental Advisory Given   Pulmonary sleep apnea and Continuous Positive Airway Pressure Ventilation , former smoker,    breath sounds clear to auscultation       Cardiovascular hypertension, Pt. on medications  Rhythm:Regular Rate:Normal     Neuro/Psych  Headaches, Anxiety Depression    GI/Hepatic negative GI ROS, Neg liver ROS,   Endo/Other  Morbid obesity  Renal/GU negative Renal ROS     Musculoskeletal   Abdominal   Peds  Hematology negative hematology ROS (+)   Anesthesia Other Findings   Reproductive/Obstetrics                            Anesthesia Physical Anesthesia Plan  ASA: II  Anesthesia Plan: General   Post-op Pain Management:    Induction: Intravenous  PONV Risk Score and Plan: 3 and Midazolam, Dexamethasone, Ondansetron and Treatment may vary due to age or medical condition  Airway Management Planned: Oral ETT  Additional Equipment:   Intra-op Plan:   Post-operative Plan: Extubation in OR  Informed Consent: I have reviewed the patients History and Physical, chart, labs and discussed the procedure including the risks, benefits and alternatives for the proposed anesthesia with the patient or authorized representative who has indicated his/her understanding and acceptance.   Dental advisory given  Plan Discussed with: CRNA  Anesthesia Plan Comments:        Anesthesia Quick Evaluation

## 2018-03-08 ENCOUNTER — Ambulatory Visit (HOSPITAL_BASED_OUTPATIENT_CLINIC_OR_DEPARTMENT_OTHER): Payer: BLUE CROSS/BLUE SHIELD | Admitting: Anesthesiology

## 2018-03-08 ENCOUNTER — Encounter (HOSPITAL_BASED_OUTPATIENT_CLINIC_OR_DEPARTMENT_OTHER): Payer: Self-pay

## 2018-03-08 ENCOUNTER — Ambulatory Visit (HOSPITAL_BASED_OUTPATIENT_CLINIC_OR_DEPARTMENT_OTHER)
Admission: RE | Admit: 2018-03-08 | Discharge: 2018-03-08 | Disposition: A | Discharge status: Home / Self Care | Payer: BLUE CROSS/BLUE SHIELD | Source: Ambulatory Visit | Attending: General Surgery | Admitting: General Surgery

## 2018-03-08 ENCOUNTER — Encounter (HOSPITAL_BASED_OUTPATIENT_CLINIC_OR_DEPARTMENT_OTHER)
Admission: RE | Disposition: A | Discharge status: Home / Self Care | Payer: Self-pay | Source: Ambulatory Visit | Attending: General Surgery

## 2018-03-08 DIAGNOSIS — Z880 Allergy status to penicillin: Secondary | ICD-10-CM | POA: Diagnosis not present

## 2018-03-08 DIAGNOSIS — F329 Major depressive disorder, single episode, unspecified: Secondary | ICD-10-CM | POA: Insufficient documentation

## 2018-03-08 DIAGNOSIS — G43909 Migraine, unspecified, not intractable, without status migrainosus: Secondary | ICD-10-CM | POA: Diagnosis not present

## 2018-03-08 DIAGNOSIS — K603 Anal fistula: Secondary | ICD-10-CM | POA: Diagnosis not present

## 2018-03-08 DIAGNOSIS — Z6841 Body Mass Index (BMI) 40.0 and over, adult: Secondary | ICD-10-CM | POA: Diagnosis not present

## 2018-03-08 DIAGNOSIS — Z809 Family history of malignant neoplasm, unspecified: Secondary | ICD-10-CM | POA: Diagnosis not present

## 2018-03-08 DIAGNOSIS — Z90710 Acquired absence of both cervix and uterus: Secondary | ICD-10-CM | POA: Diagnosis not present

## 2018-03-08 DIAGNOSIS — Z8249 Family history of ischemic heart disease and other diseases of the circulatory system: Secondary | ICD-10-CM | POA: Insufficient documentation

## 2018-03-08 DIAGNOSIS — G4730 Sleep apnea, unspecified: Secondary | ICD-10-CM | POA: Insufficient documentation

## 2018-03-08 DIAGNOSIS — Z853 Personal history of malignant neoplasm of breast: Secondary | ICD-10-CM | POA: Diagnosis not present

## 2018-03-08 DIAGNOSIS — I10 Essential (primary) hypertension: Secondary | ICD-10-CM | POA: Diagnosis not present

## 2018-03-08 DIAGNOSIS — Z91012 Allergy to eggs: Secondary | ICD-10-CM | POA: Insufficient documentation

## 2018-03-08 DIAGNOSIS — N63 Unspecified lump in unspecified breast: Secondary | ICD-10-CM | POA: Insufficient documentation

## 2018-03-08 DIAGNOSIS — F419 Anxiety disorder, unspecified: Secondary | ICD-10-CM | POA: Insufficient documentation

## 2018-03-08 DIAGNOSIS — Z882 Allergy status to sulfonamides status: Secondary | ICD-10-CM | POA: Diagnosis not present

## 2018-03-08 DIAGNOSIS — K649 Unspecified hemorrhoids: Secondary | ICD-10-CM | POA: Insufficient documentation

## 2018-03-08 DIAGNOSIS — Z833 Family history of diabetes mellitus: Secondary | ICD-10-CM | POA: Diagnosis not present

## 2018-03-08 HISTORY — PX: LIGATION OF INTERNAL FISTULA TRACT: SHX6551

## 2018-03-08 HISTORY — DX: Essential (primary) hypertension: I10

## 2018-03-08 SURGERY — LIGATION, INTERNAL FISTULA TRACT
Anesthesia: General

## 2018-03-08 MED ORDER — FENTANYL CITRATE (PF) 100 MCG/2ML IJ SOLN
25.0000 ug | INTRAMUSCULAR | Status: DC | PRN
  Filled 2018-03-08: qty 1

## 2018-03-08 MED ORDER — LIDOCAINE 2% (20 MG/ML) 5 ML SYRINGE
INTRAMUSCULAR | Status: DC | PRN
  Administered 2018-03-08: 60 mg via INTRAVENOUS

## 2018-03-08 MED ORDER — EPHEDRINE 5 MG/ML INJ
INTRAVENOUS | Status: AC
  Filled 2018-03-08: qty 10

## 2018-03-08 MED ORDER — ROCURONIUM BROMIDE 10 MG/ML (PF) SYRINGE
PREFILLED_SYRINGE | INTRAVENOUS | Status: DC | PRN
  Administered 2018-03-08: 25 mg via INTRAVENOUS

## 2018-03-08 MED ORDER — SODIUM CHLORIDE 0.9% FLUSH
3.0000 mL | INTRAVENOUS | Status: DC | PRN
  Filled 2018-03-08: qty 3

## 2018-03-08 MED ORDER — OXYCODONE HCL 5 MG PO TABS
5.0000 mg | ORAL_TABLET | ORAL | Status: DC | PRN
  Filled 2018-03-08: qty 2

## 2018-03-08 MED ORDER — FENTANYL CITRATE (PF) 100 MCG/2ML IJ SOLN
INTRAMUSCULAR | Status: AC
  Filled 2018-03-08: qty 2

## 2018-03-08 MED ORDER — DEXAMETHASONE SODIUM PHOSPHATE 10 MG/ML IJ SOLN
INTRAMUSCULAR | Status: AC
  Filled 2018-03-08: qty 2

## 2018-03-08 MED ORDER — PROPOFOL 10 MG/ML IV BOLUS
INTRAVENOUS | Status: DC | PRN
  Administered 2018-03-08: 200 mg via INTRAVENOUS

## 2018-03-08 MED ORDER — BUPIVACAINE-EPINEPHRINE 0.5% -1:200000 IJ SOLN
INTRAMUSCULAR | Status: DC | PRN
  Administered 2018-03-08: 50 mL

## 2018-03-08 MED ORDER — SUGAMMADEX SODIUM 500 MG/5ML IV SOLN
INTRAVENOUS | Status: DC | PRN
  Administered 2018-03-08: 400 mg via INTRAVENOUS

## 2018-03-08 MED ORDER — LIDOCAINE 5 % EX OINT
TOPICAL_OINTMENT | CUTANEOUS | Status: DC | PRN
  Administered 2018-03-08: 1

## 2018-03-08 MED ORDER — BUPIVACAINE LIPOSOME 1.3 % IJ SUSP
INTRAMUSCULAR | Status: DC | PRN
  Administered 2018-03-08: 20 mL

## 2018-03-08 MED ORDER — FENTANYL CITRATE (PF) 100 MCG/2ML IJ SOLN
INTRAMUSCULAR | Status: DC | PRN
  Administered 2018-03-08 (×2): 50 ug via INTRAVENOUS

## 2018-03-08 MED ORDER — BUPIVACAINE LIPOSOME 1.3 % IJ SUSP
20.0000 mL | Freq: Once | INTRAMUSCULAR | Status: DC
  Filled 2018-03-08: qty 20

## 2018-03-08 MED ORDER — ACETAMINOPHEN 325 MG PO TABS
650.0000 mg | ORAL_TABLET | ORAL | Status: DC | PRN
  Filled 2018-03-08: qty 2

## 2018-03-08 MED ORDER — MIDAZOLAM HCL 2 MG/2ML IJ SOLN
INTRAMUSCULAR | Status: AC
  Filled 2018-03-08: qty 2

## 2018-03-08 MED ORDER — DEXAMETHASONE SODIUM PHOSPHATE 10 MG/ML IJ SOLN
INTRAMUSCULAR | Status: DC | PRN
  Administered 2018-03-08: 10 mg via INTRAVENOUS

## 2018-03-08 MED ORDER — SODIUM CHLORIDE 0.9% FLUSH
3.0000 mL | Freq: Two times a day (BID) | INTRAVENOUS | Status: DC
  Filled 2018-03-08: qty 3

## 2018-03-08 MED ORDER — LIDOCAINE 2% (20 MG/ML) 5 ML SYRINGE
INTRAMUSCULAR | Status: AC
  Filled 2018-03-08: qty 10

## 2018-03-08 MED ORDER — LACTATED RINGERS IV SOLN
INTRAVENOUS | Status: DC
  Administered 2018-03-08: 06:00:00 via INTRAVENOUS
  Filled 2018-03-08: qty 1000

## 2018-03-08 MED ORDER — ACETAMINOPHEN 500 MG PO TABS
1000.0000 mg | ORAL_TABLET | ORAL | Status: AC
  Administered 2018-03-08: 1000 mg via ORAL
  Filled 2018-03-08: qty 2

## 2018-03-08 MED ORDER — ACETAMINOPHEN 500 MG PO TABS
ORAL_TABLET | ORAL | Status: AC
  Filled 2018-03-08: qty 2

## 2018-03-08 MED ORDER — MIDAZOLAM HCL 2 MG/2ML IJ SOLN
INTRAMUSCULAR | Status: DC | PRN
  Administered 2018-03-08: 2 mg via INTRAVENOUS

## 2018-03-08 MED ORDER — ONDANSETRON HCL 4 MG/2ML IJ SOLN
INTRAMUSCULAR | Status: AC
  Filled 2018-03-08: qty 4

## 2018-03-08 MED ORDER — SODIUM CHLORIDE 0.9 % IV SOLN
250.0000 mL | INTRAVENOUS | Status: DC | PRN
  Filled 2018-03-08: qty 250

## 2018-03-08 MED ORDER — HYDROGEN PEROXIDE 3 % EX SOLN
CUTANEOUS | Status: DC | PRN
  Administered 2018-03-08: 1

## 2018-03-08 MED ORDER — PROPOFOL 10 MG/ML IV BOLUS
INTRAVENOUS | Status: AC
  Filled 2018-03-08: qty 60

## 2018-03-08 MED ORDER — ONDANSETRON HCL 4 MG/2ML IJ SOLN
INTRAMUSCULAR | Status: DC | PRN
  Administered 2018-03-08: 4 mg via INTRAVENOUS

## 2018-03-08 MED ORDER — ROCURONIUM BROMIDE 10 MG/ML (PF) SYRINGE
PREFILLED_SYRINGE | INTRAVENOUS | Status: AC
  Filled 2018-03-08: qty 20

## 2018-03-08 MED ORDER — ONDANSETRON HCL 4 MG/2ML IJ SOLN
4.0000 mg | Freq: Once | INTRAMUSCULAR | Status: DC | PRN
  Filled 2018-03-08: qty 2

## 2018-03-08 MED ORDER — SUGAMMADEX SODIUM 500 MG/5ML IV SOLN
INTRAVENOUS | Status: AC
  Filled 2018-03-08: qty 5

## 2018-03-08 MED ORDER — GABAPENTIN 300 MG PO CAPS
300.0000 mg | ORAL_CAPSULE | ORAL | Status: AC
  Administered 2018-03-08: 300 mg via ORAL
  Filled 2018-03-08: qty 1

## 2018-03-08 MED ORDER — GABAPENTIN 300 MG PO CAPS
ORAL_CAPSULE | ORAL | Status: AC
  Filled 2018-03-08: qty 1

## 2018-03-08 MED ORDER — PHENYLEPHRINE 40 MCG/ML (10ML) SYRINGE FOR IV PUSH (FOR BLOOD PRESSURE SUPPORT)
PREFILLED_SYRINGE | INTRAVENOUS | Status: AC
  Filled 2018-03-08: qty 10

## 2018-03-08 MED ORDER — ACETAMINOPHEN 650 MG RE SUPP
650.0000 mg | RECTAL | Status: DC | PRN
  Filled 2018-03-08: qty 1

## 2018-03-08 MED ORDER — SUCCINYLCHOLINE CHLORIDE 200 MG/10ML IV SOSY
PREFILLED_SYRINGE | INTRAVENOUS | Status: DC | PRN
  Administered 2018-03-08: 100 mg via INTRAVENOUS

## 2018-03-08 SURGICAL SUPPLY — 46 items
BENZOIN TINCTURE PRP APPL 2/3 (GAUZE/BANDAGES/DRESSINGS) ×4 IMPLANT
BLADE EXTENDED COATED 6.5IN (ELECTRODE) IMPLANT
BLADE HEX COATED 2.75 (ELECTRODE) ×2 IMPLANT
BLADE SURG 10 STRL SS (BLADE) ×2 IMPLANT
BRIEF STRETCH FOR OB PAD LRG (UNDERPADS AND DIAPERS) ×2 IMPLANT
CANISTER SUCT 3000ML PPV (MISCELLANEOUS) ×2 IMPLANT
COVER BACK TABLE 60X90IN (DRAPES) ×2 IMPLANT
COVER MAYO STAND STRL (DRAPES) ×2 IMPLANT
DRAPE LAPAROTOMY 100X72 PEDS (DRAPES) ×2 IMPLANT
DRAPE UTILITY XL STRL (DRAPES) ×2 IMPLANT
ELECT REM PT RETURN 9FT ADLT (ELECTROSURGICAL) ×2
ELECTRODE REM PT RTRN 9FT ADLT (ELECTROSURGICAL) ×1 IMPLANT
GAUZE SPONGE 4X4 12PLY STRL (GAUZE/BANDAGES/DRESSINGS) ×2 IMPLANT
GLOVE BIO SURGEON STRL SZ 6.5 (GLOVE) ×2 IMPLANT
GLOVE BIOGEL PI IND STRL 7.0 (GLOVE) ×1 IMPLANT
GLOVE BIOGEL PI INDICATOR 7.0 (GLOVE) ×1
GOWN SPEC L3 XXLG W/TWL (GOWN DISPOSABLE) ×2 IMPLANT
HYDROGEN PEROXIDE 16OZ (MISCELLANEOUS) ×2 IMPLANT
IV CATH 18G SAFETY (IV SOLUTION) ×2 IMPLANT
KIT TURNOVER CYSTO (KITS) ×2 IMPLANT
NEEDLE HYPO 22GX1.5 SAFETY (NEEDLE) ×2 IMPLANT
NS IRRIG 500ML POUR BTL (IV SOLUTION) ×2 IMPLANT
PACK BASIN DAY SURGERY FS (CUSTOM PROCEDURE TRAY) ×2 IMPLANT
PAD ABD 8X10 STRL (GAUZE/BANDAGES/DRESSINGS) ×2 IMPLANT
PAD ARMBOARD 7.5X6 YLW CONV (MISCELLANEOUS) IMPLANT
PENCIL BUTTON HOLSTER BLD 10FT (ELECTRODE) ×2 IMPLANT
RETRACTOR STAY HOOK 5MM (MISCELLANEOUS) ×2 IMPLANT
RETRACTOR STERILE 25.8CMX11.3 (INSTRUMENTS) ×2 IMPLANT
SPONGE SURGIFOAM ABS GEL 12-7 (HEMOSTASIS) IMPLANT
SUCTION FRAZIER HANDLE 10FR (MISCELLANEOUS) ×1
SUCTION TUBE FRAZIER 10FR DISP (MISCELLANEOUS) ×1 IMPLANT
SUT CHROMIC 2 0 SH (SUTURE) IMPLANT
SUT CHROMIC 3 0 SH 27 (SUTURE) IMPLANT
SUT SILK 2 0 (SUTURE) ×1
SUT SILK 2-0 18XBRD TIE 12 (SUTURE) ×1 IMPLANT
SUT SILK 3 0 SH 30 (SUTURE) IMPLANT
SUT VIC AB 2-0 SH 27 (SUTURE)
SUT VIC AB 2-0 SH 27XBRD (SUTURE) IMPLANT
SUT VIC AB 3-0 SH 8-18 (SUTURE) ×2 IMPLANT
SUT VICRYL 3 0 UR 6 27 (SUTURE) IMPLANT
SYR BULB IRRIGATION 50ML (SYRINGE) ×2 IMPLANT
SYR CONTROL 10ML LL (SYRINGE) ×2 IMPLANT
TOWEL OR 17X24 6PK STRL BLUE (TOWEL DISPOSABLE) ×2 IMPLANT
TRAY DSU PREP LF (CUSTOM PROCEDURE TRAY) ×2 IMPLANT
TUBE CONNECTING 12X1/4 (SUCTIONS) ×2 IMPLANT
YANKAUER SUCT BULB TIP NO VENT (SUCTIONS) ×2 IMPLANT

## 2018-03-08 NOTE — Anesthesia Procedure Notes (Signed)
Procedure Name: Intubation Date/Time: 03/08/2018 7:43 AM Performed by: Cynda Familia, CRNA Pre-anesthesia Checklist: Patient identified, Emergency Drugs available, Suction available and Patient being monitored Patient Re-evaluated:Patient Re-evaluated prior to induction Oxygen Delivery Method: Circle System Utilized Preoxygenation: Pre-oxygenation with 100% oxygen Induction Type: IV induction Ventilation: Mask ventilation without difficulty Laryngoscope Size: Miller and 2 Grade View: Grade I Tube type: Oral Number of attempts: 1 Airway Equipment and Method: Stylet Placement Confirmation: ETT inserted through vocal cords under direct vision,  positive ETCO2 and breath sounds checked- equal and bilateral Secured at: 22 cm Tube secured with: Tape Dental Injury: Teeth and Oropharynx as per pre-operative assessment  Comments: Smooth IV induction Fitzgerald-- intubation AM CRNA atraumatic-- teeth and mouth as preop-- bilat BS Ola Spurr

## 2018-03-08 NOTE — Op Note (Signed)
03/08/2018  8:20 AM  PATIENT:  [name]  47 y.o. female  Patient Care Team: System, Pcp Not In as PCP - General  PRE-OPERATIVE DIAGNOSIS:  ANAL FISTULA  POST-OPERATIVE DIAGNOSIS:  ANAL FISTULA  PROCEDURE:  LIGATION OF INTERNAL FISTULA TRACT    Surgeon(s): Leighton Ruff, MD  ASSISTANT: none   ANESTHESIA:   local and general  SPECIMEN:  No Specimen  DISPOSITION OF SPECIMEN:  N/A  COUNTS:  YES  PLAN OF CARE: Discharge to home after PACU  PATIENT DISPOSITION:  PACU - hemodynamically stable.  INDICATION: 47 year old female with a anal fistula status post seton placement earlier this year.   OR FINDINGS: Delphina Cahill was pulled back approximately 3 mm from original opening involving minimal internal sphincter.  DESCRIPTION: the patient was identified in the preoperative holding area and taken to the OR where they were laid on the operating room table.  General anesthesia was induced without difficulty. The patient was then positioned in prone jackknife position with buttocks gently taped apart.  The patient was then prepped and draped in usual sterile fashion.  SCDs were noted to be in place prior to the initiation of anesthesia. A surgical timeout was performed indicating the correct patient, procedure, positioning and need for preoperative antibiotics.  A rectal block was performed using Marcaine with epinephrine mixed with Exparel.    I began with a digital rectal exam.  The seton could be palpated approximately 3 mm from the previous internal opening.  I then placed a Hill-Ferguson anoscope into the anal canal and evaluated this completely.  The patient had grade 1 internal hemorrhoids and good sphincter tone.  I began by placing a S-shaped fistula probe into the fistula and removing the seton.  I then made an incision over the intersphincteric groove using a 10 blade scalpel.  This was carried down the side the fistula tract on either side separating the external  sphincter from the surrounding tissues.  I then used a right angle to dissect underneath of the fistula tract.  Once I was able to get circumferentially around the fistula tract, a 2-0 silk suture was placed around the tract.  I elongated the posterior portion of the tract and placed another 2-0 silk suture.  The fistula probe was removed and the sutures were tied tightly.  The fistula tract was ligated with a 10 blade scalpel.  Additional 3-0 sutures were used to close the internal opening.  I also reinforced the silk suture on the external portion of the fistula using a figure-of-eight suture.  I then injected hydrogen peroxide into the external opening and found a small leak in the medial portion of the wound.  Second figure-of-eight 3-0 Vicryl suture was placed and no more leak was noted.  I then used two 3-0 Vicryl sutures to close the cavity and then closed the skin with 2-0 chromic interrupted sutures.  I enlarged the external opening using Bovie electrocautery.  Lidocaine ointment and sterile dressing were applied.  The patient was awakened from anesthesia and sent to the postanesthesia care unit stable condition.  All counts were correct per operating room staff.

## 2018-03-08 NOTE — Transfer of Care (Signed)
Immediate Anesthesia Transfer of Care Note  Patient: [name] [facility]  Procedure(s) Performed: LIGATION OF INTERNAL FISTULA TRACT (N/A )  Patient Location: PACU  Anesthesia Type:General  Level of Consciousness: awake and alert   Airway & Oxygen Therapy: Patient Spontanous Breathing and Patient connected to face mask oxygen  Post-op Assessment: Report given to RN and Post -op Vital signs reviewed and stable  Post vital signs: Reviewed and stable  Last Vitals:  Vitals Value Taken Time  BP    Temp    Pulse    Resp    SpO2      Last Pain:  Vitals:   03/08/18 0930  TempSrc:   PainSc: 0-No pain      Patients Stated Pain Goal: 8 (49/20/10 0712)  Complications: No apparent anesthesia complications

## 2018-03-08 NOTE — Anesthesia Procedure Notes (Signed)
Date/Time: 03/08/2018 8:28 AM Performed by: Cynda Familia, CRNA Oxygen Delivery Method: Simple face mask Airway Equipment and Method: Oral airway Placement Confirmation: positive ETCO2 and breath sounds checked- equal and bilateral

## 2018-03-08 NOTE — H&P (Signed)
  The patient is a 47 year old female presenting for a post-operative visit. 47 year old female status post seton placement with February 2019. She is ready for her definitive repair.  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 Hysterectomy (due to cancer) - Complete Oral Surgery  Diagnostic Studies History Colonoscopy 1-5 years ago Mammogram within last year Pap Smear 1-5 years ago  Allergies  Amoxicillin *PENICILLINS* Penicillin G Sodium *PENICILLINS* Sulfa 10 *OPHTHALMIC AGENTS* Egg/Pro *DIETARY PRODUCTS/DIETARY MANAGEMENT PRODUCTS*  Medication History  amLODIPine Besylate (5MG  Tablet, Oral) Active. Meloxicam (7.5MG  Tablet, Oral) Active. Baclofen (10MG  Tablet, Oral) Active. ChlorproMAZINE HCl (25MG  Tablet, Oral) Active. Desvenlafaxine Succinate ER (100MG  Tablet ER 24HR, Oral) Active. Tylenol (325MG  Tablet, Oral) Active. Pristiq (50MG  Tablet ER 24HR, Oral) Active. Vyvanse (30MG  Capsule, Oral) Active. Divigel (1MG /GM Gel, Transdermal) Active. Vitamin D (Cholecalciferol) (1000UNIT Tablet, Oral) Active. Multiple Vitamin (Oral) Active. Medications Reconciled  Social History  Alcohol use Occasional alcohol use. Caffeine use Carbonated beverages. No drug use Tobacco use Former smoker.  Family History  Cancer Father. Diabetes Mellitus Father. Hypertension Mother.  Pregnancy / Birth History  Age at menarche 57 years. Contraceptive History Oral contraceptives. Gravida 1 Maternal age 47-40 Para 1 Regular periods  Other Problems  Cervical Cancer Hemorrhoids Inguinal Hernia Lump In Breast Migraine Headache Oophorectomy Bilateral.   Review of Systems  General Present- Weight Loss (Intentional). Not Present- Appetite Loss, Chills, Fatigue,  Fever, Night Sweats and Weight Gain. HEENT Present- Wears glasses/contact lenses. Not Present- Earache, Hearing Loss, Hoarseness, Nose Bleed, Oral Ulcers, Ringing in the Ears, Seasonal Allergies, Sinus Pain, Sore Throat, Visual Disturbances and Yellow Eyes. Respiratory Not Present- Chronic Cough, Difficulty Breathing and Sputum Production. Cardiovascular Present- Elevated Blood Pressure. Not Present- Chest Pain. Gastrointestinal Present- Rectal Pain. Not Present- Abdominal Pain, Bloating, Bloody Stool, Change in Bowel Habits, Chronic diarrhea, Constipation, Difficulty Swallowing, Excessive gas, Gets full quickly at meals, Hemorrhoids, Indigestion, Nausea and Vomiting.  BP (!) 143/87   Pulse 84   Temp 98.5 F (36.9 C) (Oral)   Resp 18   Ht 5' 4.5" (1.638 m)   Wt 108.9 kg   SpO2 96%   BMI 40.56 kg/m    Physical Exam   The physical exam findings are as follows: Note:GENERAL: Well-developed, well nourished female - seems anxious  RESPIRATORY: Normal effort, no use of accessory muscles  MUSCULOSKELETAL: Normal gait Grossly normal ROM upper extremities Grossly normal ROM lower extremities  SKIN: Warm and dry Not diaphoretic  PSYCHIATRIC: Normal judgement and insight Normal mood and affect Alert, oriented x 3  Rectal Note: Superficial opening on left lateral perianal region, about 3 cm from anal verge There is no remaining cellulitis or induration, no obvious fluctuance  Seton in place, LA perianal region    Assessment & Plan   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 She is now ready to proceed with her secondary procedure. We will plan on performing a ligation of internal fistula tract. We have discussed this in detail including the 20% recurrence rate. I believe she understands this and wishes to proceed with surgery.

## 2018-03-08 NOTE — Anesthesia Postprocedure Evaluation (Signed)
Anesthesia Post Note  Patient: [name] [facility]  Procedure(s) Performed: LIGATION OF INTERNAL FISTULA TRACT (N/A )     Patient location during evaluation: PACU Anesthesia Type: General Level of consciousness: awake and alert Pain management: pain level controlled Vital Signs Assessment: post-procedure vital signs reviewed and stable Respiratory status: spontaneous breathing, nonlabored ventilation, respiratory function stable and patient connected to nasal cannula oxygen Cardiovascular status: blood pressure returned to baseline and stable Postop Assessment: no apparent nausea or vomiting Anesthetic complications: no    Last Vitals:  Vitals:   03/08/18 0915 03/08/18 0930  BP: (!) 174/104 (!) 151/90  Pulse: 95 86  Resp: 18 17  Temp:    SpO2: 93% 96%    Last Pain:  Vitals:   03/08/18 0930  TempSrc:   PainSc: 0-No pain                 Tiajuana Amass

## 2018-03-08 NOTE — Discharge Instructions (Addendum)

## 2018-03-09 ENCOUNTER — Encounter (HOSPITAL_BASED_OUTPATIENT_CLINIC_OR_DEPARTMENT_OTHER): Payer: Self-pay | Admitting: General Surgery

## 2018-03-28 DIAGNOSIS — G43719 Chronic migraine without aura, intractable, without status migrainosus: Secondary | ICD-10-CM | POA: Diagnosis not present

## 2018-03-28 DIAGNOSIS — G518 Other disorders of facial nerve: Secondary | ICD-10-CM | POA: Diagnosis not present

## 2018-03-28 DIAGNOSIS — M542 Cervicalgia: Secondary | ICD-10-CM | POA: Diagnosis not present

## 2018-03-28 DIAGNOSIS — M791 Myalgia, unspecified site: Secondary | ICD-10-CM | POA: Diagnosis not present

## 2018-03-29 DIAGNOSIS — F4310 Post-traumatic stress disorder, unspecified: Secondary | ICD-10-CM | POA: Diagnosis not present

## 2018-03-29 DIAGNOSIS — F331 Major depressive disorder, recurrent, moderate: Secondary | ICD-10-CM | POA: Diagnosis not present

## 2018-04-02 DIAGNOSIS — G4733 Obstructive sleep apnea (adult) (pediatric): Secondary | ICD-10-CM | POA: Diagnosis not present

## 2018-04-02 DIAGNOSIS — G4710 Hypersomnia, unspecified: Secondary | ICD-10-CM | POA: Diagnosis not present

## 2018-04-05 DIAGNOSIS — F331 Major depressive disorder, recurrent, moderate: Secondary | ICD-10-CM | POA: Diagnosis not present

## 2018-04-05 DIAGNOSIS — F509 Eating disorder, unspecified: Secondary | ICD-10-CM | POA: Diagnosis not present

## 2018-04-17 DIAGNOSIS — G4710 Hypersomnia, unspecified: Secondary | ICD-10-CM | POA: Diagnosis not present

## 2018-04-17 DIAGNOSIS — G4733 Obstructive sleep apnea (adult) (pediatric): Secondary | ICD-10-CM | POA: Diagnosis not present

## 2018-04-19 DIAGNOSIS — F5081 Binge eating disorder: Secondary | ICD-10-CM | POA: Diagnosis not present

## 2018-04-19 DIAGNOSIS — F411 Generalized anxiety disorder: Secondary | ICD-10-CM | POA: Diagnosis not present

## 2018-04-26 DIAGNOSIS — F5081 Binge eating disorder: Secondary | ICD-10-CM | POA: Diagnosis not present

## 2018-04-30 DIAGNOSIS — M8588 Other specified disorders of bone density and structure, other site: Secondary | ICD-10-CM | POA: Diagnosis not present

## 2018-05-03 DIAGNOSIS — F5081 Binge eating disorder: Secondary | ICD-10-CM | POA: Diagnosis not present

## 2018-05-10 DIAGNOSIS — S52501D Unspecified fracture of the lower end of right radius, subsequent encounter for closed fracture with routine healing: Secondary | ICD-10-CM | POA: Diagnosis not present

## 2018-05-10 DIAGNOSIS — M25562 Pain in left knee: Secondary | ICD-10-CM | POA: Diagnosis not present

## 2018-05-17 DIAGNOSIS — F5081 Binge eating disorder: Secondary | ICD-10-CM | POA: Diagnosis not present

## 2018-05-21 DIAGNOSIS — C539 Malignant neoplasm of cervix uteri, unspecified: Secondary | ICD-10-CM | POA: Diagnosis not present

## 2018-05-21 DIAGNOSIS — M25562 Pain in left knee: Secondary | ICD-10-CM | POA: Diagnosis not present

## 2018-05-23 ENCOUNTER — Encounter: Payer: Self-pay | Admitting: Obstetrics and Gynecology

## 2018-05-23 ENCOUNTER — Ambulatory Visit: Payer: BLUE CROSS/BLUE SHIELD | Admitting: Obstetrics and Gynecology

## 2018-05-23 ENCOUNTER — Other Ambulatory Visit: Payer: Self-pay

## 2018-05-23 VITALS — BP 148/92 | HR 80 | Resp 16 | Ht 64.5 in | Wt 244.0 lb

## 2018-05-23 DIAGNOSIS — M2242 Chondromalacia patellae, left knee: Secondary | ICD-10-CM | POA: Diagnosis not present

## 2018-05-23 DIAGNOSIS — G4700 Insomnia, unspecified: Secondary | ICD-10-CM | POA: Diagnosis not present

## 2018-05-23 DIAGNOSIS — Z719 Counseling, unspecified: Secondary | ICD-10-CM | POA: Diagnosis not present

## 2018-05-23 DIAGNOSIS — M1712 Unilateral primary osteoarthritis, left knee: Secondary | ICD-10-CM | POA: Diagnosis not present

## 2018-05-23 DIAGNOSIS — Z6841 Body Mass Index (BMI) 40.0 and over, adult: Secondary | ICD-10-CM

## 2018-05-23 DIAGNOSIS — M9901 Segmental and somatic dysfunction of cervical region: Secondary | ICD-10-CM | POA: Diagnosis not present

## 2018-05-23 DIAGNOSIS — M9902 Segmental and somatic dysfunction of thoracic region: Secondary | ICD-10-CM | POA: Diagnosis not present

## 2018-05-23 DIAGNOSIS — R51 Headache: Secondary | ICD-10-CM | POA: Diagnosis not present

## 2018-05-23 DIAGNOSIS — M9903 Segmental and somatic dysfunction of lumbar region: Secondary | ICD-10-CM | POA: Diagnosis not present

## 2018-05-23 DIAGNOSIS — M25562 Pain in left knee: Secondary | ICD-10-CM | POA: Diagnosis not present

## 2018-05-23 NOTE — Progress Notes (Signed)
48 y.o. G89P0001 Single Caucasian female here to establish care.   Patient complains of fatigue and insomnia and wants to know if this could be hormonal.   States she is tired and never feels good and just wants help.   Not sleeping well.  States she is overweight.  Has sleep apnea. States she is having a problem with her mask.  Wakes up even if she takes OTC meds to help her sleep.  Dr. Halford Chessman managing.   Daughter, 2 dogs, and cat sleep in the bed with patient.   Suffers from migraines.  Sees Dr. Domingo Cocking for management.  Does dry needling.   Is now 5 years post tx for cervical cancer.  Had a radical hysterectomy/BSO, chemotherapy and radiation.  She will follow up with Dr. Georgina Peer for her paps.   The last time she took hormone therapy was years ago, at least 3 years ago.  Her contact was for a very short period of time.  She had difficulty with transdermal use due to sweating.  No drenched sheets at night.   States she has IBS, colitis, and rectal abscesses.  Has depression and anxiety.  Takes [place]. Pauline Good providing care.   PCP:  Renee Ramus, PA-C   No LMP recorded. Patient has had a hysterectomy.           Sexually active: No.  The current method of family planning is status post hysterectomy.    Exercising: No.  The patient does not participate in regular exercise at present. Smoker:  no  Health Maintenance: Pap:  05-21-2018 Neg vaginal pap--[facility] History of abnormal Pap:  Yes, Hx of cervical cancer 2014 MMG: 05-17-17 Neg/density C/BiRads1--[facility] Colonoscopy: 03-14-13 for diarrhea--neg;next 10 years.  Also had one last year.  Showed colitis.  BMD: 04-30-18  Result  Osteopenia--[facility] TDaP:  Unsure Gardasil:   no HIV:Neg Hep C:Neg Screening Labs:  ---   reports that she quit smoking about 20 years ago. She has never used smokeless tobacco. She reports that she does not drink alcohol or use drugs.  Past Medical History:  Diagnosis Date  . 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 cervical ca  . Anal fistula   . Anxiety   . Cervical cancer [facility]) current oncologist -- dr Durwin Reges ([facility])/  previously dr Rayford Halsted [facility]   dx 12/ 2014 invasive cervical cancer--- Stage IB2--- 06-11-2013  s/p  TAH w/ BSO and pelvic node dissection,  completed chemoradiation 04/ 2015  . Depression   . Endometriosis   . Fibroid   . Hemorrhoids   . History of cancer chemotherapy    FOR CERVICAL CANCER , COMPLETED 08-2013  . History of radiation therapy    FOR CERVICAL CANCER , COMPLETED 08-2013  . Hypercholesteremia   . Hypertension   . IBS (irritable bowel syndrome)   . Migraines   . Nodule of upper lobe of left lung    last CT chest 05-15-2017 (care everywhere in epic)   . OSA on CPAP   . 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 Urinary incontinence     Past Surgical History:  Procedure Laterality Date  . ABDOMINAL HYSTERECTOMY  06/11/2013   TAH/BSO  . BREAST SURGERY Right 11/25/2008   2 benign lumps  . COLONOSCOPY    . COLPOSCOPY    . EVALUATION UNDER ANESTHESIA WITH ANAL FISTULECTOMY N/A 07/06/2017   Procedure: ANAL EXAM  UNDER ANESTHESIA;  Surgeon: Leighton Ruff, MD;  [facility]: [facility];  Service: General;  Laterality: N/A;  . INCISION AND DRAINAGE PERIRECTAL ABSCESS N/A 04/26/2017   Procedure: IRRIGATION AND DEBRIDEMENT PERIRECTAL ABSCESS;  Surgeon: Johnathan Hausen, MD;  Location: [facility];  Service: General;  Laterality: N/A;  . LAPAROSCOPY INCISIONAL HERNIA REPAIR W/ MESH, LYSIS ADHESIONS  01-10-2014   @[facility]   . LIGATION OF INTERNAL FISTULA TRACT N/A 03/08/2018   Procedure: LIGATION OF INTERNAL FISTULA TRACT;  Surgeon: Leighton Ruff, MD;  Location: [place];  Service: General;  Laterality: N/A;  . [place] N/A 07/06/2017   Procedure: PLACEMENT OF SETON;  Surgeon: Leighton Ruff, MD;  [facility];  Service: General;   Laterality: N/A;  . TOTAL ABDOMINAL HYSTERECTOMY W/ BILATERAL SALPINGOOPHORECTOMY  06-11-2013    dr Legrand Como kelly @[facility]    W/ [place]  . WISDOM TOOTH EXTRACTION      Current Outpatient Medications  Medication Sig Dispense Refill  . amLODipine (NORVASC) 5 MG tablet Take 5 mg by mouth daily.    Marland Kitchen aspirin-acetaminophen-caffeine (EXCEDRIN MIGRAINE) 250-250-65 MG tablet Take 3 tablets by mouth every 6 (six) hours as needed for headache.    Marland Kitchen atorvastatin (LIPITOR) 10 MG tablet Take 10 mg by mouth daily at 6 PM.    . baclofen (LIORESAL) 10 MG tablet Take 1 tablet by mouth as needed.    . chlorproMAZINE (THORAZINE) 25 MG tablet Take 1 tablet by mouth as needed.    . cholecalciferol (VITAMIN D) 1000 UNITS tablet Take 1,000 Units by mouth daily.    . diphenhydramine-acetaminophen (TYLENOL PM) 25-500 MG TABS tablet Take 2 tablets by mouth at bedtime as needed (for sleep/pain).    Marland Kitchen desvenlafaxine (PRISTIQ) 100 MG 24 hr tablet Take 1 tablet by mouth daily.  1  . Multiple Vitamin (MULTIVITAMIN WITH MINERALS) TABS tablet Take 1 tablet by mouth daily.     No current facility-administered medications for this visit.     Family History  Problem Relation Age of Onset  . Hyperlipidemia Mother   . Cancer Father 66       bladder ca  . Diabetes Father   . Diabetes Maternal Grandfather   . Stroke Maternal Grandfather     Review of Systems  Constitutional: Positive for fatigue.  Psychiatric/Behavioral: Positive for sleep disturbance.       Insomnia  All other systems reviewed and are negative.   Exam:   BP (!) 148/92 (BP Location: Right Arm, Patient Position: Sitting, Cuff Size: Large)   Pulse 80   Resp 16   Ht 5' 4.5" (1.638 m)   Wt 244 lb (110.7 kg)   BMI 41.24 kg/m     General appearance: alert, cooperative and appears stated age  Assessment:     Hx cervical cancer.  Poor sleep quality.  Multifactorial. Sleep apnea with current mechanical issues of her device.   Hx depression and anxiety.  Hx IBS and colitis.  BMI 41.24.   Plan:    She has regular follow up with her GYN Oncologist. We talked about the risks and benefits of HRT.  I do not recommend this as she has been off HRT for a prolonged period of time.  I recommended she transition her daughter to her own bedroom and remove her pets from her sleeping environment.  She will contact the company who provides her CPAP machine and see her pulmonologist if she continues to have issues with her  sleep.  I briefly discussed Neurontin for night sweats.  No Rx given.  We discussed weight loss through an organized physician based program like [facility].  She will follow up in 2 months to reassess her progress in her health care.   After visit summary provided.   __60_____ minutes face to face time of which over 50% was spent in counseling.

## 2018-05-24 DIAGNOSIS — F5081 Binge eating disorder: Secondary | ICD-10-CM | POA: Diagnosis not present

## 2018-05-24 DIAGNOSIS — F331 Major depressive disorder, recurrent, moderate: Secondary | ICD-10-CM | POA: Diagnosis not present

## 2018-05-28 DIAGNOSIS — M1712 Unilateral primary osteoarthritis, left knee: Secondary | ICD-10-CM | POA: Diagnosis not present

## 2018-05-29 DIAGNOSIS — M9903 Segmental and somatic dysfunction of lumbar region: Secondary | ICD-10-CM | POA: Diagnosis not present

## 2018-05-29 DIAGNOSIS — M9901 Segmental and somatic dysfunction of cervical region: Secondary | ICD-10-CM | POA: Diagnosis not present

## 2018-05-29 DIAGNOSIS — M9902 Segmental and somatic dysfunction of thoracic region: Secondary | ICD-10-CM | POA: Diagnosis not present

## 2018-05-29 DIAGNOSIS — R51 Headache: Secondary | ICD-10-CM | POA: Diagnosis not present

## 2018-05-30 DIAGNOSIS — M1712 Unilateral primary osteoarthritis, left knee: Secondary | ICD-10-CM | POA: Diagnosis not present

## 2018-05-30 DIAGNOSIS — M2242 Chondromalacia patellae, left knee: Secondary | ICD-10-CM | POA: Diagnosis not present

## 2018-05-30 DIAGNOSIS — Z1231 Encounter for screening mammogram for malignant neoplasm of breast: Secondary | ICD-10-CM | POA: Diagnosis not present

## 2018-05-30 DIAGNOSIS — M25562 Pain in left knee: Secondary | ICD-10-CM | POA: Diagnosis not present

## 2018-05-31 DIAGNOSIS — F5081 Binge eating disorder: Secondary | ICD-10-CM | POA: Diagnosis not present

## 2018-06-05 ENCOUNTER — Telehealth: Payer: Self-pay | Admitting: Internal Medicine

## 2018-06-05 ENCOUNTER — Ambulatory Visit: Payer: Self-pay | Admitting: Internal Medicine

## 2018-06-05 DIAGNOSIS — Z0289 Encounter for other administrative examinations: Secondary | ICD-10-CM

## 2018-06-05 DIAGNOSIS — M170 Bilateral primary osteoarthritis of knee: Secondary | ICD-10-CM | POA: Diagnosis not present

## 2018-06-05 NOTE — Telephone Encounter (Signed)
Patient no showed today's appt. Please advise on how to follow up. °A. No follow up necessary. °B. Follow up urgent. Contact patient immediately. °C. Follow up necessary. Contact patient and schedule visit in ___ days. °D. Follow up advised. Contact patient and schedule visit in ____weeks. ° °Would you like the NS fee to be applied to this visit? ° °

## 2018-06-05 NOTE — Progress Notes (Deleted)
Patient ID: [name], female   DOB: Jan 06, 1971, 48 y.o.   MRN: [id number]   HPI: [name] is a 48 y.o. female, initially referred by Dr Kingsley Callander, returning for follow-up for surgical premature ovarian failure, vitamin D insufficiency.  Last visit 9 months ago.  Of note, she had a radial fracture 07/2017.  This is healed.  Of note, she does not have history of osteoporosis.  Reviewed latest bone density from 04/22/2016: LUMBAR SPINE .BMD measured in L1-L4 region of interest is 1.125 g/cm2. .T-score: - 0.6 .Z-score: - 0.5 .Comments: None.  FEMORAL NECK .BMD measured in left femoral neck region of interest is 1.016 g/cm2. .T-score: - 0.2 .Z-score: 0.5 .Comments: None.  TOTAL HIP .BMD measured in left total hip region of interest is 1.098 g/cm2. .T-score: 0.7 .Z-score: 1.0 .Comments: None.  Reviewed and addended history: Pt has a h/o cervical cancer, TAH/BSO in 2015. She developed postsurgical premature menopause at 48 years old  and she was started on hormone replacement therapy. She was previously seen by Dr. Barry Dienes at [facility], however, she wanted to see somebody in [place] as Dr. Kingsley Callander left practice. I reviewed Dr Lenox Ponds notes in [place].  She was initially treated with Climara patches>>  she could not use the patches as awould not stay put on her skin >> then on Divigel (Estradiol gel) 1 mg/day. She was not happy with this product due to the fact  that she needed s to apply it on a larger area of skin, on her lower back/buttocks area, but she was sweating and she felt that she may transfer her estrogen to her daughter, who sleeps with her.  She did admit that she did not have hot flashes while on this.  In 02/2017, we changed from [place] to [place] patches, to be changed twice a week.  These are expensive but she would like to continue because they do stay well in place..  She also has anxiety depression  and is under a lot of stress being a single mom and caring for her elderly mother.  She sees a Social worker. She is on Vyvanse and Pristiq.  Other problems: Obesity, chronic migraines (seen by neurology), IBS, colitis (Dr. Jessie Foot GI).  She had surgery for rectal fistula.  At previous visits we discussed at length about her diet and I suggested a plant-based diet.   ROS: Constitutional: no weight gain/no weight loss, no fatigue, no subjective hyperthermia, no subjective hypothermia Eyes: no blurry vision, no xerophthalmia ENT: no sore throat, no nodules palpated in neck, no dysphagia, no odynophagia, no hoarseness Cardiovascular: no CP/no SOB/no palpitations/no leg swelling Respiratory: no cough/no SOB/no wheezing Gastrointestinal: no N/no V/no D/no C/no acid reflux Musculoskeletal: no muscle aches/no joint aches Skin: no rashes, no hair loss Neurological: no tremors/no numbness/no tingling/no dizziness  I reviewed pt's medications, allergies, PMH, social hx, family hx, and changes were documented in the history of present illness. Otherwise, unchanged from my initial visit note.  Past Medical History:  Diagnosis Date  . 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 cervical ca  . Anal fistula   . Anxiety   . Cervical cancer [facility]) current oncologist -- dr Durwin Reges ([facility]  previously dr Rayford Halsted [facility]   dx 12/ 2014 invasive cervical cancer--- Stage IB2--- 06-11-2013  s/p  TAH w/ BSO and pelvic node dissection,  completed chemoradiation 04/ 2015  . Depression   .  Endometriosis   . Fibroid   . Hemorrhoids   . History of cancer chemotherapy    FOR CERVICAL CANCER , COMPLETED 08-2013  . History of radiation therapy    FOR CERVICAL CANCER , COMPLETED 08-2013  . Hypercholesteremia   . Hypertension   . IBS (irritable bowel syndrome)   . Migraines   . Nodule of upper lobe of left lung    last CT chest 05-15-2017 (care everywhere in  epic)   . OSA on CPAP   . 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 Urinary incontinence    Past Surgical History:  Procedure Laterality Date  . ABDOMINAL HYSTERECTOMY  06/11/2013   TAH/BSO  . BREAST SURGERY Right 11/25/2008   2 benign lumps  . COLONOSCOPY    . COLPOSCOPY    . EVALUATION UNDER ANESTHESIA WITH ANAL FISTULECTOMY N/A 07/06/2017   Procedure: ANAL EXAM UNDER ANESTHESIA;  Surgeon: Leighton Ruff, MD;  Location: [place];  Service: General;  Laterality: N/A;  . INCISION AND DRAINAGE PERIRECTAL ABSCESS N/A 04/26/2017   Procedure: IRRIGATION AND DEBRIDEMENT PERIRECTAL ABSCESS;  Surgeon: Johnathan Hausen, MD;  Location[facility];  Service: General;  Laterality: N/A;  . LAPAROSCOPY INCISIONAL HERNIA REPAIR W/ MESH, LYSIS ADHESIONS  01-10-2014   @[facility]   . LIGATION OF INTERNAL FISTULA TRACT N/A 03/08/2018   Procedure: LIGATION OF INTERNAL FISTULA TRACT;  Surgeon: Leighton Ruff, MD;  Location: [place];  Service: General;  Laterality: N/A;  . [place] N/A 07/06/2017   Procedure: PLACEMENT OF SETON;  Surgeon: Leighton Ruff, MD;  Location: [facility];  Service: General;  Laterality: N/A;  . TOTAL ABDOMINAL HYSTERECTOMY W/ BILATERAL SALPINGOOPHORECTOMY  06-11-2013    dr Rayford Halsted @[facility]    W/ [place]  . WISDOM TOOTH EXTRACTION     Social History   Social History  . Marital status: Single    Spouse name: N/A  . Number of children: 1   Occupational History  . 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 Smoking status: Former Smoker    Quit date: 04/26/1998  . Smokeless tobacco: Never Used  . Alcohol use No       . Drug use: No   Current Outpatient Medications on File Prior to Visit  Medication Sig Dispense Refill  . amLODipine (NORVASC) 5 MG tablet Take 5 mg by mouth daily.    Marland Kitchen aspirin-acetaminophen-caffeine (EXCEDRIN MIGRAINE) 250-250-65 MG tablet  Take 3 tablets by mouth every 6 (six) hours as needed for headache.    Marland Kitchen atorvastatin (LIPITOR) 10 MG tablet Take 10 mg by mouth daily at 6 PM.    . baclofen (LIORESAL) 10 MG tablet Take 1 tablet by mouth as needed.    . chlorproMAZINE (THORAZINE) 25 MG tablet Take 1 tablet by mouth as needed.    . cholecalciferol (VITAMIN D) 1000 UNITS tablet Take 1,000 Units by mouth daily.    Marland Kitchen desvenlafaxine (PRISTIQ) 100 MG 24 hr tablet Take 1 tablet by mouth daily.  1  . diphenhydramine-acetaminophen (TYLENOL PM) 25-500 MG TABS tablet Take 2 tablets by mouth at bedtime as needed (for sleep/pain).    . Multiple Vitamin (MULTIVITAMIN WITH MINERALS) TABS tablet Take 1 tablet by mouth daily.     No current facility-administered medications on file prior to visit.    Allergies  Allergen Reactions  . Amoxicillin Rash    Has patient had a PCN  reaction causing immediate rash, facial/tongue/throat swelling, SOB or lightheadedness with hypotension: Unknown Has patient had a PCN reaction causing severe rash involving mucus membranes or skin necrosis: Yes Has patient had a PCN reaction that required hospitalization: No Has patient had a PCN reaction occurring within the last 10 years: No If all of the above answers are "NO", then may proceed with Cephalosporin use.   Marland Kitchen Penicillins Rash    Has patient had a PCN reaction causing immediate rash, facial/tongue/throat swelling, SOB or lightheadedness with hypotension: Unknown Has patient had a PCN reaction causing severe rash involving mucus membranes or skin necrosis: Yes Has patient had a PCN reaction that required hospitalization: No Has patient had a PCN reaction occurring within the last 10 years: No If all of the above answers are "NO", then may proceed with Cephalosporin use.   . Sulfa Antibiotics Rash  . Garlic Diarrhea  . Onion Diarrhea   Family History  Problem Relation Age of Onset  . Hyperlipidemia Mother   . Cancer Father 64       bladder ca  .  Diabetes Father   . Diabetes Maternal Grandfather   . Stroke Maternal Grandfather   Also, diabetes in father, maternal grandfather Hypertension in mother Heart disease in maternal grandfather  Past Medical History:  Diagnosis Date  . 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 cervical ca  . Anal fistula   . Anxiety   . Cervical cancer [facility]) current oncologist -- dr Durwin Reges ([facility])/  previously dr Rayford Halsted 2 [facility]   dx 12/ 2014 invasive cervical cancer--- Stage IB2--- 06-11-2013  s/p  TAH w/ BSO and pelvic node dissection,  completed chemoradiation 04/ 2015  . Depression   . Endometriosis   . Fibroid   . Hemorrhoids   . History of cancer chemotherapy    FOR CERVICAL CANCER , COMPLETED 08-2013  . History of radiation therapy    FOR CERVICAL CANCER , COMPLETED 08-2013  . Hypercholesteremia   . Hypertension   . IBS (irritable bowel syndrome)   . Migraines   . Nodule of upper lobe of left lung    last CT chest 05-15-2017 (care everywhere in epic)   . OSA on CPAP   . 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 Urinary incontinence    Past Surgical History:  Procedure Laterality Date  . ABDOMINAL HYSTERECTOMY  06/11/2013   TAH/BSO  . BREAST SURGERY Right 11/25/2008   2 benign lumps  . COLONOSCOPY    . COLPOSCOPY    . EVALUATION UNDER ANESTHESIA WITH ANAL FISTULECTOMY N/A 07/06/2017   Procedure: ANAL EXAM UNDER ANESTHESIA;  Surgeon: Leighton Ruff, MD;  Location: [place];  Service: General;  Laterality: N/A;  . INCISION AND DRAINAGE PERIRECTAL ABSCESS N/A 04/26/2017   Procedure: IRRIGATION AND DEBRIDEMENT PERIRECTAL ABSCESS;  Surgeon: Johnathan Hausen, MD;  [facility];  Service: General;  Laterality: N/A;  . LAPAROSCOPY INCISIONAL HERNIA REPAIR W/ MESH, LYSIS ADHESIONS  01-10-2014   @[facility]   . LIGATION OF INTERNAL FISTULA TRACT N/A 03/08/2018   Procedure: LIGATION OF INTERNAL FISTULA TRACT;   Surgeon: Leighton Ruff, MD;  Location: [place];  Service: General;  Laterality: N/A;  . [place] N/A 07/06/2017   Procedure: PLACEMENT OF SETON;  Surgeon: Leighton Ruff, MD;  Location: [place];  Service: General;  Laterality: N/A;  . TOTAL ABDOMINAL HYSTERECTOMY W/  BILATERAL SALPINGOOPHORECTOMY  06-11-2013    dr Rayford Halsted @[facility]    W/ PELVIC LYMPH NODE DISSECTIONS  . WISDOM TOOTH EXTRACTION     Social History   Socioeconomic History  . Marital status: Single    Spouse name: Not on file  . Number of children: Not on file  . Years of education: Not on file  . Highest education level: Not on file  Occupational History  . Not on file  Social Needs  . Financial resource strain: Not on file  . Food insecurity:    Worry: Not on file    Inability: Not on file  . Transportation needs:    Medical: Not on file    Non-medical: Not on file  Tobacco Use  . Smoking status: Former Smoker    Last attempt to quit: 04/26/1998    Years since quitting: 20.1  . Smokeless tobacco: Never Used  Substance and Sexual Activity  . Alcohol use: No    Alcohol/week: 0.0 standard drinks    Comment: social  . Drug use: No  . Sexual activity: Not Currently    Birth control/protection: Surgical    Comment: TAH/BSO  Lifestyle  . Physical activity:    Days per week: Not on file    Minutes per session: Not on file  . Stress: Not on file  Relationships  . Social connections:    Talks on phone: Not on file    Gets together: Not on file    Attends religious service: Not on file    Active member of club or organization: Not on file    Attends meetings of clubs or organizations: Not on file    Relationship status: Not on file  . Intimate partner violence:    Fear of current or ex partner: Not on file    Emotionally abused: Not on file    Physically abused: Not on file    Forced sexual activity: Not on file  Other Topics Concern  . Not on file  Social  History Narrative   ** Merged History Encounter **       Current Outpatient Medications on File Prior to Visit  Medication Sig Dispense Refill  . amLODipine (NORVASC) 5 MG tablet Take 5 mg by mouth daily.    Marland Kitchen aspirin-acetaminophen-caffeine (EXCEDRIN MIGRAINE) 250-250-65 MG tablet Take 3 tablets by mouth every 6 (six) hours as needed for headache.    Marland Kitchen atorvastatin (LIPITOR) 10 MG tablet Take 10 mg by mouth daily at 6 PM.    . baclofen (LIORESAL) 10 MG tablet Take 1 tablet by mouth as needed.    . chlorproMAZINE (THORAZINE) 25 MG tablet Take 1 tablet by mouth as needed.    . cholecalciferol (VITAMIN D) 1000 UNITS tablet Take 1,000 Units by mouth daily.    Marland Kitchen desvenlafaxine (PRISTIQ) 100 MG 24 hr tablet Take 1 tablet by mouth daily.  1  . diphenhydramine-acetaminophen (TYLENOL PM) 25-500 MG TABS tablet Take 2 tablets by mouth at bedtime as needed (for sleep/pain).    . Multiple Vitamin (MULTIVITAMIN WITH MINERALS) TABS tablet Take 1 tablet by mouth daily.     No current facility-administered medications on file prior to visit.    Allergies  Allergen Reactions  . Amoxicillin Rash    Has patient had a PCN reaction causing immediate rash, facial/tongue/throat swelling, SOB or lightheadedness with hypotension: Unknown Has patient had a PCN reaction causing severe rash involving mucus membranes or skin necrosis: Yes Has patient had a PCN reaction that  required hospitalization: No Has patient had a PCN reaction occurring within the last 10 years: No If all of the above answers are "NO", then may proceed with Cephalosporin use.   Marland Kitchen Penicillins Rash    Has patient had a PCN reaction causing immediate rash, facial/tongue/throat swelling, SOB or lightheadedness with hypotension: Unknown Has patient had a PCN reaction causing severe rash involving mucus membranes or skin necrosis: Yes Has patient had a PCN reaction that required hospitalization: No Has patient had a PCN reaction occurring within  the last 10 years: No If all of the above answers are "NO", then may proceed with Cephalosporin use.   . Sulfa Antibiotics Rash  . Garlic Diarrhea  . Onion Diarrhea   Family History  Problem Relation Age of Onset  . Hyperlipidemia Mother   . Cancer Father 45       bladder ca  . Diabetes Father   . Diabetes Maternal Grandfather   . Stroke Maternal Grandfather     PE: There were no vitals taken for this visit. Wt Readings from Last 3 Encounters:  05/23/18 244 lb (110.7 kg)  03/08/18 240 lb (108.9 kg)  08/28/17 218 lb (98.9 kg)   Constitutional: overweight, in NAD Eyes: PERRLA, EOMI, no exophthalmos ENT: moist mucous membranes, no thyromegaly, no cervical lymphadenopathy Cardiovascular: RRR, No MRG Respiratory: CTA B Gastrointestinal: abdomen soft, NT, ND, BS+ Musculoskeletal: no deformities, strength intact in all 4 Skin: moist, warm, no rashes Neurological: no tremor with outstretched hands, DTR normal in all 4  ASSESSMENT: 1. Premature surgical menopause  2.  Vitamin D insufficiency  3. Obesity class 2 BMI Classification:  < 18.5 underweight   18.5-24.9 normal weight   25.0-29.9 overweight   30.0-34.9 class I obesity   35.0-39.9 class II obesity   ? 40.0 class III obesity   PLAN: 1. Patient with history of surgical premature menopause after her TAH + BSO for cervical cancer.  She was initially started on Climara, but she was having problems with the patch as she was not able to keep it in place.  She was then switched to estradiol gel (Divigel), which she applied to the buttocks area/lower back, however, she was concerned about transferring it to her daughter.  Also, she was not happy that this took a long time to dry and it was also expensive.  We switched her to [facility] after discussion about needing to stay on estrogen until the average age of menopause, 48 years old, to avoid hot flashes, vaginal dryness, and especially decreased bone mineral density.   She is tolerating Vivelle-Dot well, and this stays in place as she has to change the patch twice a week rather than only once a week. -No progesterone needed since she does not have a uterus -Reviewed most recent bone density scan from 2017 and all her scores were normal.  We will continue to keep an eye on this.  I plan to repeat a bone density scan 5 years from the previous.  2.  Vitamin D insufficiency -Vitamin level returned slightly low during investigation for fatigue.  Her TFTs and B12 levels were normal at that time. -We started her on 2000 units vitamin D daily-she continues on this -We will check a vitamin D level today  3. Obesity class 2 -She gained weight since last visit: More than 25 pounds.  RTC in 6 mo.  Philemon Kingdom, MD PhD [facility] Endocrinology

## 2018-06-05 NOTE — Telephone Encounter (Signed)
6 mo 

## 2018-06-06 DIAGNOSIS — R110 Nausea: Secondary | ICD-10-CM | POA: Diagnosis not present

## 2018-06-06 DIAGNOSIS — R51 Headache: Secondary | ICD-10-CM | POA: Diagnosis not present

## 2018-06-06 DIAGNOSIS — M47812 Spondylosis without myelopathy or radiculopathy, cervical region: Secondary | ICD-10-CM | POA: Diagnosis not present

## 2018-06-06 DIAGNOSIS — R42 Dizziness and giddiness: Secondary | ICD-10-CM | POA: Diagnosis not present

## 2018-06-06 DIAGNOSIS — M47892 Other spondylosis, cervical region: Secondary | ICD-10-CM | POA: Diagnosis not present

## 2018-06-06 NOTE — Telephone Encounter (Signed)
I have contacted patient.

## 2018-06-07 DIAGNOSIS — M25562 Pain in left knee: Secondary | ICD-10-CM | POA: Diagnosis not present

## 2018-06-07 DIAGNOSIS — M1712 Unilateral primary osteoarthritis, left knee: Secondary | ICD-10-CM | POA: Diagnosis not present

## 2018-06-07 DIAGNOSIS — R51 Headache: Secondary | ICD-10-CM | POA: Diagnosis not present

## 2018-06-07 DIAGNOSIS — M9902 Segmental and somatic dysfunction of thoracic region: Secondary | ICD-10-CM | POA: Diagnosis not present

## 2018-06-07 DIAGNOSIS — M9901 Segmental and somatic dysfunction of cervical region: Secondary | ICD-10-CM | POA: Diagnosis not present

## 2018-06-07 DIAGNOSIS — M9903 Segmental and somatic dysfunction of lumbar region: Secondary | ICD-10-CM | POA: Diagnosis not present

## 2018-06-07 DIAGNOSIS — M2242 Chondromalacia patellae, left knee: Secondary | ICD-10-CM | POA: Diagnosis not present

## 2018-06-07 DIAGNOSIS — F331 Major depressive disorder, recurrent, moderate: Secondary | ICD-10-CM | POA: Diagnosis not present

## 2018-06-09 DIAGNOSIS — N6489 Other specified disorders of breast: Secondary | ICD-10-CM | POA: Diagnosis not present

## 2018-06-09 DIAGNOSIS — R928 Other abnormal and inconclusive findings on diagnostic imaging of breast: Secondary | ICD-10-CM | POA: Diagnosis not present

## 2018-06-11 DIAGNOSIS — M1712 Unilateral primary osteoarthritis, left knee: Secondary | ICD-10-CM | POA: Diagnosis not present

## 2018-06-11 DIAGNOSIS — B850 Pediculosis due to Pediculus humanus capitis: Secondary | ICD-10-CM | POA: Diagnosis not present

## 2018-06-11 DIAGNOSIS — M546 Pain in thoracic spine: Secondary | ICD-10-CM | POA: Diagnosis not present

## 2018-06-11 DIAGNOSIS — R293 Abnormal posture: Secondary | ICD-10-CM | POA: Diagnosis not present

## 2018-06-11 DIAGNOSIS — M542 Cervicalgia: Secondary | ICD-10-CM | POA: Diagnosis not present

## 2018-06-11 DIAGNOSIS — R51 Headache: Secondary | ICD-10-CM | POA: Diagnosis not present

## 2018-06-15 DIAGNOSIS — H1089 Other conjunctivitis: Secondary | ICD-10-CM | POA: Diagnosis not present

## 2018-06-15 DIAGNOSIS — Z882 Allergy status to sulfonamides status: Secondary | ICD-10-CM | POA: Diagnosis not present

## 2018-06-19 DIAGNOSIS — M546 Pain in thoracic spine: Secondary | ICD-10-CM | POA: Diagnosis not present

## 2018-06-19 DIAGNOSIS — R293 Abnormal posture: Secondary | ICD-10-CM | POA: Diagnosis not present

## 2018-06-19 DIAGNOSIS — R51 Headache: Secondary | ICD-10-CM | POA: Diagnosis not present

## 2018-06-19 DIAGNOSIS — M542 Cervicalgia: Secondary | ICD-10-CM | POA: Diagnosis not present

## 2018-06-25 DIAGNOSIS — M25561 Pain in right knee: Secondary | ICD-10-CM | POA: Diagnosis not present

## 2018-06-26 DIAGNOSIS — M9902 Segmental and somatic dysfunction of thoracic region: Secondary | ICD-10-CM | POA: Diagnosis not present

## 2018-06-26 DIAGNOSIS — M9901 Segmental and somatic dysfunction of cervical region: Secondary | ICD-10-CM | POA: Diagnosis not present

## 2018-06-26 DIAGNOSIS — M9903 Segmental and somatic dysfunction of lumbar region: Secondary | ICD-10-CM | POA: Diagnosis not present

## 2018-06-26 DIAGNOSIS — R51 Headache: Secondary | ICD-10-CM | POA: Diagnosis not present

## 2018-06-28 DIAGNOSIS — G43719 Chronic migraine without aura, intractable, without status migrainosus: Secondary | ICD-10-CM | POA: Diagnosis not present

## 2018-07-12 DIAGNOSIS — G518 Other disorders of facial nerve: Secondary | ICD-10-CM | POA: Diagnosis not present

## 2018-07-12 DIAGNOSIS — G43719 Chronic migraine without aura, intractable, without status migrainosus: Secondary | ICD-10-CM | POA: Diagnosis not present

## 2018-07-12 DIAGNOSIS — F5081 Binge eating disorder: Secondary | ICD-10-CM | POA: Diagnosis not present

## 2018-07-12 DIAGNOSIS — M542 Cervicalgia: Secondary | ICD-10-CM | POA: Diagnosis not present

## 2018-07-12 DIAGNOSIS — M791 Myalgia, unspecified site: Secondary | ICD-10-CM | POA: Diagnosis not present

## 2018-07-13 DIAGNOSIS — R293 Abnormal posture: Secondary | ICD-10-CM | POA: Diagnosis not present

## 2018-07-13 DIAGNOSIS — M546 Pain in thoracic spine: Secondary | ICD-10-CM | POA: Diagnosis not present

## 2018-07-13 DIAGNOSIS — R51 Headache: Secondary | ICD-10-CM | POA: Diagnosis not present

## 2018-07-13 DIAGNOSIS — M542 Cervicalgia: Secondary | ICD-10-CM | POA: Diagnosis not present

## 2018-07-17 DIAGNOSIS — G4710 Hypersomnia, unspecified: Secondary | ICD-10-CM | POA: Diagnosis not present

## 2018-07-17 DIAGNOSIS — G4733 Obstructive sleep apnea (adult) (pediatric): Secondary | ICD-10-CM | POA: Diagnosis not present

## 2018-07-19 DIAGNOSIS — F5081 Binge eating disorder: Secondary | ICD-10-CM | POA: Diagnosis not present

## 2018-07-20 ENCOUNTER — Telehealth: Payer: Self-pay | Admitting: Obstetrics and Gynecology

## 2018-07-20 NOTE — Telephone Encounter (Signed)
Thank you for the update. You may close the encounter. 

## 2018-07-20 NOTE — Telephone Encounter (Signed)
I reached out to the patient to reschedule her appointment on 07/23/18 with Dr. Quincy Simmonds for "follow up" but patient said she needed to cancel her appointment for now due to ongoing knee problems. Patient stated she will call back to reschedule at some point later this year.  Routing to provider for review.

## 2018-07-23 ENCOUNTER — Ambulatory Visit: Payer: BLUE CROSS/BLUE SHIELD | Admitting: Obstetrics and Gynecology

## 2018-07-24 DIAGNOSIS — M25562 Pain in left knee: Secondary | ICD-10-CM | POA: Diagnosis not present

## 2018-07-24 DIAGNOSIS — M25561 Pain in right knee: Secondary | ICD-10-CM | POA: Diagnosis not present

## 2018-07-25 DIAGNOSIS — R51 Headache: Secondary | ICD-10-CM | POA: Diagnosis not present

## 2018-07-25 DIAGNOSIS — M9902 Segmental and somatic dysfunction of thoracic region: Secondary | ICD-10-CM | POA: Diagnosis not present

## 2018-07-25 DIAGNOSIS — M9903 Segmental and somatic dysfunction of lumbar region: Secondary | ICD-10-CM | POA: Diagnosis not present

## 2018-07-25 DIAGNOSIS — M9901 Segmental and somatic dysfunction of cervical region: Secondary | ICD-10-CM | POA: Diagnosis not present

## 2018-07-26 DIAGNOSIS — M542 Cervicalgia: Secondary | ICD-10-CM | POA: Diagnosis not present

## 2018-07-26 DIAGNOSIS — M546 Pain in thoracic spine: Secondary | ICD-10-CM | POA: Diagnosis not present

## 2018-07-26 DIAGNOSIS — R293 Abnormal posture: Secondary | ICD-10-CM | POA: Diagnosis not present

## 2018-07-26 DIAGNOSIS — R51 Headache: Secondary | ICD-10-CM | POA: Diagnosis not present

## 2018-08-09 DIAGNOSIS — F509 Eating disorder, unspecified: Secondary | ICD-10-CM | POA: Diagnosis not present

## 2018-08-09 DIAGNOSIS — F411 Generalized anxiety disorder: Secondary | ICD-10-CM | POA: Diagnosis not present

## 2018-08-09 DIAGNOSIS — F331 Major depressive disorder, recurrent, moderate: Secondary | ICD-10-CM | POA: Diagnosis not present

## 2018-08-16 DIAGNOSIS — F331 Major depressive disorder, recurrent, moderate: Secondary | ICD-10-CM | POA: Diagnosis not present

## 2018-08-16 DIAGNOSIS — F411 Generalized anxiety disorder: Secondary | ICD-10-CM | POA: Diagnosis not present

## 2018-08-23 DIAGNOSIS — F411 Generalized anxiety disorder: Secondary | ICD-10-CM | POA: Diagnosis not present

## 2018-08-23 DIAGNOSIS — F509 Eating disorder, unspecified: Secondary | ICD-10-CM | POA: Diagnosis not present

## 2018-08-23 DIAGNOSIS — F331 Major depressive disorder, recurrent, moderate: Secondary | ICD-10-CM | POA: Diagnosis not present

## 2018-08-30 DIAGNOSIS — F331 Major depressive disorder, recurrent, moderate: Secondary | ICD-10-CM | POA: Diagnosis not present

## 2018-08-30 DIAGNOSIS — F509 Eating disorder, unspecified: Secondary | ICD-10-CM | POA: Diagnosis not present

## 2018-09-06 DIAGNOSIS — F509 Eating disorder, unspecified: Secondary | ICD-10-CM | POA: Diagnosis not present

## 2018-09-06 DIAGNOSIS — F331 Major depressive disorder, recurrent, moderate: Secondary | ICD-10-CM | POA: Diagnosis not present

## 2018-09-10 IMAGING — CT CT PELVIS W/ CM
2 of 4 series · 16 of 46 positions shown, 18 images · IV contrast (ISOVUE)
Comparison: 04/20/2017

CLINICAL DATA: History of perirectal abscess concern for
recurrence.

EXAM:
CT PELVIS WITH CONTRAST
TECHNIQUE: Multidetector CT imaging of the pelvis was performed using the
standard protocol following the bolus administration of intravenous
contrast.
CONTRAST:  100 cc [id number] IV

[Series 3: axial st · axial · 0.98mm/px · z∈[-533,-293]mm · 13 of 136 slices shown, 15 images]
[im 11/136  soft-tissue]
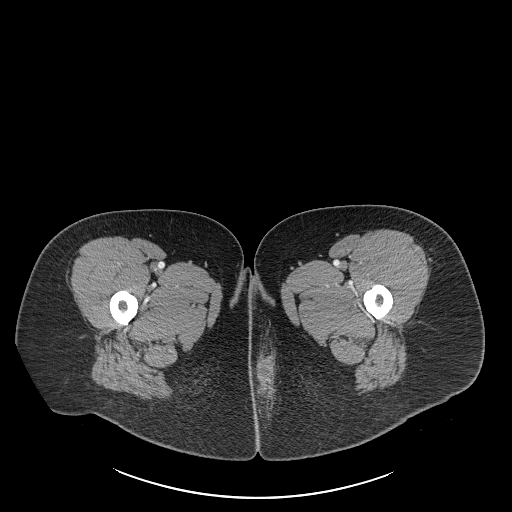
[im 11/136  bone]
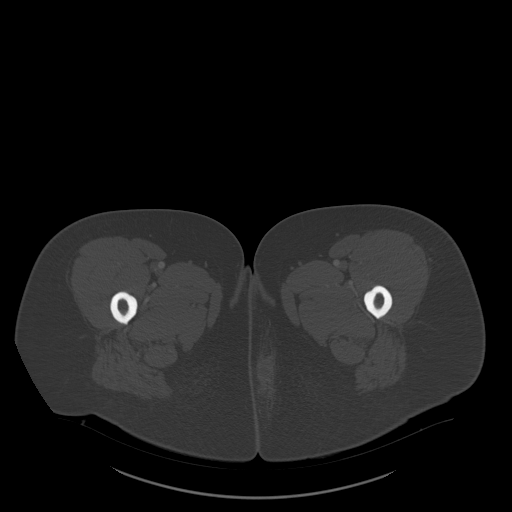
[im 21/136  soft-tissue]
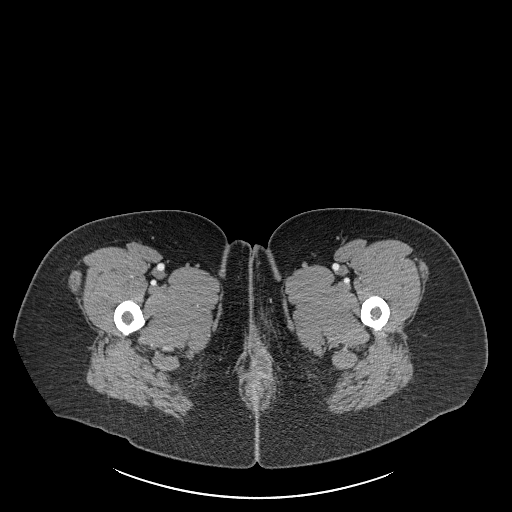
[im 31/136  soft-tissue]
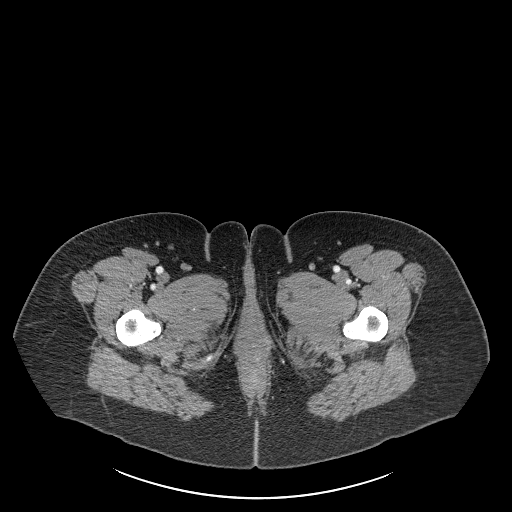
[im 41/136  soft-tissue]
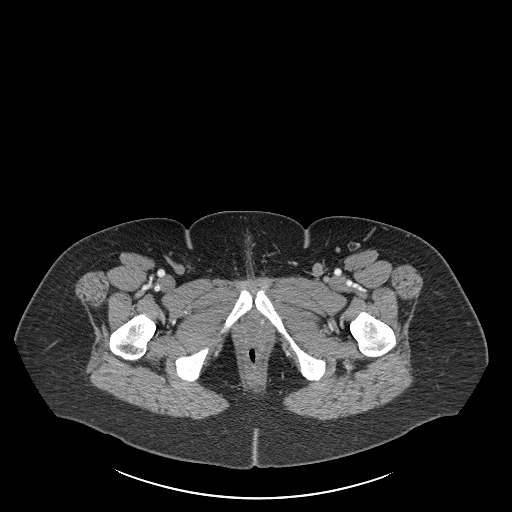
[im 51/136  soft-tissue]
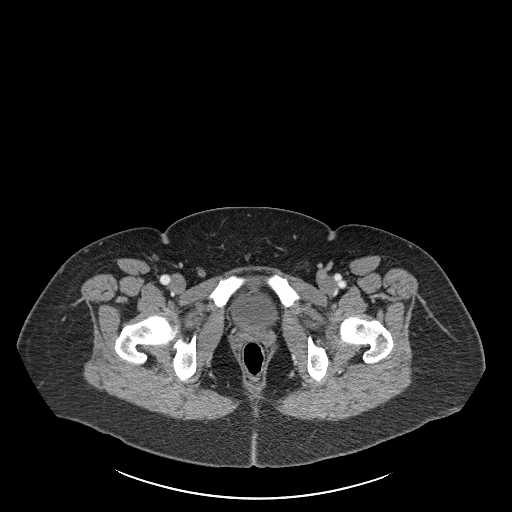
[im 61/136  soft-tissue]
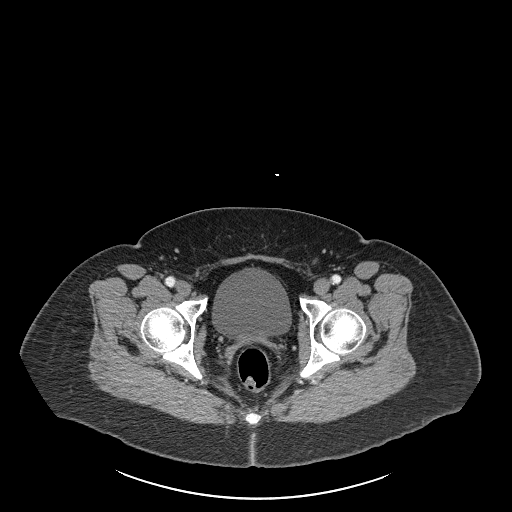
[im 71/136  soft-tissue]
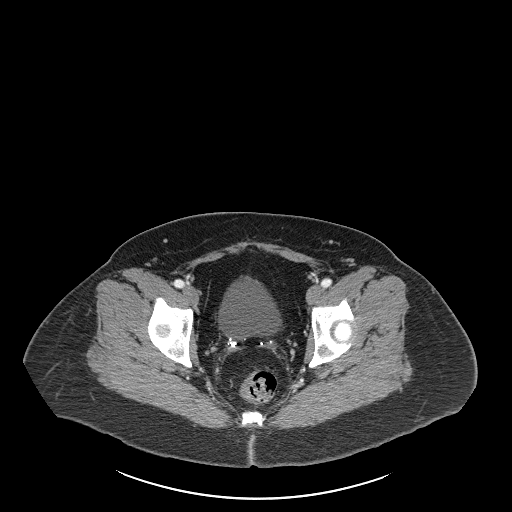
[im 81/136  soft-tissue]
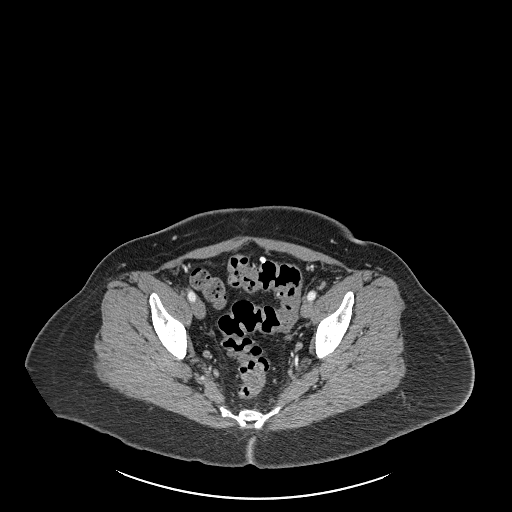
[im 91/136  soft-tissue]
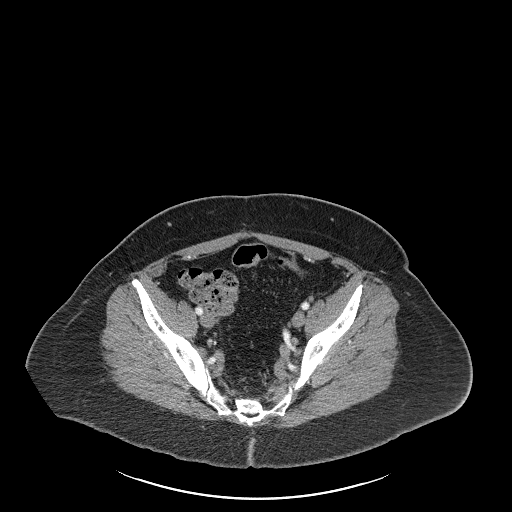
[im 91/136  bone]
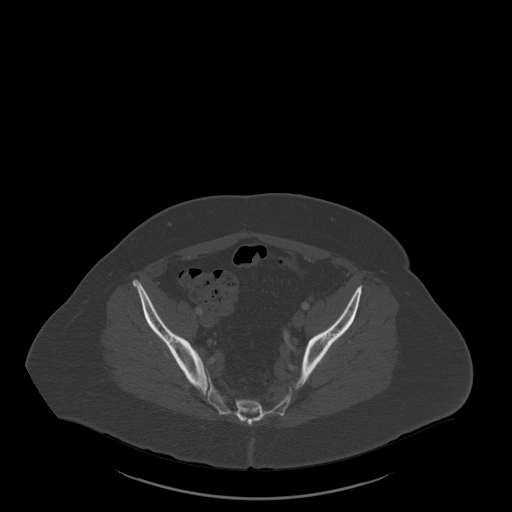
[im 101/136  soft-tissue]
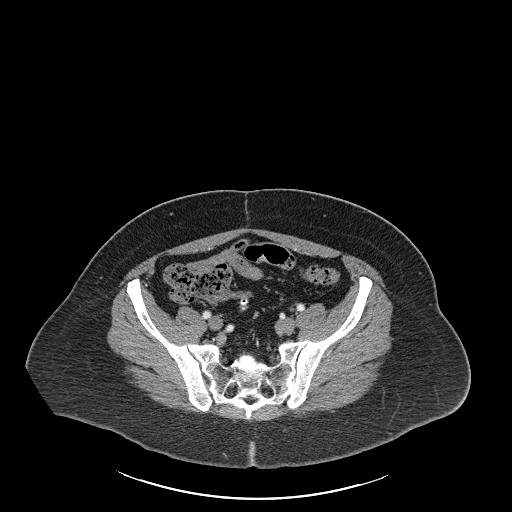
[im 111/136  soft-tissue]
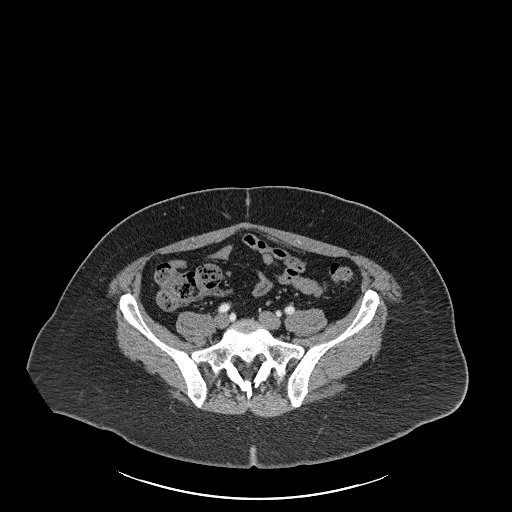
[im 121/136  soft-tissue]
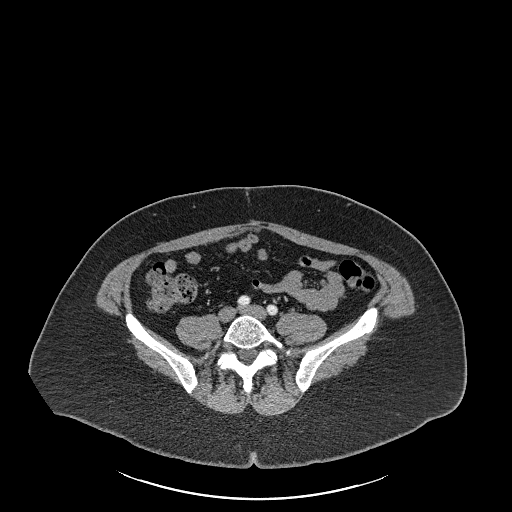
[im 131/136  soft-tissue]
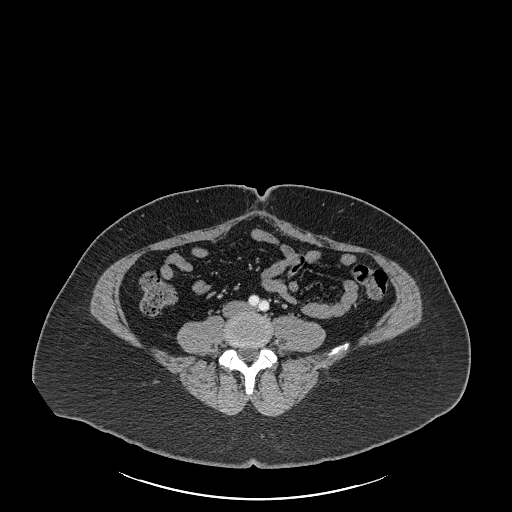

[Series 12: coronal st · coronal · 0.53mm/px · 3 of 143 slices shown]
[im 48/143  soft-tissue]
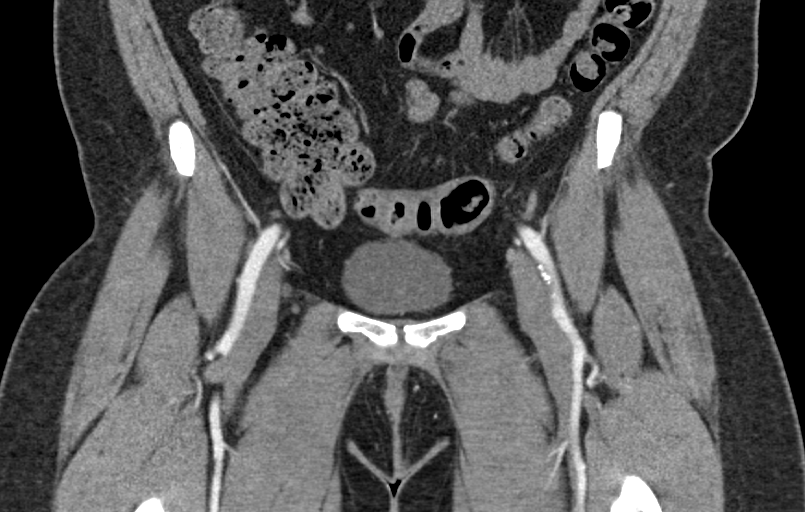
[im 64/143  soft-tissue]
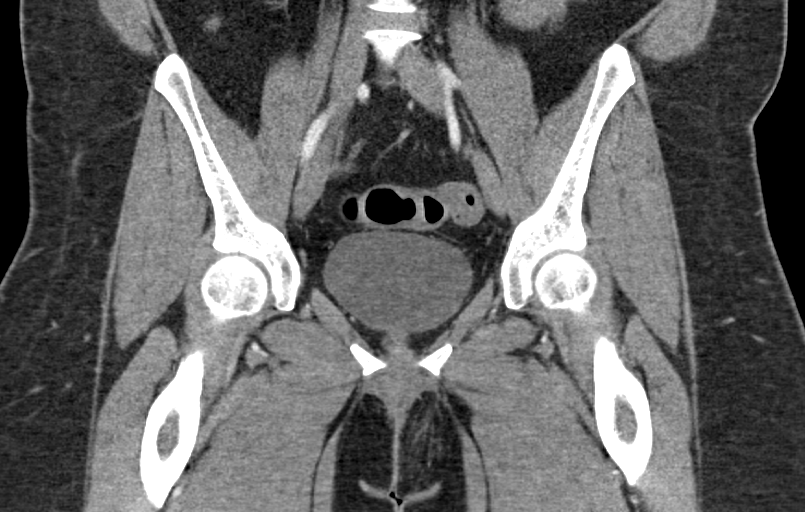
[im 79/143  soft-tissue]
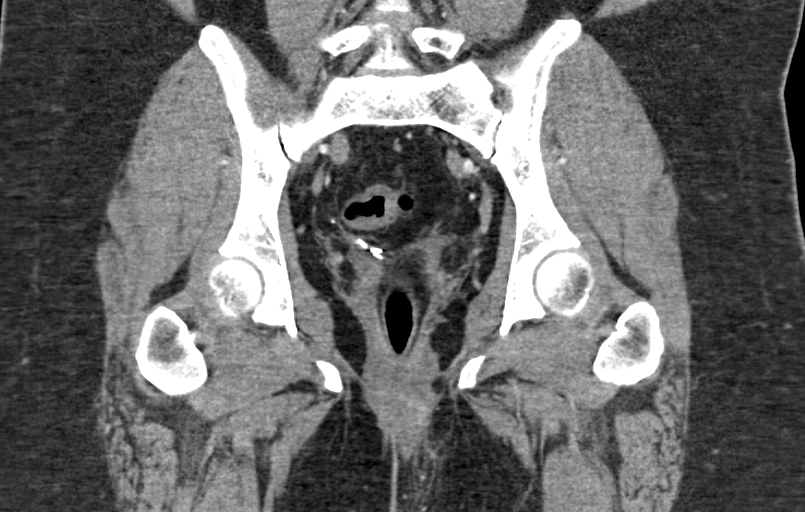

[16 of 46 positions shown; findings below may reference images not displayed]

FINDINGS: Urinary Tract: Visualized distal ureters and urinary bladder
unremarkable.

Bowel: Normal appendix. Scattered sigmoid diverticula. Visualized
small bowel decompressed, unremarkable.

Vascular/Lymphatic: No evidence of aneurysm or adenopathy.

Reproductive:  Prior hysterectomy.  No adnexal masses.

Other: No free fluid or free air. There is a focal fluid collection
noted in the left perirectal soft tissues/left perineum that
measures 2.1 x 1.7 cm on axial image 117, extending over a
craniocaudal length of approximately 4 cm on coronal image 60.

Musculoskeletal: No acute bony abnormality.
IMPRESSION: Perirectal fluid collection in the left perineum and medial left
buttock compatible with perirectal abscess.

Sigmoid diverticulosis.

## 2018-09-13 DIAGNOSIS — F509 Eating disorder, unspecified: Secondary | ICD-10-CM | POA: Diagnosis not present

## 2018-09-13 DIAGNOSIS — F331 Major depressive disorder, recurrent, moderate: Secondary | ICD-10-CM | POA: Diagnosis not present

## 2018-09-20 DIAGNOSIS — F509 Eating disorder, unspecified: Secondary | ICD-10-CM | POA: Diagnosis not present

## 2018-09-20 DIAGNOSIS — F331 Major depressive disorder, recurrent, moderate: Secondary | ICD-10-CM | POA: Diagnosis not present

## 2018-09-20 DIAGNOSIS — F411 Generalized anxiety disorder: Secondary | ICD-10-CM | POA: Diagnosis not present

## 2018-09-27 DIAGNOSIS — F331 Major depressive disorder, recurrent, moderate: Secondary | ICD-10-CM | POA: Diagnosis not present

## 2018-09-27 DIAGNOSIS — F509 Eating disorder, unspecified: Secondary | ICD-10-CM | POA: Diagnosis not present

## 2018-10-02 DIAGNOSIS — G43719 Chronic migraine without aura, intractable, without status migrainosus: Secondary | ICD-10-CM | POA: Diagnosis not present

## 2018-10-04 DIAGNOSIS — F509 Eating disorder, unspecified: Secondary | ICD-10-CM | POA: Diagnosis not present

## 2018-10-04 DIAGNOSIS — F331 Major depressive disorder, recurrent, moderate: Secondary | ICD-10-CM | POA: Diagnosis not present

## 2018-10-09 DIAGNOSIS — G4733 Obstructive sleep apnea (adult) (pediatric): Secondary | ICD-10-CM | POA: Diagnosis not present

## 2018-10-09 DIAGNOSIS — G4710 Hypersomnia, unspecified: Secondary | ICD-10-CM | POA: Diagnosis not present

## 2018-10-10 DIAGNOSIS — R51 Headache: Secondary | ICD-10-CM | POA: Diagnosis not present

## 2018-10-10 DIAGNOSIS — M9902 Segmental and somatic dysfunction of thoracic region: Secondary | ICD-10-CM | POA: Diagnosis not present

## 2018-10-10 DIAGNOSIS — M9903 Segmental and somatic dysfunction of lumbar region: Secondary | ICD-10-CM | POA: Diagnosis not present

## 2018-10-10 DIAGNOSIS — M9901 Segmental and somatic dysfunction of cervical region: Secondary | ICD-10-CM | POA: Diagnosis not present

## 2018-10-11 DIAGNOSIS — F411 Generalized anxiety disorder: Secondary | ICD-10-CM | POA: Diagnosis not present

## 2018-10-11 DIAGNOSIS — F331 Major depressive disorder, recurrent, moderate: Secondary | ICD-10-CM | POA: Diagnosis not present

## 2018-10-11 DIAGNOSIS — M542 Cervicalgia: Secondary | ICD-10-CM | POA: Diagnosis not present

## 2018-10-11 DIAGNOSIS — G43719 Chronic migraine without aura, intractable, without status migrainosus: Secondary | ICD-10-CM | POA: Diagnosis not present

## 2018-10-11 DIAGNOSIS — F5081 Binge eating disorder: Secondary | ICD-10-CM | POA: Diagnosis not present

## 2018-10-16 DIAGNOSIS — G4733 Obstructive sleep apnea (adult) (pediatric): Secondary | ICD-10-CM | POA: Diagnosis not present

## 2018-10-16 DIAGNOSIS — G4710 Hypersomnia, unspecified: Secondary | ICD-10-CM | POA: Diagnosis not present

## 2018-10-18 DIAGNOSIS — M9902 Segmental and somatic dysfunction of thoracic region: Secondary | ICD-10-CM | POA: Diagnosis not present

## 2018-10-18 DIAGNOSIS — M9903 Segmental and somatic dysfunction of lumbar region: Secondary | ICD-10-CM | POA: Diagnosis not present

## 2018-10-18 DIAGNOSIS — M9901 Segmental and somatic dysfunction of cervical region: Secondary | ICD-10-CM | POA: Diagnosis not present

## 2018-10-18 DIAGNOSIS — R51 Headache: Secondary | ICD-10-CM | POA: Diagnosis not present

## 2018-10-18 DIAGNOSIS — F509 Eating disorder, unspecified: Secondary | ICD-10-CM | POA: Diagnosis not present

## 2018-10-25 DIAGNOSIS — M9903 Segmental and somatic dysfunction of lumbar region: Secondary | ICD-10-CM | POA: Diagnosis not present

## 2018-10-25 DIAGNOSIS — F509 Eating disorder, unspecified: Secondary | ICD-10-CM | POA: Diagnosis not present

## 2018-10-25 DIAGNOSIS — R51 Headache: Secondary | ICD-10-CM | POA: Diagnosis not present

## 2018-10-25 DIAGNOSIS — F331 Major depressive disorder, recurrent, moderate: Secondary | ICD-10-CM | POA: Diagnosis not present

## 2018-10-25 DIAGNOSIS — M9901 Segmental and somatic dysfunction of cervical region: Secondary | ICD-10-CM | POA: Diagnosis not present

## 2018-10-25 DIAGNOSIS — M9902 Segmental and somatic dysfunction of thoracic region: Secondary | ICD-10-CM | POA: Diagnosis not present

## 2018-10-30 DIAGNOSIS — F5081 Binge eating disorder: Secondary | ICD-10-CM | POA: Diagnosis not present

## 2018-10-30 DIAGNOSIS — F331 Major depressive disorder, recurrent, moderate: Secondary | ICD-10-CM | POA: Diagnosis not present

## 2018-11-01 DIAGNOSIS — M9901 Segmental and somatic dysfunction of cervical region: Secondary | ICD-10-CM | POA: Diagnosis not present

## 2018-11-01 DIAGNOSIS — M9902 Segmental and somatic dysfunction of thoracic region: Secondary | ICD-10-CM | POA: Diagnosis not present

## 2018-11-01 DIAGNOSIS — R51 Headache: Secondary | ICD-10-CM | POA: Diagnosis not present

## 2018-11-01 DIAGNOSIS — M9903 Segmental and somatic dysfunction of lumbar region: Secondary | ICD-10-CM | POA: Diagnosis not present

## 2018-11-08 DIAGNOSIS — F5081 Binge eating disorder: Secondary | ICD-10-CM | POA: Diagnosis not present

## 2018-11-14 DIAGNOSIS — R635 Abnormal weight gain: Secondary | ICD-10-CM | POA: Diagnosis not present

## 2018-11-15 DIAGNOSIS — F509 Eating disorder, unspecified: Secondary | ICD-10-CM | POA: Diagnosis not present

## 2018-11-17 ENCOUNTER — Ambulatory Visit (INDEPENDENT_AMBULATORY_CARE_PROVIDER_SITE_OTHER)
Admission: EM | Admit: 2018-11-17 | Discharge: 2018-11-17 | Disposition: A | Discharge status: Home / Self Care | Payer: BC Managed Care – PPO | Source: Home / Self Care

## 2018-11-17 ENCOUNTER — Ambulatory Visit
Admission: RE | Admit: 2018-11-17 | Discharge: 2018-11-17 | Disposition: A | Discharge status: Home / Self Care | Payer: BLUE CROSS/BLUE SHIELD | Source: Ambulatory Visit

## 2018-11-17 DIAGNOSIS — N3001 Acute cystitis with hematuria: Secondary | ICD-10-CM

## 2018-11-17 MED ORDER — NITROFURANTOIN MONOHYD MACRO 100 MG PO CAPS: 100.0000 mg | ORAL_CAPSULE | Freq: Two times a day (BID) | ORAL | 0 refills | Status: AC

## 2018-11-17 NOTE — ED Provider Notes (Signed)
Virtual Visit via Video Note:  [name]  initiated request for Telemedicine visit with [facility] team. I connected with [name]  on 11/17/2018 at 1:26 PM  for a synchronized telemedicine visit using a video enabled HIPPA compliant telemedicine application. I verified that I am speaking with Alison Priest [facility]  using two identifiers. [place] Hall-Potvin, PA-C  was physically located in a [place] [facility] site and [name] was located at a different location.   The limitations of evaluation and management by telemedicine as well as the availability of in-person appointments were discussed. Patient was informed that she  may incur a bill ( including co-pay) for this virtual visit encounter. [name]  expressed understanding and gave verbal consent to proceed with virtual visit.     History of Present Illness:[name]  is a 48 y.o. female with history of anal fistula, cervical cancer, colitis presents with concern of UTI.  Patient states that she has been having urinary urgency, frequency, burning with urination since this morning.  Patient states her last UTI was in December.  States she was treated with Macrobid for this, which resolve her symptoms.  Patient underwent ligation of her internal anal fistula on 84/69 without complication.  Patient is followed by oncology for her cervical cancer routinely.  Patient denies blood in her urine, vaginal or pelvic pain, vaginal bleeding.  Painful bowel movements, blood in her stool.  No fever, chills, fatigue, abdominal or back pain.  Denies history of pyelonephritis or renal calculi.    Past Medical History:  Diagnosis Date  . 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 cervical ca  . Anal fistula   . Anxiety   . Cervical cancer [facility]) current oncologist -- dr Durwin Reges ([facility])/  previously dr Rayford Halsted 2 [facility]   dx 12/ 2014 invasive  cervical cancer--- Stage IB2--- 06-11-2013  s/p  TAH w/ BSO and pelvic node dissection,  completed chemoradiation 04/ 2015  . Depression   . Endometriosis   . Fibroid   . Hemorrhoids   . History of cancer chemotherapy    FOR CERVICAL CANCER , COMPLETED 08-2013  . History of radiation therapy    FOR CERVICAL CANCER , COMPLETED 08-2013  . Hypercholesteremia   . Hypertension   . IBS (irritable bowel syndrome)   . Migraines   . Nodule of upper lobe of left lung    last CT chest 05-15-2017 (care everywhere in epic)   . OSA on CPAP   . 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 Urinary incontinence     Allergies  Allergen Reactions  . Amoxicillin Rash    Has patient had a PCN reaction causing immediate rash, facial/tongue/throat swelling, SOB or lightheadedness with hypotension: Unknown Has patient had a PCN reaction causing severe rash involving mucus membranes or skin necrosis: Yes Has patient had a PCN reaction that required hospitalization: No Has patient had a PCN reaction occurring within the last 10 years: No If all of the above answers are "NO", then may proceed with Cephalosporin use.   Marland Kitchen Penicillins Rash    Has patient had a PCN reaction causing immediate rash, facial/tongue/throat swelling, SOB or lightheadedness with hypotension: Unknown Has patient had a PCN reaction causing severe rash involving mucus membranes or skin necrosis: Yes Has patient had a PCN reaction that required hospitalization: No Has patient had  a PCN reaction occurring within the last 10 years: No If all of the above answers are "NO", then may proceed with Cephalosporin use.   . Sulfa Antibiotics Rash  . Garlic Diarrhea  . Onion Diarrhea        Observations/Objective: Pleasant 48 year old female sitting in no acute distress.  Patient is nontoxic, able to speak in full sentences without, wheezing.  Assessment and Plan: 48 year old female with history of colitis and anal fistula status  post ligation presenting for UTI.  Will treat with Macrobid as patient has had resolution of symptoms in the past of this.  Discussed strict return precautions, particularly following up with surgeon should she develop stool in her urine.  Strict return precautions were discussed, patient verbalized understanding is agreeable to plan.  Follow Up Instructions: Patient to follow-up in person should symptoms not resolve/worsen.  Patient to follow-up with her surgeon should she develop stool in her urine.   I discussed the assessment and treatment plan with the patient. The patient was provided an opportunity to ask questions and all were answered. The patient agreed with the plan and demonstrated an understanding of the instructions.   The patient was advised to call back or seek an in-person evaluation if the symptoms worsen or if the condition fails to improve as anticipated.  I provided 15 minutes of non-face-to-face time during this encounter.    [place] Hall-Potvin, PA-C  11/17/2018 1:26 PM        Hall-Potvin, [place], [place] 11/18/18 1321

## 2018-11-17 NOTE — Discharge Instructions (Addendum)
Antibiotic as prescribed. You need to be evaluated in the office should she have worsening, persistent symptoms, develop fever, chills, abdominal/pelvic/back pain.

## 2018-11-19 DIAGNOSIS — F509 Eating disorder, unspecified: Secondary | ICD-10-CM | POA: Diagnosis not present

## 2018-11-21 DIAGNOSIS — R51 Headache: Secondary | ICD-10-CM | POA: Diagnosis not present

## 2018-11-21 DIAGNOSIS — M9902 Segmental and somatic dysfunction of thoracic region: Secondary | ICD-10-CM | POA: Diagnosis not present

## 2018-11-21 DIAGNOSIS — M9901 Segmental and somatic dysfunction of cervical region: Secondary | ICD-10-CM | POA: Diagnosis not present

## 2018-11-21 DIAGNOSIS — M9903 Segmental and somatic dysfunction of lumbar region: Secondary | ICD-10-CM | POA: Diagnosis not present

## 2018-11-22 DIAGNOSIS — E785 Hyperlipidemia, unspecified: Secondary | ICD-10-CM | POA: Diagnosis not present

## 2018-11-22 DIAGNOSIS — D2239 Melanocytic nevi of other parts of face: Secondary | ICD-10-CM | POA: Diagnosis not present

## 2018-11-22 DIAGNOSIS — L918 Other hypertrophic disorders of the skin: Secondary | ICD-10-CM | POA: Diagnosis not present

## 2018-11-22 DIAGNOSIS — L720 Epidermal cyst: Secondary | ICD-10-CM | POA: Diagnosis not present

## 2018-11-22 DIAGNOSIS — R7303 Prediabetes: Secondary | ICD-10-CM | POA: Diagnosis not present

## 2018-11-22 DIAGNOSIS — L304 Erythema intertrigo: Secondary | ICD-10-CM | POA: Diagnosis not present

## 2018-11-22 DIAGNOSIS — L821 Other seborrheic keratosis: Secondary | ICD-10-CM | POA: Diagnosis not present

## 2018-11-22 DIAGNOSIS — I10 Essential (primary) hypertension: Secondary | ICD-10-CM | POA: Diagnosis not present

## 2018-11-22 DIAGNOSIS — D485 Neoplasm of uncertain behavior of skin: Secondary | ICD-10-CM | POA: Diagnosis not present

## 2018-11-28 DIAGNOSIS — M9903 Segmental and somatic dysfunction of lumbar region: Secondary | ICD-10-CM | POA: Diagnosis not present

## 2018-11-28 DIAGNOSIS — R51 Headache: Secondary | ICD-10-CM | POA: Diagnosis not present

## 2018-11-28 DIAGNOSIS — M9901 Segmental and somatic dysfunction of cervical region: Secondary | ICD-10-CM | POA: Diagnosis not present

## 2018-11-28 DIAGNOSIS — M9902 Segmental and somatic dysfunction of thoracic region: Secondary | ICD-10-CM | POA: Diagnosis not present

## 2018-11-29 DIAGNOSIS — I10 Essential (primary) hypertension: Secondary | ICD-10-CM | POA: Insufficient documentation

## 2018-11-29 DIAGNOSIS — E8881 Metabolic syndrome: Secondary | ICD-10-CM | POA: Insufficient documentation

## 2018-12-03 DIAGNOSIS — Z6841 Body Mass Index (BMI) 40.0 and over, adult: Secondary | ICD-10-CM | POA: Diagnosis not present

## 2018-12-03 DIAGNOSIS — Z713 Dietary counseling and surveillance: Secondary | ICD-10-CM | POA: Diagnosis not present

## 2018-12-06 DIAGNOSIS — F5081 Binge eating disorder: Secondary | ICD-10-CM | POA: Diagnosis not present

## 2018-12-10 DIAGNOSIS — N6011 Diffuse cystic mastopathy of right breast: Secondary | ICD-10-CM | POA: Diagnosis not present

## 2018-12-10 DIAGNOSIS — R928 Other abnormal and inconclusive findings on diagnostic imaging of breast: Secondary | ICD-10-CM | POA: Diagnosis not present

## 2018-12-20 DIAGNOSIS — F5081 Binge eating disorder: Secondary | ICD-10-CM | POA: Diagnosis not present

## 2018-12-21 ENCOUNTER — Ambulatory Visit (INDEPENDENT_AMBULATORY_CARE_PROVIDER_SITE_OTHER)
Admission: RE | Admit: 2018-12-21 | Discharge: 2018-12-21 | Disposition: A | Discharge status: Home / Self Care | Payer: BC Managed Care – PPO | Source: Ambulatory Visit

## 2018-12-21 DIAGNOSIS — N390 Urinary tract infection, site not specified: Secondary | ICD-10-CM

## 2018-12-21 DIAGNOSIS — R300 Dysuria: Secondary | ICD-10-CM | POA: Diagnosis not present

## 2018-12-21 DIAGNOSIS — R3915 Urgency of urination: Secondary | ICD-10-CM | POA: Diagnosis not present

## 2018-12-21 MED ORDER — NITROFURANTOIN MONOHYD MACRO 100 MG PO CAPS: 100.0000 mg | ORAL_CAPSULE | Freq: Two times a day (BID) | ORAL | 0 refills | Status: AC

## 2018-12-21 NOTE — Discharge Instructions (Signed)
Small frequent sips of fluids- Pedialyte, Gatorade, water, broth- to maintain hydration while having diarrhea.  Drink plenty of water to empty bladder regularly. Avoid alcohol and caffeine as these may irritate the bladder.   Complete course of antibiotics.  If no improvement or any worsening of symptoms you will need to be seen in person in order for a urine sample and likely culture to be obtained.  Please continue to follow with your primary care provider for recheck.

## 2018-12-21 NOTE — ED Provider Notes (Signed)
Virtual Visit via Video Note:  [name]  initiated request for Telemedicine visit with [facility] team. I connected with [name]  on 12/21/2018 at 4:09 PM  for a synchronized telemedicine visit using a video enabled HIPPA compliant telemedicine application. I verified that I am speaking with [name]  using two identifiers. Zigmund Gottron, NP  was physically located in a [facility]gent care site and [name] was located at a different location.   The limitations of evaluation and management by telemedicine as well as the availability of in-person appointments were discussed. Patient was informed that she  may incur a bill ( including co-pay) for this virtual visit encounter. [name]  expressed understanding and gave verbal consent to proceed with virtual visit.     History of Present Illness:[name]  is a 48 y.o. female presents with complaints of urinary symptoms. History of anal fistula and repair, cervical cancer with radiation and total hysterectomy, as well as colitis and IBS. Ate poor diet last night. Has had diarrhea due to her intake. Now with urgency and burning with urination. Urinary symptoms started today. Has had similar incidents in the past. No fevers. 1 month ago had the same issue. States she tends to eat the wrong food which triggers her diarrhea. Has never seen urology for urinary symptoms. Does have a PCP, has met with him virtually this year. No blood in urine or stool. Last month completed course of antibiotics which helped quickly. No back pain. No vaginal symptoms. No nausea or vomiting. Low abdominal cramping, resolves when has a BM. No other abdominal pain. No fevers.   Past Medical History:  Diagnosis Date  . 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 cervical ca  . Anal fistula   . Anxiety   . Cervical cancer [facility]) current oncologist -- dr Durwin Reges ([facility])/   previously dr Rayford Halsted 2 [facility]   dx 12/ 2014 invasive cervical cancer--- Stage IB2--- 06-11-2013  s/p  TAH w/ BSO and pelvic node dissection,  completed chemoradiation 04/ 2015  . Depression   . Endometriosis   . Fibroid   . Hemorrhoids   . History of cancer chemotherapy    FOR CERVICAL CANCER , COMPLETED 08-2013  . History of radiation therapy    FOR CERVICAL CANCER , COMPLETED 08-2013  . Hypercholesteremia   . Hypertension   . IBS (irritable bowel syndrome)   . Migraines   . Nodule of upper lobe of left lung    last CT chest 05-15-2017 (care everywhere in epic)   . OSA on CPAP   . 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 Urinary incontinence     Allergies  Allergen Reactions  . Amoxicillin Rash    Has patient had a PCN reaction causing immediate rash, facial/tongue/throat swelling, SOB or lightheadedness with hypotension: Unknown Has patient had a PCN reaction causing severe rash involving mucus membranes or skin necrosis: Yes Has patient had a PCN reaction that required hospitalization: No Has patient had a PCN reaction occurring within the last 10 years: No If all of the above answers are "NO", then may proceed with Cephalosporin use.   Marland Kitchen Penicillins Rash    Has patient had a PCN reaction causing immediate rash, facial/tongue/throat swelling, SOB or lightheadedness with hypotension: Unknown Has patient had a PCN reaction causing severe rash involving  mucus membranes or skin necrosis: Yes Has patient had a PCN reaction that required hospitalization: No Has patient had a PCN reaction occurring within the last 10 years: No If all of the above answers are "NO", then may proceed with Cephalosporin use.   . Sulfa Antibiotics Rash  . Garlic Diarrhea  . Onion Diarrhea        Observations/Objective: Alert, oriented, non toxic in appearance. Clear coherent speech without difficulty. No increased work of breathing visualized.    Assessment and  Plan: Will repeat course of macrobid at this time. Emphasized in person visit if persistent. Discussed that if recurrent further, as was seen for similar just 1 month ago, may request to be seen in person for physical exam and urine sampling. Precautions given. Patient verbalized understanding and agreeable to plan.     Follow Up Instructions:    I discussed the assessment and treatment plan with the patient. The patient was provided an opportunity to ask questions and all were answered. The patient agreed with the plan and demonstrated an understanding of the instructions.   The patient was advised to call back or seek an in-person evaluation if the symptoms worsen or if the condition fails to improve as anticipated.  I provided 15 minutes of non-face-to-face time during this encounter.    Zigmund Gottron, NP  12/21/2018 4:09 PM         Zigmund Gottron, NP 12/21/18 1705

## 2018-12-27 DIAGNOSIS — F5081 Binge eating disorder: Secondary | ICD-10-CM | POA: Diagnosis not present

## 2018-12-31 DIAGNOSIS — Z713 Dietary counseling and surveillance: Secondary | ICD-10-CM | POA: Diagnosis not present

## 2018-12-31 DIAGNOSIS — M9902 Segmental and somatic dysfunction of thoracic region: Secondary | ICD-10-CM | POA: Diagnosis not present

## 2018-12-31 DIAGNOSIS — R51 Headache: Secondary | ICD-10-CM | POA: Diagnosis not present

## 2018-12-31 DIAGNOSIS — M9903 Segmental and somatic dysfunction of lumbar region: Secondary | ICD-10-CM | POA: Diagnosis not present

## 2018-12-31 DIAGNOSIS — M9901 Segmental and somatic dysfunction of cervical region: Secondary | ICD-10-CM | POA: Diagnosis not present

## 2018-12-31 DIAGNOSIS — Z6841 Body Mass Index (BMI) 40.0 and over, adult: Secondary | ICD-10-CM | POA: Diagnosis not present

## 2019-01-01 DIAGNOSIS — G43719 Chronic migraine without aura, intractable, without status migrainosus: Secondary | ICD-10-CM | POA: Diagnosis not present

## 2019-01-03 DIAGNOSIS — F331 Major depressive disorder, recurrent, moderate: Secondary | ICD-10-CM | POA: Diagnosis not present

## 2019-01-03 DIAGNOSIS — F411 Generalized anxiety disorder: Secondary | ICD-10-CM | POA: Diagnosis not present

## 2019-01-07 DIAGNOSIS — M9902 Segmental and somatic dysfunction of thoracic region: Secondary | ICD-10-CM | POA: Diagnosis not present

## 2019-01-07 DIAGNOSIS — R51 Headache: Secondary | ICD-10-CM | POA: Diagnosis not present

## 2019-01-07 DIAGNOSIS — M9903 Segmental and somatic dysfunction of lumbar region: Secondary | ICD-10-CM | POA: Diagnosis not present

## 2019-01-07 DIAGNOSIS — M9901 Segmental and somatic dysfunction of cervical region: Secondary | ICD-10-CM | POA: Diagnosis not present

## 2019-01-10 DIAGNOSIS — G43719 Chronic migraine without aura, intractable, without status migrainosus: Secondary | ICD-10-CM | POA: Diagnosis not present

## 2019-01-10 DIAGNOSIS — M542 Cervicalgia: Secondary | ICD-10-CM | POA: Diagnosis not present

## 2019-01-11 DIAGNOSIS — M170 Bilateral primary osteoarthritis of knee: Secondary | ICD-10-CM | POA: Diagnosis not present

## 2019-01-11 DIAGNOSIS — M25532 Pain in left wrist: Secondary | ICD-10-CM | POA: Diagnosis not present

## 2019-01-14 ENCOUNTER — Other Ambulatory Visit: Payer: Self-pay

## 2019-01-14 DIAGNOSIS — R6889 Other general symptoms and signs: Secondary | ICD-10-CM | POA: Diagnosis not present

## 2019-01-14 DIAGNOSIS — Z20822 Contact with and (suspected) exposure to covid-19: Secondary | ICD-10-CM

## 2019-01-15 LAB — NOVEL CORONAVIRUS, NAA: SARS-CoV-2, NAA: NOT DETECTED

## 2019-01-16 DIAGNOSIS — G4710 Hypersomnia, unspecified: Secondary | ICD-10-CM | POA: Diagnosis not present

## 2019-01-16 DIAGNOSIS — N3000 Acute cystitis without hematuria: Secondary | ICD-10-CM | POA: Diagnosis not present

## 2019-01-16 DIAGNOSIS — G4733 Obstructive sleep apnea (adult) (pediatric): Secondary | ICD-10-CM | POA: Diagnosis not present

## 2019-01-17 DIAGNOSIS — F509 Eating disorder, unspecified: Secondary | ICD-10-CM | POA: Diagnosis not present

## 2019-01-22 DIAGNOSIS — Z6841 Body Mass Index (BMI) 40.0 and over, adult: Secondary | ICD-10-CM | POA: Diagnosis not present

## 2019-01-22 DIAGNOSIS — Z713 Dietary counseling and surveillance: Secondary | ICD-10-CM | POA: Diagnosis not present

## 2019-01-22 DIAGNOSIS — R51 Headache: Secondary | ICD-10-CM | POA: Diagnosis not present

## 2019-01-22 DIAGNOSIS — R635 Abnormal weight gain: Secondary | ICD-10-CM | POA: Diagnosis not present

## 2019-01-22 DIAGNOSIS — R7303 Prediabetes: Secondary | ICD-10-CM | POA: Diagnosis not present

## 2019-01-22 DIAGNOSIS — M9903 Segmental and somatic dysfunction of lumbar region: Secondary | ICD-10-CM | POA: Diagnosis not present

## 2019-01-22 DIAGNOSIS — M9901 Segmental and somatic dysfunction of cervical region: Secondary | ICD-10-CM | POA: Diagnosis not present

## 2019-01-22 DIAGNOSIS — M9902 Segmental and somatic dysfunction of thoracic region: Secondary | ICD-10-CM | POA: Diagnosis not present

## 2019-01-24 DIAGNOSIS — F509 Eating disorder, unspecified: Secondary | ICD-10-CM | POA: Diagnosis not present

## 2019-01-25 DIAGNOSIS — L03032 Cellulitis of left toe: Secondary | ICD-10-CM | POA: Diagnosis not present

## 2019-01-25 DIAGNOSIS — N3000 Acute cystitis without hematuria: Secondary | ICD-10-CM | POA: Diagnosis not present

## 2019-01-29 DIAGNOSIS — M9903 Segmental and somatic dysfunction of lumbar region: Secondary | ICD-10-CM | POA: Diagnosis not present

## 2019-01-29 DIAGNOSIS — M9902 Segmental and somatic dysfunction of thoracic region: Secondary | ICD-10-CM | POA: Diagnosis not present

## 2019-01-29 DIAGNOSIS — R51 Headache: Secondary | ICD-10-CM | POA: Diagnosis not present

## 2019-01-29 DIAGNOSIS — M9901 Segmental and somatic dysfunction of cervical region: Secondary | ICD-10-CM | POA: Diagnosis not present

## 2019-01-31 DIAGNOSIS — F411 Generalized anxiety disorder: Secondary | ICD-10-CM | POA: Diagnosis not present

## 2019-01-31 DIAGNOSIS — F509 Eating disorder, unspecified: Secondary | ICD-10-CM | POA: Diagnosis not present

## 2019-02-04 DIAGNOSIS — R7303 Prediabetes: Secondary | ICD-10-CM | POA: Diagnosis not present

## 2019-02-04 DIAGNOSIS — F5081 Binge eating disorder: Secondary | ICD-10-CM | POA: Diagnosis not present

## 2019-02-04 DIAGNOSIS — R635 Abnormal weight gain: Secondary | ICD-10-CM | POA: Diagnosis not present

## 2019-02-04 DIAGNOSIS — Z6841 Body Mass Index (BMI) 40.0 and over, adult: Secondary | ICD-10-CM | POA: Diagnosis not present

## 2019-02-05 DIAGNOSIS — M9903 Segmental and somatic dysfunction of lumbar region: Secondary | ICD-10-CM | POA: Diagnosis not present

## 2019-02-05 DIAGNOSIS — M9902 Segmental and somatic dysfunction of thoracic region: Secondary | ICD-10-CM | POA: Diagnosis not present

## 2019-02-05 DIAGNOSIS — M9901 Segmental and somatic dysfunction of cervical region: Secondary | ICD-10-CM | POA: Diagnosis not present

## 2019-02-05 DIAGNOSIS — R51 Headache: Secondary | ICD-10-CM | POA: Diagnosis not present

## 2019-02-11 DIAGNOSIS — Z6841 Body Mass Index (BMI) 40.0 and over, adult: Secondary | ICD-10-CM | POA: Diagnosis not present

## 2019-02-11 DIAGNOSIS — Z713 Dietary counseling and surveillance: Secondary | ICD-10-CM | POA: Diagnosis not present

## 2019-02-12 DIAGNOSIS — M9902 Segmental and somatic dysfunction of thoracic region: Secondary | ICD-10-CM | POA: Diagnosis not present

## 2019-02-12 DIAGNOSIS — M9903 Segmental and somatic dysfunction of lumbar region: Secondary | ICD-10-CM | POA: Diagnosis not present

## 2019-02-12 DIAGNOSIS — R51 Headache: Secondary | ICD-10-CM | POA: Diagnosis not present

## 2019-02-12 DIAGNOSIS — M9901 Segmental and somatic dysfunction of cervical region: Secondary | ICD-10-CM | POA: Diagnosis not present

## 2019-02-14 DIAGNOSIS — F4310 Post-traumatic stress disorder, unspecified: Secondary | ICD-10-CM | POA: Diagnosis not present

## 2019-02-14 DIAGNOSIS — F5081 Binge eating disorder: Secondary | ICD-10-CM | POA: Diagnosis not present

## 2019-02-14 DIAGNOSIS — F411 Generalized anxiety disorder: Secondary | ICD-10-CM | POA: Diagnosis not present

## 2019-02-19 DIAGNOSIS — M9903 Segmental and somatic dysfunction of lumbar region: Secondary | ICD-10-CM | POA: Diagnosis not present

## 2019-02-19 DIAGNOSIS — M9902 Segmental and somatic dysfunction of thoracic region: Secondary | ICD-10-CM | POA: Diagnosis not present

## 2019-02-19 DIAGNOSIS — R519 Headache, unspecified: Secondary | ICD-10-CM | POA: Diagnosis not present

## 2019-02-19 DIAGNOSIS — M9901 Segmental and somatic dysfunction of cervical region: Secondary | ICD-10-CM | POA: Diagnosis not present

## 2019-02-21 DIAGNOSIS — F411 Generalized anxiety disorder: Secondary | ICD-10-CM | POA: Diagnosis not present

## 2019-02-21 DIAGNOSIS — F4310 Post-traumatic stress disorder, unspecified: Secondary | ICD-10-CM | POA: Diagnosis not present

## 2019-02-21 DIAGNOSIS — F5081 Binge eating disorder: Secondary | ICD-10-CM | POA: Diagnosis not present

## 2019-02-28 DIAGNOSIS — F5081 Binge eating disorder: Secondary | ICD-10-CM | POA: Diagnosis not present

## 2019-02-28 DIAGNOSIS — F4310 Post-traumatic stress disorder, unspecified: Secondary | ICD-10-CM | POA: Diagnosis not present

## 2019-02-28 DIAGNOSIS — F411 Generalized anxiety disorder: Secondary | ICD-10-CM | POA: Diagnosis not present

## 2019-03-06 DIAGNOSIS — M9902 Segmental and somatic dysfunction of thoracic region: Secondary | ICD-10-CM | POA: Diagnosis not present

## 2019-03-06 DIAGNOSIS — M9901 Segmental and somatic dysfunction of cervical region: Secondary | ICD-10-CM | POA: Diagnosis not present

## 2019-03-06 DIAGNOSIS — R519 Headache, unspecified: Secondary | ICD-10-CM | POA: Diagnosis not present

## 2019-03-06 DIAGNOSIS — M9903 Segmental and somatic dysfunction of lumbar region: Secondary | ICD-10-CM | POA: Diagnosis not present

## 2019-03-07 DIAGNOSIS — F4310 Post-traumatic stress disorder, unspecified: Secondary | ICD-10-CM | POA: Diagnosis not present

## 2019-03-07 DIAGNOSIS — F411 Generalized anxiety disorder: Secondary | ICD-10-CM | POA: Diagnosis not present

## 2019-03-07 DIAGNOSIS — F5081 Binge eating disorder: Secondary | ICD-10-CM | POA: Diagnosis not present

## 2019-03-12 DIAGNOSIS — F5081 Binge eating disorder: Secondary | ICD-10-CM | POA: Diagnosis not present

## 2019-03-12 DIAGNOSIS — R7303 Prediabetes: Secondary | ICD-10-CM | POA: Diagnosis not present

## 2019-03-12 DIAGNOSIS — F4320 Adjustment disorder, unspecified: Secondary | ICD-10-CM | POA: Diagnosis not present

## 2019-03-12 DIAGNOSIS — E669 Obesity, unspecified: Secondary | ICD-10-CM | POA: Diagnosis not present

## 2019-03-12 DIAGNOSIS — Z6839 Body mass index (BMI) 39.0-39.9, adult: Secondary | ICD-10-CM | POA: Diagnosis not present

## 2019-03-14 DIAGNOSIS — F5081 Binge eating disorder: Secondary | ICD-10-CM | POA: Diagnosis not present

## 2019-03-14 DIAGNOSIS — F4310 Post-traumatic stress disorder, unspecified: Secondary | ICD-10-CM | POA: Diagnosis not present

## 2019-03-14 DIAGNOSIS — F411 Generalized anxiety disorder: Secondary | ICD-10-CM | POA: Diagnosis not present

## 2019-03-19 DIAGNOSIS — F4320 Adjustment disorder, unspecified: Secondary | ICD-10-CM | POA: Diagnosis not present

## 2019-03-21 DIAGNOSIS — F411 Generalized anxiety disorder: Secondary | ICD-10-CM | POA: Diagnosis not present

## 2019-03-21 DIAGNOSIS — F5081 Binge eating disorder: Secondary | ICD-10-CM | POA: Diagnosis not present

## 2019-03-21 DIAGNOSIS — F4310 Post-traumatic stress disorder, unspecified: Secondary | ICD-10-CM | POA: Diagnosis not present

## 2019-03-25 DIAGNOSIS — R519 Headache, unspecified: Secondary | ICD-10-CM | POA: Diagnosis not present

## 2019-03-25 DIAGNOSIS — M9901 Segmental and somatic dysfunction of cervical region: Secondary | ICD-10-CM | POA: Diagnosis not present

## 2019-03-25 DIAGNOSIS — M9903 Segmental and somatic dysfunction of lumbar region: Secondary | ICD-10-CM | POA: Diagnosis not present

## 2019-03-25 DIAGNOSIS — M9902 Segmental and somatic dysfunction of thoracic region: Secondary | ICD-10-CM | POA: Diagnosis not present

## 2019-03-26 DIAGNOSIS — F4320 Adjustment disorder, unspecified: Secondary | ICD-10-CM | POA: Diagnosis not present

## 2019-03-28 DIAGNOSIS — F5081 Binge eating disorder: Secondary | ICD-10-CM | POA: Diagnosis not present

## 2019-03-28 DIAGNOSIS — M9901 Segmental and somatic dysfunction of cervical region: Secondary | ICD-10-CM | POA: Diagnosis not present

## 2019-03-28 DIAGNOSIS — R519 Headache, unspecified: Secondary | ICD-10-CM | POA: Diagnosis not present

## 2019-03-28 DIAGNOSIS — F4310 Post-traumatic stress disorder, unspecified: Secondary | ICD-10-CM | POA: Diagnosis not present

## 2019-03-28 DIAGNOSIS — F411 Generalized anxiety disorder: Secondary | ICD-10-CM | POA: Diagnosis not present

## 2019-03-28 DIAGNOSIS — M9903 Segmental and somatic dysfunction of lumbar region: Secondary | ICD-10-CM | POA: Diagnosis not present

## 2019-03-28 DIAGNOSIS — M9902 Segmental and somatic dysfunction of thoracic region: Secondary | ICD-10-CM | POA: Diagnosis not present

## 2019-04-01 DIAGNOSIS — R519 Headache, unspecified: Secondary | ICD-10-CM | POA: Diagnosis not present

## 2019-04-01 DIAGNOSIS — M9903 Segmental and somatic dysfunction of lumbar region: Secondary | ICD-10-CM | POA: Diagnosis not present

## 2019-04-01 DIAGNOSIS — M9902 Segmental and somatic dysfunction of thoracic region: Secondary | ICD-10-CM | POA: Diagnosis not present

## 2019-04-01 DIAGNOSIS — M9901 Segmental and somatic dysfunction of cervical region: Secondary | ICD-10-CM | POA: Diagnosis not present

## 2019-04-02 DIAGNOSIS — F4320 Adjustment disorder, unspecified: Secondary | ICD-10-CM | POA: Diagnosis not present

## 2019-04-05 DIAGNOSIS — G43719 Chronic migraine without aura, intractable, without status migrainosus: Secondary | ICD-10-CM | POA: Diagnosis not present

## 2019-04-09 DIAGNOSIS — F411 Generalized anxiety disorder: Secondary | ICD-10-CM | POA: Diagnosis not present

## 2019-04-09 DIAGNOSIS — M9901 Segmental and somatic dysfunction of cervical region: Secondary | ICD-10-CM | POA: Diagnosis not present

## 2019-04-09 DIAGNOSIS — M9902 Segmental and somatic dysfunction of thoracic region: Secondary | ICD-10-CM | POA: Diagnosis not present

## 2019-04-09 DIAGNOSIS — R519 Headache, unspecified: Secondary | ICD-10-CM | POA: Diagnosis not present

## 2019-04-09 DIAGNOSIS — N390 Urinary tract infection, site not specified: Secondary | ICD-10-CM | POA: Diagnosis not present

## 2019-04-09 DIAGNOSIS — F4310 Post-traumatic stress disorder, unspecified: Secondary | ICD-10-CM | POA: Diagnosis not present

## 2019-04-09 DIAGNOSIS — F5081 Binge eating disorder: Secondary | ICD-10-CM | POA: Diagnosis not present

## 2019-04-09 DIAGNOSIS — M9903 Segmental and somatic dysfunction of lumbar region: Secondary | ICD-10-CM | POA: Diagnosis not present

## 2019-04-10 DIAGNOSIS — G4710 Hypersomnia, unspecified: Secondary | ICD-10-CM | POA: Diagnosis not present

## 2019-04-10 DIAGNOSIS — G4733 Obstructive sleep apnea (adult) (pediatric): Secondary | ICD-10-CM | POA: Diagnosis not present

## 2019-04-10 DIAGNOSIS — G43719 Chronic migraine without aura, intractable, without status migrainosus: Secondary | ICD-10-CM | POA: Diagnosis not present

## 2019-04-10 DIAGNOSIS — M542 Cervicalgia: Secondary | ICD-10-CM | POA: Diagnosis not present

## 2019-04-15 DIAGNOSIS — R519 Headache, unspecified: Secondary | ICD-10-CM | POA: Diagnosis not present

## 2019-04-15 DIAGNOSIS — M9901 Segmental and somatic dysfunction of cervical region: Secondary | ICD-10-CM | POA: Diagnosis not present

## 2019-04-15 DIAGNOSIS — M9902 Segmental and somatic dysfunction of thoracic region: Secondary | ICD-10-CM | POA: Diagnosis not present

## 2019-04-15 DIAGNOSIS — M9903 Segmental and somatic dysfunction of lumbar region: Secondary | ICD-10-CM | POA: Diagnosis not present

## 2019-04-16 DIAGNOSIS — G4710 Hypersomnia, unspecified: Secondary | ICD-10-CM | POA: Diagnosis not present

## 2019-04-16 DIAGNOSIS — G4733 Obstructive sleep apnea (adult) (pediatric): Secondary | ICD-10-CM | POA: Diagnosis not present

## 2019-04-17 NOTE — H&P (Addendum)
TOTAL KNEE ADMISSION H&P  Patient is being admitted for left total knee arthroplasty.  Subjective:  Chief Complaint:left knee pain.  HPI: [name], 48 y.o. female, has a history of pain and functional disability in the left knee due to arthritis and has failed non-surgical conservative treatments for greater than 12 weeks to includecorticosteriod injections, viscosupplementation injections and activity modification.  Onset of symptoms was gradual, starting 1 years ago with gradually worsening course since that time. The patient noted no past surgery on the left knee(s).  Patient currently rates pain in the left knee(s) at 8 out of 10 with activity. Patient has worsening of pain with activity and weight bearing, crepitus, joint swelling and instability.  Patient has evidence of bone-on-bone in the medial compartment of the left knee with near bone-on-bone within the patellofemoral compartment by imaging studies. There is no active infection.  Patient Active Problem List   Diagnosis Date Noted  . Vitamin D insufficiency 08/28/2017  . Perirectal abscess 04/26/2017  . 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 Obesity, Class II, BMI 35-39.9 02/27/2017  . OSA (obstructive sleep apnea) 03/13/2015  . Morbid obesity ([place]) 03/13/2015   Past Medical History:  Diagnosis Date  . 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 cervical ca  . Anal fistula   . Anxiety   . Cervical cancer [facility]) current oncologist -- dr Durwin Reges ([facility])/  previously dr Rayford Halsted 2 [facility]   dx 12/ 2014 invasive cervical cancer--- Stage IB2--- 06-11-2013  s/p  TAH w/ BSO and pelvic node dissection,  completed chemoradiation 04/ 2015  . Depression   . Endometriosis   . Fibroid   . Hemorrhoids   . History of cancer chemotherapy    FOR CERVICAL CANCER , COMPLETED 08-2013  . History of radiation therapy    FOR CERVICAL CANCER , COMPLETED 08-2013  . Hypercholesteremia    . Hypertension   . IBS (irritable bowel syndrome)   . Migraines   . Nodule of upper lobe of left lung    last CT chest 05-15-2017 (care everywhere in epic)   . OSA on CPAP   . 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 Urinary incontinence     Past Surgical History:  Procedure Laterality Date  . ABDOMINAL HYSTERECTOMY  06/11/2013   TAH/BSO  . BREAST SURGERY Right 11/25/2008   2 benign lumps  . COLONOSCOPY    . COLPOSCOPY    . EVALUATION UNDER ANESTHESIA WITH ANAL FISTULECTOMY N/A 07/06/2017   Procedure: ANAL EXAM UNDER ANESTHESIA;  Surgeon: Leighton Ruff, MD;  Location: [facility];  Service: General;  Laterality: N/A;  . INCISION AND DRAINAGE PERIRECTAL ABSCESS N/A 04/26/2017   Procedure: IRRIGATION AND DEBRIDEMENT PERIRECTAL ABSCESS;  Surgeon: Johnathan Hausen, MD;  Location: [facility];  Service: General;  Laterality: N/A;  . LAPAROSCOPY INCISIONAL HERNIA REPAIR W/ MESH, LYSIS ADHESIONS  01-10-2014   @[facility]   . LIGATION OF INTERNAL FISTULA TRACT N/A 03/08/2018   Procedure: LIGATION OF INTERNAL FISTULA TRACT;  Surgeon: Leighton Ruff, MD;  Location: [facility];  Service: General;  Laterality: N/A;  . [place] N/A 07/06/2017   Procedure: PLACEMENT OF SETON;  Surgeon: Leighton Ruff, MD;  Location: [facility];  Service: General;  Laterality: N/A;  . TOTAL ABDOMINAL HYSTERECTOMY W/ BILATERAL SALPINGOOPHORECTOMY  06-11-2013    dr Rayford Halsted @[facility]    W/ [place]  .  WISDOM TOOTH EXTRACTION      No current facility-administered medications for this encounter.    Current Outpatient Medications  Medication Sig Dispense Refill Last Dose  . amLODipine (NORVASC) 5 MG tablet Take 5 mg by mouth daily.   Taking  . aspirin-acetaminophen-caffeine (EXCEDRIN MIGRAINE) 250-250-65 MG tablet Take 3 tablets by mouth every 6 (six) hours as needed for headache.   Taking  . atorvastatin (LIPITOR) 10 MG tablet Take 10 mg  by mouth daily at 6 PM.   Taking  . baclofen (LIORESAL) 10 MG tablet Take 1 tablet by mouth as needed.     . chlorproMAZINE (THORAZINE) 25 MG tablet Take 1 tablet by mouth as needed.     . cholecalciferol (VITAMIN D) 1000 UNITS tablet Take 1,000 Units by mouth daily.   Taking  . desvenlafaxine (PRISTIQ) 100 MG 24 hr tablet Take 1 tablet by mouth daily.  1   . diphenhydramine-acetaminophen (TYLENOL PM) 25-500 MG TABS tablet Take 2 tablets by mouth at bedtime as needed (for sleep/pain).   Taking  . Multiple Vitamin (MULTIVITAMIN WITH MINERALS) TABS tablet Take 1 tablet by mouth daily.   03/07/2018 at Unknown time   Allergies  Allergen Reactions  . Amoxicillin Rash    Has patient had a PCN reaction causing immediate rash, facial/tongue/throat swelling, SOB or lightheadedness with hypotension: Unknown Has patient had a PCN reaction causing severe rash involving mucus membranes or skin necrosis: Yes Has patient had a PCN reaction that required hospitalization: No Has patient had a PCN reaction occurring within the last 10 years: No If all of the above answers are "NO", then may proceed with Cephalosporin use.   Marland Kitchen Penicillins Rash    Has patient had a PCN reaction causing immediate rash, facial/tongue/throat swelling, SOB or lightheadedness with hypotension: Unknown Has patient had a PCN reaction causing severe rash involving mucus membranes or skin necrosis: Yes Has patient had a PCN reaction that required hospitalization: No Has patient had a PCN reaction occurring within the last 10 years: No If all of the above answers are "NO", then may proceed with Cephalosporin use.   . Sulfa Antibiotics Rash  . Garlic Diarrhea  . Onion Diarrhea    Social History   Tobacco Use  . Smoking status: Former Smoker    Quit date: 04/26/1998    Years since quitting: 20.9  . Smokeless tobacco: Never Used  Substance Use Topics  . Alcohol use: No    Alcohol/week: 0.0 standard drinks    Comment: social     Family History  Problem Relation Age of Onset  . Hyperlipidemia Mother   . Cancer Father 26       bladder ca  . Diabetes Father   . Diabetes Maternal Grandfather   . Stroke Maternal Grandfather      Review of Systems  Constitutional: Negative for chills and fever.  HENT: Negative for congestion, sore throat and tinnitus.   Eyes: Negative for double vision, photophobia and pain.  Respiratory: Negative for cough, shortness of breath and wheezing.   Cardiovascular: Negative for chest pain, palpitations and orthopnea.  Gastrointestinal: Negative for heartburn, nausea and vomiting.  Genitourinary: Negative for dysuria, frequency and urgency.  Musculoskeletal: Positive for joint pain.  Neurological: Negative for dizziness, weakness and headaches.    Objective:  Physical Exam  Well nourished and well developed.  General: Alert and oriented x3, cooperative and pleasant, no acute distress.  Head: normocephalic, atraumatic, neck supple.  Eyes: EOMI.  Respiratory: breath sounds clear  in all fields, no wheezing, rales, or rhonchi. Cardiovascular: Regular rate and rhythm, no murmurs, gallops or rubs.  Abdomen: non-tender to palpation and soft, normoactive bowel sounds. Musculoskeletal:  Left Knee Exam: Moderate effusion. Range of motion is 10-115 degrees. Moderate crepitus on range of motion of the knee. Significant medial joint line tenderness No lateral joint line tenderness. Stable knee.  Calves soft and nontender. Motor function intact in LE. Strength 5/5 LE bilaterally. Neuro: Distal pulses 2+. Sensation to light touch intact in LE.  Vital signs in last 24 hours: Blood pressure: 132/86 mmHg Pulse: 72 bpm  Labs:   Estimated body mass index is 41.24 kg/m as calculated from the following:   Height as of 05/23/18: 5' 4.5" (1.638 m).   Weight as of 05/23/18: 110.7 kg.   Imaging Review Plain radiographs demonstrate severe degenerative joint disease of the left knee(s). The  overall alignment isneutral. The bone quality appears to be adequate for age and reported activity level.  Assessment/Plan:  End stage arthritis, left knee   The patient history, physical examination, clinical judgment of the provider and imaging studies are consistent with end stage degenerative joint disease of the left knee(s) and total knee arthroplasty is deemed medically necessary. The treatment options including medical management, injection therapy arthroscopy and arthroplasty were discussed at length. The risks and benefits of total knee arthroplasty were presented and reviewed. The risks due to aseptic loosening, infection, stiffness, patella tracking problems, thromboembolic complications and other imponderables were discussed. The patient acknowledged the explanation, agreed to proceed with the plan and consent was signed. Patient is being admitted for inpatient treatment for surgery, pain control, PT, OT, prophylactic antibiotics, VTE prophylaxis, progressive ambulation and ADL's and discharge planning. The patient is planning to be discharged home.  Anticipated LOS equal to or greater than 2 midnights due to - Age 41 and older with one or more of the following:  - Obesity  - Expected need for hospital services (PT, OT, Nursing) required for safe  discharge  - Anticipated need for postoperative skilled nursing care or inpatient rehab  - Active co-morbidities: None OR   - Unanticipated findings during/Post Surgery: None  - Patient is a high risk of re-admission due to: None  Therapy Plans: Outpatient therapy at [facility]) Disposition: Home with mom, sister, and daughter Planned DVT Prophylaxis: Aspirin 325 mg BID DME needed: 3-in-1 PCP: Pati Gallo, MD TXA: IV Allergies: PCN (hives); sulfa (hives); tylenol (diarrhea) Anesthesia Concerns: None BMI: 38.9  Other: Pt does not want tylenol Hx cervical cancer > 5 years ago  - Patient was  instructed on what medications to stop prior to surgery. - Follow-up visit in 2 weeks with Dr. Wynelle Link - Begin physical therapy following surgery - Pre-operative lab work as pre-surgical testing - Prescriptions will be provided in hospital at time of discharge  Theresa Duty, PA-C Orthopedic Surgery [facility] [place]

## 2019-04-18 DIAGNOSIS — F4310 Post-traumatic stress disorder, unspecified: Secondary | ICD-10-CM | POA: Diagnosis not present

## 2019-04-18 DIAGNOSIS — R519 Headache, unspecified: Secondary | ICD-10-CM | POA: Diagnosis not present

## 2019-04-18 DIAGNOSIS — M9902 Segmental and somatic dysfunction of thoracic region: Secondary | ICD-10-CM | POA: Diagnosis not present

## 2019-04-18 DIAGNOSIS — F411 Generalized anxiety disorder: Secondary | ICD-10-CM | POA: Diagnosis not present

## 2019-04-18 DIAGNOSIS — M9901 Segmental and somatic dysfunction of cervical region: Secondary | ICD-10-CM | POA: Diagnosis not present

## 2019-04-18 DIAGNOSIS — M9903 Segmental and somatic dysfunction of lumbar region: Secondary | ICD-10-CM | POA: Diagnosis not present

## 2019-04-22 NOTE — Patient Instructions (Addendum)
DUE TO COVID-19 ONLY ONE VISITOR IS ALLOWED TO COME WITH YOU AND STAY IN THE WAITING ROOM ONLY DURING PRE OP AND PROCEDURE DAY OF SURGERY. THE 1 VISITOR MAY VISIT WITH YOU AFTER SURGERY IN YOUR PRIVATE ROOM DURING VISITING HOURS ONLY!  YOU NEED TO HAVE A COVID 19 TEST ON__12-10-20 _____ @_______ , THIS TEST MUST BE DONE BEFORE SURGERY, COME  [place], [place] , [id number].  ([place]) ONCE YOUR COVID TEST IS COMPLETED, PLEASE BEGIN THE QUARANTINE INSTRUCTIONS AS OUTLINED IN YOUR HANDOUT.                [name] [facility]  04/22/2019   Your procedure is scheduled on: 04-29-19   Report to [facility] Main  Entrance   Report to short stay  at       0530 AM     Call this number if you have problems the morning of surgery [phone]    Remember: NO SOLID FOOD AFTER MIDNIGHT THE NIGHT PRIOR TO SURGERY. NOTHING BY MOUTH EXCEPT CLEAR LIQUIDS UNTIL    0415 am . PLEASE FINISH ENSURE DRINK PER SURGEON ORDER  WHICH NEEDS TO BE COMPLETED AT      [place] am then nothing by mouth.    CLEAR LIQUID DIET   Foods Allowed                                                                     Foods Excluded  Coffee and tea, regular and decaf                             liquids that you cannot  Plain Jell-O any favor except red or purple                                           see through such as: Fruit ices (not with fruit pulp)                                     milk, soups, orange juice  Iced Popsicles                                    All solid food Carbonated beverages, regular and diet                                    Cranberry, grape and apple juices Sports drinks like Gatorade Lightly seasoned clear broth or consume(fat free) Sugar, honey syrup  _____________________________________________________________________    BRUSH YOUR TEETH MORNING OF SURGERY AND RINSE YOUR MOUTH OUT, NO CHEWING GUM CANDY OR MINTS.     Take these medicines the morning of  surgery with A SIP OF WATER: amlodipine  You may not have any metal on your body including hair pins and              piercings  Do not wear jewelry, make-up, lotions, powders or perfumes, deodorant             Do not wear nail polish on your fingernails.  Do not shave  48 hours prior to surgery.     Do not bring valuables to the hospital. [place].  Contacts, dentures or bridgework may not be worn into surgery.   Name and phone number of your driver:  Special Instructions: N/A              Please read over the following fact sheets you were given: _____________________________________________________________________           [facility] - Preparing for Surgery Before surgery, you can play an important role.  Because skin is not sterile, your skin needs to be as free of germs as possible.  You can reduce the number of germs on your skin by washing with CHG (chlorahexidine gluconate) soap before surgery.  CHG is an antiseptic cleaner which kills germs and bonds with the skin to continue killing germs even after washing. Please DO NOT use if you have an allergy to CHG or antibacterial soaps.  If your skin becomes reddened/irritated stop using the CHG and inform your nurse when you arrive at Short Stay. Do not shave (including legs and underarms) for at least 48 hours prior to the first CHG shower.  You may shave your face/neck. Please follow these instructions carefully:  1.  Shower with CHG Soap the night before surgery and the  morning of Surgery.  2.  If you choose to wash your hair, wash your hair first as usual with your  normal  shampoo.  3.  After you shampoo, rinse your hair and body thoroughly to remove the  shampoo.                           4.  Use CHG as you would any other liquid soap.  You can apply chg directly  to the skin and wash                       Gently with a scrungie or clean  washcloth.  5.  Apply the CHG Soap to your body ONLY FROM THE NECK DOWN.   Do not use on face/ open                           Wound or open sores. Avoid contact with eyes, ears mouth and genitals (private parts).                       Wash face,  Genitals (private parts) with your normal soap.             6.  Wash thoroughly, paying special attention to the area where your surgery  will be performed.  7.  Thoroughly rinse your body with warm water from the neck down.  8.  DO NOT shower/wash with your normal soap after using and rinsing off  the CHG Soap.  9.  Pat yourself dry with a clean towel.            10.  Wear clean pajamas.            11.  Place clean sheets on your bed the night of your first shower and do not  sleep with pets. Day of Surgery : Do not apply any lotions/deodorants the morning of surgery.  Please wear clean clothes to the hospital/surgery center.  FAILURE TO FOLLOW THESE INSTRUCTIONS MAY RESULT IN THE CANCELLATION OF YOUR SURGERY PATIENT SIGNATURE_________________________________  NURSE SIGNATURE__________________________________  ________________________________________________________________________   [name]  An incentive spirometer is a tool that can help keep your lungs clear and active. This tool measures how well you are filling your lungs with each breath. Taking long deep breaths may help reverse or decrease the chance of developing breathing (pulmonary) problems (especially infection) following:  A long period of time when you are unable to move or be active. BEFORE THE PROCEDURE   If the spirometer includes an indicator to show your best effort, your nurse or respiratory therapist will set it to a desired goal.  If possible, sit up straight or lean slightly forward. Try not to slouch.  Hold the incentive spirometer in an upright position. INSTRUCTIONS FOR USE  1. Sit on the edge of your bed if possible, or sit up as far  as you can in bed or on a chair. 2. Hold the incentive spirometer in an upright position. 3. Breathe out normally. 4. Place the mouthpiece in your mouth and seal your lips tightly around it. 5. Breathe in slowly and as deeply as possible, raising the piston or the ball toward the top of the column. 6. Hold your breath for 3-5 seconds or for as long as possible. Allow the piston or ball to fall to the bottom of the column. 7. Remove the mouthpiece from your mouth and breathe out normally. 8. Rest for a few seconds and repeat Steps 1 through 7 at least 10 times every 1-2 hours when you are awake. Take your time and take a few normal breaths between deep breaths. 9. The spirometer may include an indicator to show your best effort. Use the indicator as a goal to work toward during each repetition. 10. After each set of 10 deep breaths, practice coughing to be sure your lungs are clear. If you have an incision (the cut made at the time of surgery), support your incision when coughing by placing a pillow or rolled up towels firmly against it. Once you are able to get out of bed, walk around indoors and cough well. You may stop using the incentive spirometer when instructed by your caregiver.  RISKS AND COMPLICATIONS  Take your time so you do not get dizzy or light-headed.  If you are in pain, you may need to take or ask for pain medication before doing incentive spirometry. It is harder to take a deep breath if you are having pain. AFTER USE  Rest and breathe slowly and easily.  It can be helpful to keep track of a log of your progress. Your caregiver can provide you with a simple table to help with this. If you are using the spirometer at home, follow these instructions: [place] IF:   You are having difficultly using the spirometer.  You have trouble using the spirometer as often as instructed.  Your pain medication is not giving enough relief while using the spirometer.  You  develop fever of 100.5 F (38.1 C) or higher. SEEK IMMEDIATE MEDICAL CARE IF:   You cough up bloody sputum that had not been present before.  You develop fever of 102 F (38.9 C) or greater.  You develop worsening pain at or near the incision site. MAKE SURE YOU:   Understand these instructions.  Will watch your condition.  Will get help right away if you are not doing well or get worse. Document Released: 09/12/2006 Document Revised: 07/25/2011 Document Reviewed: 11/13/2006 ExitCare Patient Information 2014 ExitCare, [place].   ________________________________________________________________________  WHAT IS A BLOOD TRANSFUSION? Blood Transfusion Information  A transfusion is the replacement of blood or some of its parts. Blood is made up of multiple cells which provide different functions.  Red blood cells carry oxygen and are used for blood loss replacement.  White blood cells fight against infection.  Platelets control bleeding.  Plasma helps clot blood.  Other blood products are available for specialized needs, such as hemophilia or other clotting disorders. BEFORE THE TRANSFUSION  Who gives blood for transfusions?   Healthy volunteers who are fully evaluated to make sure their blood is safe. This is blood bank blood. Transfusion therapy is the safest it has ever been in the practice of medicine. Before blood is taken from a donor, a complete history is taken to make sure that person has no history of diseases nor engages in risky social behavior (examples are intravenous drug use or sexual activity with multiple partners). The donor's travel history is screened to minimize risk of transmitting infections, such as malaria. The donated blood is tested for signs of infectious diseases, such as HIV and hepatitis. The blood is then tested to be sure it is compatible with you in order to minimize the chance of a transfusion reaction. If you or a relative donates blood, this is  often done in anticipation of surgery and is not appropriate for emergency situations. It takes many days to process the donated blood. RISKS AND COMPLICATIONS Although transfusion therapy is very safe and saves many lives, the main dangers of transfusion include:   Getting an infectious disease.  Developing a transfusion reaction. This is an allergic reaction to something in the blood you were given. Every precaution is taken to prevent this. The decision to have a blood transfusion has been considered carefully by your caregiver before blood is given. Blood is not given unless the benefits outweigh the risks. AFTER THE TRANSFUSION  Right after receiving a blood transfusion, you will usually feel much better and more energetic. This is especially true if your red blood cells have gotten low (anemic). The transfusion raises the level of the red blood cells which carry oxygen, and this usually causes an energy increase.  The nurse administering the transfusion will monitor you carefully for complications. HOME CARE INSTRUCTIONS  No special instructions are needed after a transfusion. You may find your energy is better. Speak with your caregiver about any limitations on activity for underlying diseases you may have. SEEK MEDICAL CARE IF:   Your condition is not improving after your transfusion.  You develop redness or irritation at the intravenous (IV) site. SEEK IMMEDIATE MEDICAL CARE IF:  Any of the following symptoms occur over the next 12 hours:  Shaking chills.  You have a temperature by mouth above 102 F (38.9 C), not controlled by medicine.  Chest, back, or muscle pain.  People around you feel you are not acting correctly or are confused.  Shortness of breath  or difficulty breathing.  Dizziness and fainting.  You get a rash or develop hives.  You have a decrease in urine output.  Your urine turns a dark color or changes to pink, red, or brown. Any of the following  symptoms occur over the next 10 days:  You have a temperature by mouth above 102 F (38.9 C), not controlled by medicine.  Shortness of breath.  Weakness after normal activity.  The white part of the eye turns yellow (jaundice).  You have a decrease in the amount of urine or are urinating less often.  Your urine turns a dark color or changes to pink, red, or brown. Document Released: 04/29/2000 Document Revised: 07/25/2011 Document Reviewed: 12/17/2007 [facility] Patient Information 2014 [place], [place].  _______________________________________________________________________

## 2019-04-22 NOTE — Progress Notes (Signed)
PCP - Renee Ramus  Clearance 01-15-19 on chart  Cardiologist -   Chest x-ray -  EKG - 04-25-19 epic Stress Test -  ECHO -  Cardiac Cath -   Sleep Study -  CPAP - yes  Fasting Blood Sugar -  Checks Blood Sugar _____ times a day  Blood Thinner Instructions: Aspirin Instructions: Last Dose:  Anesthesia review: OSA cpap  Patient denies shortness of breath, fever, cough and chest pain at PAT appointment  NONE   Patient verbalized understanding of instructions that were given to them at the PAT appointment. Patient was also instructed that they will need to review over the PAT instructions again at home before surgery. n

## 2019-04-23 DIAGNOSIS — F4320 Adjustment disorder, unspecified: Secondary | ICD-10-CM | POA: Diagnosis not present

## 2019-04-25 ENCOUNTER — Encounter (HOSPITAL_COMMUNITY): Payer: Self-pay

## 2019-04-25 ENCOUNTER — Other Ambulatory Visit: Payer: Self-pay

## 2019-04-25 ENCOUNTER — Encounter (HOSPITAL_COMMUNITY)
Admission: RE | Admit: 2019-04-25 | Discharge: 2019-04-25 | Disposition: A | Discharge status: Home / Self Care | Payer: BC Managed Care – PPO | Source: Ambulatory Visit | Attending: Orthopedic Surgery | Admitting: Orthopedic Surgery

## 2019-04-25 ENCOUNTER — Other Ambulatory Visit (HOSPITAL_COMMUNITY)
Admission: RE | Admit: 2019-04-25 | Discharge: 2019-04-25 | Disposition: A | Discharge status: Home / Self Care | Payer: BC Managed Care – PPO | Source: Ambulatory Visit | Attending: Orthopedic Surgery | Admitting: Orthopedic Surgery

## 2019-04-25 DIAGNOSIS — R9431 Abnormal electrocardiogram [ECG] [EKG]: Secondary | ICD-10-CM | POA: Insufficient documentation

## 2019-04-25 DIAGNOSIS — F5081 Binge eating disorder: Secondary | ICD-10-CM | POA: Diagnosis not present

## 2019-04-25 DIAGNOSIS — F411 Generalized anxiety disorder: Secondary | ICD-10-CM | POA: Diagnosis not present

## 2019-04-25 DIAGNOSIS — M1712 Unilateral primary osteoarthritis, left knee: Secondary | ICD-10-CM | POA: Insufficient documentation

## 2019-04-25 DIAGNOSIS — Z01810 Encounter for preprocedural cardiovascular examination: Secondary | ICD-10-CM | POA: Diagnosis not present

## 2019-04-25 DIAGNOSIS — Z20828 Contact with and (suspected) exposure to other viral communicable diseases: Secondary | ICD-10-CM | POA: Diagnosis not present

## 2019-04-25 DIAGNOSIS — F4310 Post-traumatic stress disorder, unspecified: Secondary | ICD-10-CM | POA: Diagnosis not present

## 2019-04-25 DIAGNOSIS — Z01812 Encounter for preprocedural laboratory examination: Secondary | ICD-10-CM | POA: Insufficient documentation

## 2019-04-25 LAB — COMPREHENSIVE METABOLIC PANEL
ALT: 51 U/L — ABNORMAL HIGH (ref 0–44)
AST: 26 U/L (ref 15–41)
Albumin: 4.4 g/dL (ref 3.5–5.0)
Alkaline Phosphatase: 107 U/L (ref 38–126)
Anion gap: 9 (ref 5–15)
BUN: 17 mg/dL (ref 6–20)
CO2: 29 mmol/L (ref 22–32)
Calcium: 9.2 mg/dL (ref 8.9–10.3)
Chloride: 104 mmol/L (ref 98–111)
Creatinine, Ser: 0.7 mg/dL (ref 0.44–1.00)
GFR calc Af Amer: >60 mL/min (ref >60)
GFR calc non Af Amer: >60 mL/min (ref >60)
Glucose, Bld: 93 mg/dL (ref 70–99)
Potassium: 3.8 mmol/L (ref 3.5–5.1)
Sodium: 142 mmol/L (ref 135–145)
Total Bilirubin: 2 mg/dL — ABNORMAL HIGH (ref 0.3–1.2)
Total Protein: 8 g/dL (ref 6.5–8.1)

## 2019-04-25 LAB — CBC
HCT: 43.6 % (ref 36.0–46.0)
Hemoglobin: 14.3 g/dL (ref 12.0–15.0)
MCH: 30.4 pg (ref 26.0–34.0)
MCHC: 32.8 g/dL (ref 30.0–36.0)
MCV: 92.8 fL (ref 80.0–100.0)
Platelets: 300 10*3/uL (ref 150–400)
RBC: 4.7 MIL/uL (ref 3.87–5.11)
RDW: 12.8 % (ref 11.5–15.5)
WBC: 7.8 10*3/uL (ref 4.0–10.5)
nRBC: 0 % (ref 0.0–0.2)

## 2019-04-25 LAB — SURGICAL PCR SCREEN
MRSA, PCR: NEGATIVE
Staphylococcus aureus: POSITIVE — AB

## 2019-04-25 LAB — PROTIME-INR
INR: 1 (ref 0.8–1.2)
Prothrombin Time: 12.8 seconds (ref 11.4–15.2)

## 2019-04-25 LAB — APTT: aPTT: 32 seconds (ref 24–36)

## 2019-04-26 LAB — NOVEL CORONAVIRUS, NAA (HOSP ORDER, SEND-OUT TO REF LAB; TAT 18-24 HRS): SARS-CoV-2, NAA: NOT DETECTED

## 2019-04-26 LAB — ABO/RH: ABO/RH(D): A POS

## 2019-04-28 MED ORDER — BUPIVACAINE LIPOSOME 1.3 % IJ SUSP
20.0000 mL | Freq: Once | INTRAMUSCULAR | Status: AC
  Filled 2019-04-28: qty 20

## 2019-04-29 ENCOUNTER — Other Ambulatory Visit: Payer: Self-pay

## 2019-04-29 ENCOUNTER — Encounter (HOSPITAL_COMMUNITY): Payer: Self-pay | Admitting: Orthopedic Surgery

## 2019-04-29 ENCOUNTER — Inpatient Hospital Stay (HOSPITAL_COMMUNITY): Payer: BC Managed Care – PPO | Admitting: Certified Registered"

## 2019-04-29 ENCOUNTER — Encounter (HOSPITAL_COMMUNITY)
Admission: RE | Disposition: A | Discharge status: Home Health | Payer: Self-pay | Source: Home / Self Care | Attending: Orthopedic Surgery

## 2019-04-29 ENCOUNTER — Inpatient Hospital Stay (HOSPITAL_COMMUNITY)
Admission: RE | Admit: 2019-04-29 | Discharge: 2019-05-01 | DRG: 470 | Disposition: A | Discharge status: Home Health | Payer: BC Managed Care – PPO | Attending: Orthopedic Surgery | Admitting: Orthopedic Surgery

## 2019-04-29 ENCOUNTER — Inpatient Hospital Stay (HOSPITAL_COMMUNITY): Payer: BC Managed Care – PPO | Admitting: Physician Assistant

## 2019-04-29 DIAGNOSIS — Z6838 Body mass index (BMI) 38.0-38.9, adult: Secondary | ICD-10-CM | POA: Diagnosis not present

## 2019-04-29 DIAGNOSIS — E559 Vitamin D deficiency, unspecified: Secondary | ICD-10-CM | POA: Diagnosis not present

## 2019-04-29 DIAGNOSIS — G4733 Obstructive sleep apnea (adult) (pediatric): Secondary | ICD-10-CM | POA: Diagnosis present

## 2019-04-29 DIAGNOSIS — Z91018 Allergy to other foods: Secondary | ICD-10-CM | POA: Diagnosis not present

## 2019-04-29 DIAGNOSIS — Z882 Allergy status to sulfonamides status: Secondary | ICD-10-CM | POA: Diagnosis not present

## 2019-04-29 DIAGNOSIS — Z880 Allergy status to penicillin: Secondary | ICD-10-CM | POA: Diagnosis not present

## 2019-04-29 DIAGNOSIS — I10 Essential (primary) hypertension: Secondary | ICD-10-CM | POA: Diagnosis not present

## 2019-04-29 DIAGNOSIS — M1710 Unilateral primary osteoarthritis, unspecified knee: Secondary | ICD-10-CM

## 2019-04-29 DIAGNOSIS — M179 Osteoarthritis of knee, unspecified: Secondary | ICD-10-CM | POA: Diagnosis present

## 2019-04-29 DIAGNOSIS — G43909 Migraine, unspecified, not intractable, without status migrainosus: Secondary | ICD-10-CM | POA: Diagnosis not present

## 2019-04-29 DIAGNOSIS — M25762 Osteophyte, left knee: Secondary | ICD-10-CM | POA: Diagnosis not present

## 2019-04-29 DIAGNOSIS — Z923 Personal history of irradiation: Secondary | ICD-10-CM | POA: Diagnosis not present

## 2019-04-29 DIAGNOSIS — G8918 Other acute postprocedural pain: Secondary | ICD-10-CM | POA: Diagnosis not present

## 2019-04-29 DIAGNOSIS — Z8349 Family history of other endocrine, nutritional and metabolic diseases: Secondary | ICD-10-CM | POA: Diagnosis not present

## 2019-04-29 DIAGNOSIS — R279 Unspecified lack of coordination: Secondary | ICD-10-CM | POA: Diagnosis not present

## 2019-04-29 DIAGNOSIS — M1712 Unilateral primary osteoarthritis, left knee: Secondary | ICD-10-CM | POA: Diagnosis not present

## 2019-04-29 DIAGNOSIS — Z9221 Personal history of antineoplastic chemotherapy: Secondary | ICD-10-CM | POA: Diagnosis not present

## 2019-04-29 DIAGNOSIS — E7800 Pure hypercholesterolemia, unspecified: Secondary | ICD-10-CM | POA: Diagnosis present

## 2019-04-29 DIAGNOSIS — R0902 Hypoxemia: Secondary | ICD-10-CM | POA: Diagnosis not present

## 2019-04-29 DIAGNOSIS — Z8541 Personal history of malignant neoplasm of cervix uteri: Secondary | ICD-10-CM | POA: Diagnosis not present

## 2019-04-29 DIAGNOSIS — Z87891 Personal history of nicotine dependence: Secondary | ICD-10-CM

## 2019-04-29 DIAGNOSIS — Z743 Need for continuous supervision: Secondary | ICD-10-CM | POA: Diagnosis not present

## 2019-04-29 DIAGNOSIS — Z96652 Presence of left artificial knee joint: Secondary | ICD-10-CM | POA: Diagnosis not present

## 2019-04-29 DIAGNOSIS — F419 Anxiety disorder, unspecified: Secondary | ICD-10-CM | POA: Diagnosis not present

## 2019-04-29 HISTORY — PX: TOTAL KNEE ARTHROPLASTY: SHX125

## 2019-04-29 HISTORY — DX: Unilateral primary osteoarthritis, unspecified knee: M17.10

## 2019-04-29 LAB — TYPE AND SCREEN
ABO/RH(D): A POS
Antibody Screen: NEGATIVE

## 2019-04-29 SURGERY — ARTHROPLASTY, KNEE, TOTAL
Anesthesia: General | Site: Knee | Laterality: Left

## 2019-04-29 MED ORDER — PROPOFOL 10 MG/ML IV BOLUS
INTRAVENOUS | Status: AC
  Filled 2019-04-29: qty 20

## 2019-04-29 MED ORDER — METHOCARBAMOL 500 MG IVPB - SIMPLE MED
500.0000 mg | Freq: Four times a day (QID) | INTRAVENOUS | Status: DC | PRN
  Administered 2019-04-29: 500 mg via INTRAVENOUS
  Filled 2019-04-29: qty 50

## 2019-04-29 MED ORDER — AMLODIPINE BESYLATE 5 MG PO TABS
5.0000 mg | ORAL_TABLET | Freq: Every day | ORAL | Status: DC
  Administered 2019-05-01: 5 mg via ORAL
  Filled 2019-04-29 (×2): qty 1

## 2019-04-29 MED ORDER — METHOCARBAMOL 500 MG PO TABS: 500.0000 mg | ORAL_TABLET | Freq: Four times a day (QID) | ORAL | 0 refills | Status: DC | PRN

## 2019-04-29 MED ORDER — GABAPENTIN 300 MG PO CAPS: ORAL_CAPSULE | ORAL | 0 refills | Status: DC

## 2019-04-29 MED ORDER — DIPHENHYDRAMINE HCL 12.5 MG/5ML PO ELIX: 12.5000 mg | ORAL_SOLUTION | ORAL | Status: DC | PRN

## 2019-04-29 MED ORDER — ONDANSETRON HCL 4 MG/2ML IJ SOLN: 4.0000 mg | Freq: Four times a day (QID) | INTRAMUSCULAR | Status: DC | PRN

## 2019-04-29 MED ORDER — ONDANSETRON HCL 4 MG/2ML IJ SOLN
INTRAMUSCULAR | Status: DC | PRN
  Administered 2019-04-29: 4 mg via INTRAVENOUS

## 2019-04-29 MED ORDER — METHOCARBAMOL 500 MG IVPB - SIMPLE MED
INTRAVENOUS | Status: AC
  Filled 2019-04-29: qty 50

## 2019-04-29 MED ORDER — FENTANYL CITRATE (PF) 100 MCG/2ML IJ SOLN
INTRAMUSCULAR | Status: AC
  Filled 2019-04-29: qty 2

## 2019-04-29 MED ORDER — OXYCODONE HCL 5 MG/5ML PO SOLN: 5.0000 mg | Freq: Once | ORAL | Status: DC | PRN

## 2019-04-29 MED ORDER — ASPIRIN EC 325 MG PO TBEC: 325.0000 mg | DELAYED_RELEASE_TABLET | Freq: Every day | ORAL | 0 refills | Status: AC

## 2019-04-29 MED ORDER — FENTANYL CITRATE (PF) 100 MCG/2ML IJ SOLN
25.0000 ug | INTRAMUSCULAR | Status: DC | PRN
  Administered 2019-04-29 (×3): 50 ug via INTRAVENOUS

## 2019-04-29 MED ORDER — GABAPENTIN 300 MG PO CAPS
300.0000 mg | ORAL_CAPSULE | Freq: Three times a day (TID) | ORAL | Status: DC
  Administered 2019-04-29 – 2019-05-01 (×7): 300 mg via ORAL
  Filled 2019-04-29 (×7): qty 1

## 2019-04-29 MED ORDER — STERILE WATER FOR IRRIGATION IR SOLN
Status: DC | PRN
  Administered 2019-04-29 (×2): 1000 mL

## 2019-04-29 MED ORDER — ONDANSETRON HCL 4 MG/2ML IJ SOLN
INTRAMUSCULAR | Status: AC
  Filled 2019-04-29: qty 2

## 2019-04-29 MED ORDER — ASPIRIN EC 325 MG PO TBEC
325.0000 mg | DELAYED_RELEASE_TABLET | Freq: Two times a day (BID) | ORAL | Status: DC
  Administered 2019-04-30 – 2019-05-01 (×3): 325 mg via ORAL
  Filled 2019-04-29 (×3): qty 1

## 2019-04-29 MED ORDER — BISACODYL 10 MG RE SUPP: 10.0000 mg | Freq: Every day | RECTAL | Status: DC | PRN

## 2019-04-29 MED ORDER — ONDANSETRON HCL 4 MG/2ML IJ SOLN
4.0000 mg | Freq: Four times a day (QID) | INTRAMUSCULAR | Status: AC | PRN
  Administered 2019-04-29: 4 mg via INTRAVENOUS

## 2019-04-29 MED ORDER — MORPHINE SULFATE (PF) 4 MG/ML IV SOLN: 1.0000 mg | INTRAVENOUS | Status: DC | PRN

## 2019-04-29 MED ORDER — PROPOFOL 10 MG/ML IV BOLUS
INTRAVENOUS | Status: DC | PRN
  Administered 2019-04-29: 200 mg via INTRAVENOUS

## 2019-04-29 MED ORDER — SODIUM CHLORIDE 0.9 % IV BOLUS
500.0000 mL | Freq: Once | INTRAVENOUS | Status: AC
  Administered 2019-04-29: 500 mL via INTRAVENOUS

## 2019-04-29 MED ORDER — METHOCARBAMOL 500 MG PO TABS
500.0000 mg | ORAL_TABLET | Freq: Four times a day (QID) | ORAL | Status: DC | PRN
  Administered 2019-04-29 – 2019-05-01 (×6): 500 mg via ORAL
  Filled 2019-04-29 (×6): qty 1

## 2019-04-29 MED ORDER — BUPIVACAINE LIPOSOME 1.3 % IJ SUSP
INTRAMUSCULAR | Status: DC | PRN
  Administered 2019-04-29: 20 mL

## 2019-04-29 MED ORDER — PHENOL 1.4 % MT LIQD: 1.0000 | OROMUCOSAL | Status: DC | PRN

## 2019-04-29 MED ORDER — OXYCODONE HCL 5 MG PO TABS
5.0000 mg | ORAL_TABLET | ORAL | Status: DC | PRN
  Administered 2019-04-29 – 2019-04-30 (×3): 5 mg via ORAL
  Administered 2019-04-30 (×2): 10 mg via ORAL
  Administered 2019-04-30 (×2): 5 mg via ORAL
  Administered 2019-05-01: 10 mg via ORAL
  Administered 2019-05-01: 5 mg via ORAL
  Administered 2019-05-01 (×2): 10 mg via ORAL
  Filled 2019-04-29 (×3): qty 1
  Filled 2019-04-29: qty 2
  Filled 2019-04-29 (×4): qty 1
  Filled 2019-04-29 (×4): qty 2

## 2019-04-29 MED ORDER — OXYCODONE HCL 5 MG PO TABS: 5.0000 mg | ORAL_TABLET | Freq: Once | ORAL | Status: DC | PRN

## 2019-04-29 MED ORDER — MIDAZOLAM HCL 2 MG/2ML IJ SOLN
INTRAMUSCULAR | Status: AC
  Filled 2019-04-29: qty 2

## 2019-04-29 MED ORDER — OXYCODONE HCL 5 MG PO TABS: 5.0000 mg | ORAL_TABLET | Freq: Four times a day (QID) | ORAL | 0 refills | Status: DC | PRN

## 2019-04-29 MED ORDER — PHENYLEPHRINE 40 MCG/ML (10ML) SYRINGE FOR IV PUSH (FOR BLOOD PRESSURE SUPPORT)
PREFILLED_SYRINGE | INTRAVENOUS | Status: DC | PRN
  Administered 2019-04-29: 120 ug via INTRAVENOUS

## 2019-04-29 MED ORDER — MIDAZOLAM HCL 2 MG/2ML IJ SOLN
INTRAMUSCULAR | Status: DC | PRN
  Administered 2019-04-29: 2 mg via INTRAVENOUS

## 2019-04-29 MED ORDER — DEXAMETHASONE SODIUM PHOSPHATE 10 MG/ML IJ SOLN
10.0000 mg | Freq: Once | INTRAMUSCULAR | Status: DC
  Filled 2019-04-29: qty 1

## 2019-04-29 MED ORDER — DEXAMETHASONE SODIUM PHOSPHATE 10 MG/ML IJ SOLN
8.0000 mg | Freq: Once | INTRAMUSCULAR | Status: AC
  Administered 2019-04-29: 8 mg via INTRAVENOUS

## 2019-04-29 MED ORDER — SODIUM CHLORIDE 0.9 % IR SOLN
Status: DC | PRN
  Administered 2019-04-29: 1000 mL

## 2019-04-29 MED ORDER — FENTANYL CITRATE (PF) 100 MCG/2ML IJ SOLN
INTRAMUSCULAR | Status: DC | PRN
  Administered 2019-04-29 (×5): 25 ug via INTRAVENOUS
  Administered 2019-04-29 (×2): 50 ug via INTRAVENOUS
  Administered 2019-04-29 (×2): 25 ug via INTRAVENOUS

## 2019-04-29 MED ORDER — LACTATED RINGERS IV SOLN
INTRAVENOUS | Status: DC
  Administered 2019-04-29: 06:00:00 via INTRAVENOUS

## 2019-04-29 MED ORDER — CHLORPROMAZINE HCL 25 MG PO TABS
25.0000 mg | ORAL_TABLET | Freq: Three times a day (TID) | ORAL | Status: DC | PRN
  Filled 2019-04-29: qty 1

## 2019-04-29 MED ORDER — MENTHOL 3 MG MT LOZG: 1.0000 | LOZENGE | OROMUCOSAL | Status: DC | PRN

## 2019-04-29 MED ORDER — POLYETHYLENE GLYCOL 3350 17 G PO PACK: 17.0000 g | PACK | Freq: Every day | ORAL | Status: DC | PRN

## 2019-04-29 MED ORDER — TRAMADOL HCL 50 MG PO TABS: 50.0000 mg | ORAL_TABLET | Freq: Four times a day (QID) | ORAL | Status: DC | PRN

## 2019-04-29 MED ORDER — TRANEXAMIC ACID-NACL 1000-0.7 MG/100ML-% IV SOLN
1000.0000 mg | INTRAVENOUS | Status: AC
  Administered 2019-04-29: 1000 mg via INTRAVENOUS
  Filled 2019-04-29: qty 100

## 2019-04-29 MED ORDER — PROPOFOL 500 MG/50ML IV EMUL
INTRAVENOUS | Status: AC
  Filled 2019-04-29: qty 50

## 2019-04-29 MED ORDER — SODIUM CHLORIDE (PF) 0.9 % IJ SOLN
INTRAMUSCULAR | Status: DC | PRN
  Administered 2019-04-29: 60 mL

## 2019-04-29 MED ORDER — LIDOCAINE 2% (20 MG/ML) 5 ML SYRINGE
INTRAMUSCULAR | Status: DC | PRN
  Administered 2019-04-29: 60 mg via INTRAVENOUS

## 2019-04-29 MED ORDER — ONDANSETRON HCL 4 MG PO TABS: 4.0000 mg | ORAL_TABLET | Freq: Four times a day (QID) | ORAL | Status: DC | PRN

## 2019-04-29 MED ORDER — DEXAMETHASONE SODIUM PHOSPHATE 10 MG/ML IJ SOLN
INTRAMUSCULAR | Status: AC
  Filled 2019-04-29: qty 1

## 2019-04-29 MED ORDER — CEFAZOLIN SODIUM-DEXTROSE 2-4 GM/100ML-% IV SOLN
2.0000 g | Freq: Four times a day (QID) | INTRAVENOUS | Status: AC
  Administered 2019-04-29 (×2): 2 g via INTRAVENOUS
  Filled 2019-04-29 (×2): qty 100

## 2019-04-29 MED ORDER — FLEET ENEMA 7-19 GM/118ML RE ENEM: 1.0000 | ENEMA | Freq: Once | RECTAL | Status: DC | PRN

## 2019-04-29 MED ORDER — SODIUM CHLORIDE 0.9 % IV SOLN
INTRAVENOUS | Status: DC
  Administered 2019-04-29: 14:00:00 via INTRAVENOUS

## 2019-04-29 MED ORDER — DOCUSATE SODIUM 100 MG PO CAPS
100.0000 mg | ORAL_CAPSULE | Freq: Two times a day (BID) | ORAL | Status: DC
  Filled 2019-04-29 (×3): qty 1

## 2019-04-29 MED ORDER — CHLORHEXIDINE GLUCONATE 4 % EX LIQD: 60.0000 mL | Freq: Once | CUTANEOUS | Status: DC

## 2019-04-29 MED ORDER — METOCLOPRAMIDE HCL 5 MG/ML IJ SOLN: 5.0000 mg | Freq: Three times a day (TID) | INTRAMUSCULAR | Status: DC | PRN

## 2019-04-29 MED ORDER — SODIUM CHLORIDE (PF) 0.9 % IJ SOLN
INTRAMUSCULAR | Status: AC
  Filled 2019-04-29: qty 10

## 2019-04-29 MED ORDER — METOCLOPRAMIDE HCL 5 MG PO TABS: 5.0000 mg | ORAL_TABLET | Freq: Three times a day (TID) | ORAL | Status: DC | PRN

## 2019-04-29 MED ORDER — ATORVASTATIN CALCIUM 10 MG PO TABS
10.0000 mg | ORAL_TABLET | Freq: Every day | ORAL | Status: DC
  Administered 2019-04-29 – 2019-04-30 (×2): 10 mg via ORAL
  Filled 2019-04-29 (×2): qty 1

## 2019-04-29 MED ORDER — SODIUM CHLORIDE 0.9 % IV BOLUS: 250.0000 mL | Freq: Once | INTRAVENOUS | Status: DC

## 2019-04-29 MED ORDER — SODIUM CHLORIDE (PF) 0.9 % IJ SOLN
INTRAMUSCULAR | Status: AC
  Filled 2019-04-29: qty 50

## 2019-04-29 MED ORDER — POVIDONE-IODINE 10 % EX SWAB
2.0000 "application " | Freq: Once | CUTANEOUS | Status: AC
  Administered 2019-04-29: 2 via TOPICAL

## 2019-04-29 MED ORDER — ROPIVACAINE HCL 5 MG/ML IJ SOLN
INTRAMUSCULAR | Status: DC | PRN
  Administered 2019-04-29: 25 mL via EPIDURAL

## 2019-04-29 MED ORDER — PHENYLEPHRINE 40 MCG/ML (10ML) SYRINGE FOR IV PUSH (FOR BLOOD PRESSURE SUPPORT)
PREFILLED_SYRINGE | INTRAVENOUS | Status: AC
  Filled 2019-04-29: qty 10

## 2019-04-29 MED ORDER — TRAMADOL HCL 50 MG PO TABS: 50.0000 mg | ORAL_TABLET | Freq: Four times a day (QID) | ORAL | 0 refills | Status: DC | PRN

## 2019-04-29 MED ORDER — CEFAZOLIN SODIUM-DEXTROSE 2-4 GM/100ML-% IV SOLN
2.0000 g | INTRAVENOUS | Status: AC
  Administered 2019-04-29: 2 g via INTRAVENOUS
  Filled 2019-04-29: qty 100

## 2019-04-29 MED ORDER — LISDEXAMFETAMINE DIMESYLATE 30 MG PO CAPS: 60.0000 mg | ORAL_CAPSULE | Freq: Every day | ORAL | Status: DC

## 2019-04-29 SURGICAL SUPPLY — 58 items
ATTUNE MED DOME PAT 38 KNEE (Knees) ×2 IMPLANT
ATTUNE PS FEM LT SZ 5 CEM KNEE (Femur) ×2 IMPLANT
ATTUNE PSRP INSR SZ 5 10M KNEE (Insert) ×2 IMPLANT
BAG ZIPLOCK 12X15 (MISCELLANEOUS) ×2 IMPLANT
BASEPLATE TIBIAL ROTATING SZ 4 (Knees) ×2 IMPLANT
BLADE SAG 18X100X1.27 (BLADE) ×2 IMPLANT
BLADE SAW SGTL 11.0X1.19X90.0M (BLADE) ×2 IMPLANT
BLADE SURG SZ10 CARB STEEL (BLADE) ×4 IMPLANT
BNDG ELASTIC 6X5.8 VLCR STR LF (GAUZE/BANDAGES/DRESSINGS) ×2 IMPLANT
BOWL SMART MIX CTS (DISPOSABLE) ×2 IMPLANT
CEMENT HV SMART SET (Cement) ×4 IMPLANT
COVER SURGICAL LIGHT HANDLE (MISCELLANEOUS) ×2 IMPLANT
COVER WAND RF STERILE (DRAPES) IMPLANT
CUFF TOURN SGL QUICK 34 (TOURNIQUET CUFF) ×1
CUFF TRNQT CYL 34X4.125X (TOURNIQUET CUFF) ×1 IMPLANT
DECANTER SPIKE VIAL GLASS SM (MISCELLANEOUS) ×2 IMPLANT
DRAPE U-SHAPE 47X51 STRL (DRAPES) ×2 IMPLANT
DRSG ADAPTIC 3X8 NADH LF (GAUZE/BANDAGES/DRESSINGS) ×2 IMPLANT
DRSG AQUACEL AG ADV 3.5X10 (GAUZE/BANDAGES/DRESSINGS) ×2 IMPLANT
DRSG PAD ABDOMINAL 8X10 ST (GAUZE/BANDAGES/DRESSINGS) ×2 IMPLANT
DURAPREP 26ML APPLICATOR (WOUND CARE) ×2 IMPLANT
ELECT REM PT RETURN 15FT ADLT (MISCELLANEOUS) ×2 IMPLANT
EVACUATOR 1/8 PVC DRAIN (DRAIN) ×2 IMPLANT
GAUZE SPONGE 4X4 12PLY STRL (GAUZE/BANDAGES/DRESSINGS) ×2 IMPLANT
GLOVE BIO SURGEON STRL SZ7 (GLOVE) ×2 IMPLANT
GLOVE BIO SURGEON STRL SZ8 (GLOVE) ×2 IMPLANT
GLOVE BIOGEL PI IND STRL 6.5 (GLOVE) ×1 IMPLANT
GLOVE BIOGEL PI IND STRL 7.0 (GLOVE) ×1 IMPLANT
GLOVE BIOGEL PI IND STRL 8 (GLOVE) ×1 IMPLANT
GLOVE BIOGEL PI INDICATOR 6.5 (GLOVE) ×1
GLOVE BIOGEL PI INDICATOR 7.0 (GLOVE) ×1
GLOVE BIOGEL PI INDICATOR 8 (GLOVE) ×1
GLOVE SURG SS PI 6.5 STRL IVOR (GLOVE) ×2 IMPLANT
GOWN STRL REUS W/TWL LRG LVL3 (GOWN DISPOSABLE) ×6 IMPLANT
HANDPIECE INTERPULSE COAX TIP (DISPOSABLE) ×1
HOLDER FOLEY CATH W/STRAP (MISCELLANEOUS) IMPLANT
IMMOBILIZER KNEE 20 (SOFTGOODS) ×2
IMMOBILIZER KNEE 20 THIGH 36 (SOFTGOODS) ×1 IMPLANT
KIT TURNOVER KIT A (KITS) IMPLANT
MANIFOLD NEPTUNE II (INSTRUMENTS) ×2 IMPLANT
NS IRRIG 1000ML POUR BTL (IV SOLUTION) ×2 IMPLANT
PACK TOTAL KNEE CUSTOM (KITS) ×2 IMPLANT
PADDING CAST COTTON 6X4 STRL (CAST SUPPLIES) ×2 IMPLANT
PENCIL SMOKE EVACUATOR (MISCELLANEOUS) IMPLANT
PIN DRILL FIX HALF THREAD (BIT) ×2 IMPLANT
PIN STEINMAN FIXATION KNEE (PIN) ×2 IMPLANT
PROTECTOR NERVE ULNAR (MISCELLANEOUS) ×2 IMPLANT
SET HNDPC FAN SPRY TIP SCT (DISPOSABLE) ×1 IMPLANT
STRIP CLOSURE SKIN 1/2X4 (GAUZE/BANDAGES/DRESSINGS) ×4 IMPLANT
SUT MNCRL AB 4-0 PS2 18 (SUTURE) ×2 IMPLANT
SUT STRATAFIX 0 PDS 27 VIOLET (SUTURE) ×2
SUT VIC AB 2-0 CT1 27 (SUTURE) ×3
SUT VIC AB 2-0 CT1 TAPERPNT 27 (SUTURE) ×3 IMPLANT
SUTURE STRATFX 0 PDS 27 VIOLET (SUTURE) ×1 IMPLANT
TRAY FOLEY MTR SLVR 16FR STAT (SET/KITS/TRAYS/PACK) IMPLANT
WATER STERILE IRR 1000ML POUR (IV SOLUTION) ×4 IMPLANT
WRAP KNEE MAXI GEL POST OP (GAUZE/BANDAGES/DRESSINGS) ×2 IMPLANT
YANKAUER SUCT BULB TIP 10FT TU (MISCELLANEOUS) ×2 IMPLANT

## 2019-04-29 NOTE — Anesthesia Preprocedure Evaluation (Addendum)
Anesthesia Evaluation  Patient identified by MRN, date of birth, ID band Patient awake    Reviewed: Allergy & Precautions, H&P , NPO status , Patient's Chart, lab work & pertinent test results  Airway Mallampati: II   Neck ROM: full    Dental   Pulmonary sleep apnea , former smoker,    breath sounds clear to auscultation       Cardiovascular hypertension,  Rhythm:regular Rate:Normal     Neuro/Psych  Headaches, Anxiety    GI/Hepatic   Endo/Other    Renal/GU      Musculoskeletal   Abdominal   Peds  Hematology   Anesthesia Other Findings   Reproductive/Obstetrics                             Anesthesia Physical Anesthesia Plan  ASA: II  Anesthesia Plan: General   Post-op Pain Management:  Regional for Post-op pain   Induction: Intravenous  PONV Risk Score and Plan: 3 and Ondansetron, Dexamethasone, Midazolam, Treatment may vary due to age or medical condition and Scopolamine patch - Pre-op  Airway Management Planned: Oral ETT  Additional Equipment:   Intra-op Plan:   Post-operative Plan: Extubation in OR  Informed Consent: I have reviewed the patients History and Physical, chart, labs and discussed the procedure including the risks, benefits and alternatives for the proposed anesthesia with the patient or authorized representative who has indicated his/her understanding and acceptance.       Plan Discussed with: CRNA, Anesthesiologist and Surgeon  Anesthesia Plan Comments:        Anesthesia Quick Evaluation

## 2019-04-29 NOTE — Anesthesia Procedure Notes (Signed)
Procedure Name: LMA Insertion Date/Time: 04/29/2019 7:28 AM Performed by: Niel Hummer, CRNA Pre-anesthesia Checklist: Patient identified, Emergency Drugs available, Suction available and Patient being monitored Patient Re-evaluated:Patient Re-evaluated prior to induction Oxygen Delivery Method: Circle system utilized Preoxygenation: Pre-oxygenation with 100% oxygen Induction Type: IV induction LMA: LMA inserted LMA Size: 4.0 Dental Injury: Teeth and Oropharynx as per pre-operative assessment

## 2019-04-29 NOTE — Op Note (Signed)
OPERATIVE REPORT-TOTAL KNEE ARTHROPLASTY   Pre-operative diagnosis- Osteoarthritis  Left knee(s)  Post-operative diagnosis- Osteoarthritis Left knee(s)  Procedure-  Left  Total Knee Arthroplasty (Depuy Attune)  Surgeon- Dione Plover. Shammara Jarrett, MD  Assistant- Ardeen Jourdain, PA-C   Anesthesia-  GA combined with regional for post-op pain  EBL-50 mL   Drains Hemovac  Tourniquet time-  Total Tourniquet Time Documented: Thigh (Left) - 42 minutes Total: Thigh (Left) - 42 minutes     Complications- None  Condition-PACU - hemodynamically stable.   Brief Clinical Note  [name] is a 48 y.o. year old female with end stage OA of her left knee with progressively worsening pain and dysfunction. She has constant pain, with activity and at rest and significant functional deficits with difficulties even with ADLs. She has had extensive non-op management including analgesics, injections of cortisone and viscosupplements, and home exercise program, but remains in significant pain with significant dysfunction. Radiographs show bone on bone arthritis medial and patellofemoral. She presents now for left Total Knee Arthroplasty.    Procedure in detail---   The patient is brought into the operating room and positioned supine on the operating table. After successful administration of  GA combined with regional for post-op pain,   a tourniquet is placed high on the  Left thigh(s) and the lower extremity is prepped and draped in the usual sterile fashion. Time out is performed by the operating team and then the  Left lower extremity is wrapped in Esmarch, knee flexed and the tourniquet inflated to 300 mmHg.       A midline incision is made with a ten blade through the subcutaneous tissue to the level of the extensor mechanism. A fresh blade is used to make a medial parapatellar arthrotomy. Soft tissue over the proximal medial tibia is subperiosteally elevated to the joint line with a knife and into  the semimembranosus bursa with a Cobb elevator. Soft tissue over the proximal lateral tibia is elevated with attention being paid to avoiding the patellar tendon on the tibial tubercle. The patella is everted, knee flexed 90 degrees and the ACL and PCL are removed. Findings are bone on bone medial and patellofemoral with large marginal osteophytes.        The drill is used to create a starting hole in the distal femur and the canal is thoroughly irrigated with sterile saline to remove the fatty contents. The 5 degree Left  valgus alignment guide is placed into the femoral canal and the distal femoral cutting block is pinned to remove 9 mm off the distal femur. Resection is made with an oscillating saw.      The tibia is subluxed forward and the menisci are removed. The extramedullary alignment guide is placed referencing proximally at the medial aspect of the tibial tubercle and distally along the second metatarsal axis and tibial crest. The block is pinned to remove 56mm off the more deficient medial  side. Resection is made with an oscillating saw. Size 4is the most appropriate size for the tibia and the proximal tibia is prepared with the modular drill and keel punch for that size.      The femoral sizing guide is placed and size 5 is most appropriate. Rotation is marked off the epicondylar axis and confirmed by creating a rectangular flexion gap at 90 degrees. The size 5 cutting block is pinned in this rotation and the anterior, posterior and chamfer cuts are made with the oscillating saw. The intercondylar block is then placed  and that cut is made.      Trial size 4 tibial component, trial size 5 posterior stabilized femur and a 10  mm posterior stabilized rotating platform insert trial is placed. Full extension is achieved with excellent varus/valgus and anterior/posterior balance throughout full range of motion. The patella is everted and thickness measured to be 22  mm. Free hand resection is taken to 12  mm, a 38 template is placed, lug holes are drilled, trial patella is placed, and it tracks normally. Osteophytes are removed off the posterior femur with the trial in place. All trials are removed and the cut bone surfaces prepared with pulsatile lavage. Cement is mixed and once ready for implantation, the size 4 tibial implant, size  5 posterior stabilized femoral component, and the size 38 patella are cemented in place and the patella is held with the clamp. The trial insert is placed and the knee held in full extension. The Exparel (20 ml mixed with 60 ml saline) is injected into the extensor mechanism, posterior capsule, medial and lateral gutters and subcutaneous tissues.  All extruded cement is removed and once the cement is hard the permanent 10 mm posterior stabilized rotating platform insert is placed into the tibial tray.      The wound is copiously irrigated with saline solution and the extensor mechanism closed over a hemovac drain with #1 V-loc suture. The tourniquet is released for a total tourniquet time of 42  minutes. Flexion against gravity is 140 degrees and the patella tracks normally. Subcutaneous tissue is closed with 2.0 vicryl and subcuticular with running 4.0 Monocryl. The incision is cleaned and dried and steri-strips and a bulky sterile dressing are applied. The limb is placed into a knee immobilizer and the patient is awakened and transported to recovery in stable condition.      Please note that a surgical assistant was a medical necessity for this procedure in order to perform it in a safe and expeditious manner. Surgical assistant was necessary to retract the ligaments and vital neurovascular structures to prevent injury to them and also necessary for proper positioning of the limb to allow for anatomic placement of the prosthesis.   Dione Plover Jadalyn Oliveri, MD    04/29/2019, 8:30 AM

## 2019-04-29 NOTE — Progress Notes (Signed)
Pt refused CPAP qhs.  Pt states that she is planning on sleeping with the head of bed elevated tonight and does not think that she will need it since she is going home tomorrow.  Pt encouraged to have her RN contact RT should she change her mind.

## 2019-04-29 NOTE — Evaluation (Signed)
Physical Therapy Evaluation Patient Details Name: [name] MRN: [id number] DOB: Oct 10, 1970 Today's Date: 04/29/2019   History of Present Illness  Patient is 48 y.o. female s/p Lt TKA on 04/29/19 with PMH significant for cervical cancer, HTN, anxiety.  Clinical Impression  [name] is a 48 y.o. female POD 0 s/p Lt TKA. Patient reports modified independence with use of SPC for mobility at baseline. Patient is now limited by functional impairments (see PT problem list below) and requires min-mod assist for transfers and gait with RW. Patient was able to ambulate ~5 feet with RW and was limited by pain and nausea. Patient will benefit from continued skilled PT interventions to address impairments and progress towards PLOF. Acute PT will follow to progress mobility and stair training in preparation for safe discharge home.     Follow Up Recommendations Follow surgeon's recommendation for DC plan and follow-up therapies    Equipment Recommendations  3in1 (PT);Rolling walker with 5" wheels    Recommendations for Other Services       Precautions / Restrictions Precautions Precautions: Fall Restrictions Weight Bearing Restrictions: No      Mobility  Bed Mobility Overal bed mobility: Needs Assistance             General bed mobility comments: pt OOB in PACU at start of eval, in recliner  Transfers Overall transfer level: Needs assistance Equipment used: Rolling walker (2 wheeled) Transfers: Sit to/from [facility] Sit to Stand: Mod assist Stand pivot transfers: Min assist       General transfer comment: cues for hand placement and technique to initiate power up, assist required for power up and to complete rise to stand. cues for step pattern in RW to perform stand pivot to wheelchair for transportation upstairs.  Ambulation/Gait Ambulation/Gait assistance: Min assist Gait Distance (Feet): 5 Feet Assistive device: Rolling walker (2  wheeled) Gait Pattern/deviations: Step-to pattern;Decreased stride length;Decreased weight shift to left;Decreased stance time - left;Antalgic Gait velocity: slow   General Gait Details: verbal cues required for safe step pattern and to maintain safe proximity to RW, assist to steady and chair follow for safety  Stairs            Wheelchair Mobility    Modified Rankin (Stroke Patients Only)       Balance Overall balance assessment: Needs assistance Sitting-balance support: Feet supported;No upper extremity supported Sitting balance-Leahy Scale: Good     Standing balance support: During functional activity;Bilateral upper extremity supported Standing balance-Leahy Scale: Poor                  Pertinent Vitals/Pain Pain Assessment: Faces Faces Pain Scale: Hurts even more Pain Location: Lt knee Pain Descriptors / Indicators: Aching;Sore;Guarding;Grimacing Pain Intervention(s): Limited activity within patient's tolerance;Monitored during session;Repositioned    Home Living Family/patient expects to be discharged to:: Private residence Living Arrangements: Children;Parent;Other relatives Available Help at Discharge: Family;Available 24 hours/day Type of Home: House Home Access: Stairs to enter   [employer] of Steps: 4-5 Home Layout: Two level;Able to live on main level with bedroom/bathroom Home Equipment: Shower seat - built in;Grab bars - tub/shower Additional Comments: her mother is there to help her recover    Prior Function Level of Independence: Independent with assistive device(s)         Comments: pt using SPC prior to surgery due to knee pain     Hand Dominance   Dominant Hand: Right    Extremity/Trunk Assessment   Upper Extremity Assessment Upper  Extremity Assessment: Overall WFL for tasks assessed    Lower Extremity Assessment Lower Extremity Assessment: LLE deficits/detail LLE Deficits / Details: no buckling noted in  standing. LLE: Unable to fully assess due to pain LLE Sensation: WNL LLE Coordination: WNL    Cervical / Trunk Assessment Cervical / Trunk Assessment: Normal  Communication   Communication: No difficulties  Cognition Arousal/Alertness: Awake/alert Behavior During Therapy: Anxious Overall Cognitive Status: Within Functional Limits for tasks assessed        General Comments: pt expressed concern about the idea of going home tonight appeared anxious      General Comments      Exercises     Assessment/Plan    PT Assessment Patient needs continued PT services  PT Problem List Decreased strength;Decreased balance;Decreased mobility;Decreased range of motion;Decreased knowledge of use of DME;Decreased activity tolerance       PT Treatment Interventions DME instruction;Functional mobility training;Balance training;Patient/family education;Therapeutic activities;Gait training;Stair training;Therapeutic exercise    PT Goals (Current goals can be found in the Care Plan section)  Acute Rehab PT Goals Patient Stated Goal: to be able to move more independent PT Goal Formulation: With patient Time For Goal Achievement: 05/06/19 Potential to Achieve Goals: Good    Frequency 7X/week    AM-PAC PT "6 Clicks" Mobility  Outcome Measure Help needed turning from your back to your side while in a flat bed without using bedrails?: A Little Help needed moving from lying on your back to sitting on the side of a flat bed without using bedrails?: A Little Help needed moving to and from a bed to a chair (including a wheelchair)?: A Little Help needed standing up from a chair using your arms (e.g., wheelchair or bedside chair)?: A Little Help needed to walk in hospital room?: A Little Help needed climbing 3-5 steps with a railing? : A Little 6 Click Score: 18    End of Session Equipment Utilized During Treatment: Gait belt Activity Tolerance: Patient limited by pain Patient left: in  chair;with nursing/sitter in room Nurse Communication: Mobility status PT Visit Diagnosis: Muscle weakness (generalized) (M62.81);Difficulty in walking, not elsewhere classified (R26.2)    Time: [id number] PT Time Calculation (min) (ACUTE ONLY): 19 min   Charges:   PT Evaluation $PT Eval Low Complexity: 1 Low         Kipp Brood, PT, DPT Physical Therapist with [facility]  04/29/2019 6:11 PM

## 2019-04-29 NOTE — Transfer of Care (Signed)
Immediate Anesthesia Transfer of Care Note  Patient: [name] [facility]  Procedure(s) Performed: TOTAL KNEE ARTHROPLASTY (Left Knee)  Patient Location: PACU  Anesthesia Type:General  Level of Consciousness: awake, alert  and oriented  Airway & Oxygen Therapy: Patient Spontanous Breathing and Patient connected to face mask oxygen  Post-op Assessment: Report given to RN, Post -op Vital signs reviewed and stable and Patient moving all extremities X 4  Post vital signs: Reviewed and stable  Last Vitals:  Vitals Value Taken Time  BP 119/77 04/29/19 0851  Temp    Pulse 68 04/29/19 0853  Resp 15 04/29/19 0853  SpO2 98 % 04/29/19 0853  Vitals shown include unvalidated device data.  Last Pain:  Vitals:   04/29/19 0549  TempSrc: Oral      Patients Stated Pain Goal: 4 ([id number])  Complications: No apparent anesthesia complications

## 2019-04-29 NOTE — Interval H&P Note (Signed)
History and Physical Interval Note:  04/29/2019 6:48 AM  [name]  has presented today for surgery, with the diagnosis of left knee osteoarthritis.  The various methods of treatment have been discussed with the patient and family. After consideration of risks, benefits and other options for treatment, the patient has consented to  Procedure(s) with comments: TOTAL KNEE ARTHROPLASTY (Left) - 66min as a surgical intervention.  The patient's history has been reviewed, patient examined, no change in status, stable for surgery.  I have reviewed the patient's chart and labs.  Questions were answered to the patient's satisfaction.     Pilar Plate Marja Adderley

## 2019-04-29 NOTE — Discharge Instructions (Addendum)
Dr. Gaynelle Arabian [facility] [address] [place], [place] [address] [phone]  TOTAL KNEE REPLACEMENT POSTOPERATIVE DIRECTIONS  Knee Rehabilitation, Guidelines Following Surgery  Results after knee surgery are often greatly improved when you follow the exercise, range of motion and muscle strengthening exercises prescribed by your doctor. Safety measures are also important to protect the knee from further injury. Any time any of these exercises cause you to have increased pain or swelling in your knee joint, decrease the amount until you are comfortable again and slowly increase them. If you have problems or questions, call your caregiver or physical therapist for advice.   HOME CARE INSTRUCTIONS  . Remove items at home which could result in a fall. This includes throw rugs or furniture in walking pathways.   ICE to the affected knee every three hours for 30 minutes at a time and then as needed for pain and swelling.  Continue to use ice on the knee for pain and swelling from surgery. You may notice swelling that will progress down to the foot and ankle.  This is normal after surgery.  Elevate the leg when you are not up walking on it.    Continue to use the breathing machine which will help keep your temperature down.  It is common for your temperature to cycle up and down following surgery, especially at night when you are not up moving around and exerting yourself.  The breathing machine keeps your lungs expanded and your temperature down.  Do not place pillow under knee, focus on keeping the knee straight while resting  DIET You may resume your previous home diet once your are discharged from the hospital.  DRESSING / WOUND CARE / SHOWERING Remove ACE wrap and gauze on Wednesday (2 days following surgery). Leave the adhesive bandage underneath in place until Monday (1 week following surgery).  You may shower 3 days following surgery. Do  not submerge the incision under water.  ACTIVITY Discontinue the knee immobilizer when you can perform a straight leg raise.  Walk with your walker as instructed. Use walker as long as suggested by your caregivers. Avoid periods of inactivity such as sitting longer than an hour when not asleep. This helps prevent blood clots.  You may resume a sexual relationship in one month or when given the OK by your doctor.  You may return to work once you are cleared by your doctor.  Do not drive a car for 6 weeks or until released by you surgeon.  Do not drive while taking narcotics.  WEIGHT BEARING Weight bearing as tolerated with assist device (walker, cane, etc) as directed, use it as long as suggested by your surgeon or therapist, typically at least 4-6 weeks.  POSTOPERATIVE CONSTIPATION PROTOCOL Constipation - defined medically as fewer than three stools per week and severe constipation as less than one stool per week.  One of the most common issues patients have following surgery is constipation.  Even if you have a regular bowel pattern at home, your normal regimen is likely to be disrupted due to multiple reasons following surgery.  Combination of anesthesia, postoperative narcotics, change in appetite and fluid intake all can affect your bowels.  In order to avoid complications following surgery, here are some recommendations in order to help you during your recovery period.  Colace (docusate) - Pick up an over-the-counter form of Colace or another stool softener and take twice a day as long as you are requiring  postoperative pain medications.  Take with a full glass of water daily.  If you experience loose stools or diarrhea, hold the colace until you stool forms back up.  If your symptoms do not get better within 1 week or if they get worse, check with your doctor.  Dulcolax (bisacodyl) - Pick up over-the-counter and take as directed by the product packaging as needed to assist with the  movement of your bowels.  Take with a full glass of water.  Use this product as needed if not relieved by Colace only.   MiraLax (polyethylene glycol) - Pick up over-the-counter to have on hand.  MiraLax is a solution that will increase the amount of water in your bowels to assist with bowel movements.  Take as directed and can mix with a glass of water, juice, soda, coffee, or tea.  Take if you go more than two days without a movement. Do not use MiraLax more than once per day. Call your doctor if you are still constipated or irregular after using this medication for 7 days in a row.  If you continue to have problems with postoperative constipation, please contact the office for further assistance and recommendations.  If you experience "the worst abdominal pain ever" or develop nausea or vomiting, please contact the office immediatly for further recommendations for treatment.  ITCHING If you experience itching with your medications, try taking only a single pain pill, or even half a pain pill at a time.  You can also use Benadryl over the counter for itching or also to help with sleep.   TED HOSE STOCKINGS Wear the elastic stockings on both legs for three weeks following surgery during the day but you may remove then at night for sleeping.  MEDICATIONS See your medication summary on the "After Visit Summary" that the nursing staff will review with you prior to discharge.  You may have some home medications which will be placed on hold until you complete the course of blood thinner medication.  It is important for you to complete the blood thinner medication as prescribed by your surgeon.  Continue your approved medications as instructed at time of discharge.  PRECAUTIONS If you experience chest pain or shortness of breath - call 911 immediately for transfer to the hospital emergency department.  If you develop a fever greater that 101 F, purulent drainage from wound, increased redness or drainage  from wound, foul odor from the wound/dressing, or calf pain - CONTACT YOUR SURGEON.                                                   FOLLOW-UP APPOINTMENTS Make sure you keep all of your appointments after your operation with your surgeon and caregivers. You should call the office at the above phone number and make an appointment for approximately two weeks after the date of your surgery or on the date instructed by your surgeon outlined in the "After Visit Summary".  RANGE OF MOTION AND STRENGTHENING EXERCISES  Rehabilitation of the knee is important following a knee injury or an operation. After just a few days of immobilization, the muscles of the thigh which control the knee become weakened and shrink (atrophy). Knee exercises are designed to build up the tone and strength of the thigh muscles and to improve knee motion. Often times heat used for  twenty to thirty minutes before working out will loosen up your tissues and help with improving the range of motion but do not use heat for the first two weeks following surgery. These exercises can be done on a training (exercise) mat, on the floor, on a table or on a bed. Use what ever works the best and is most comfortable for you Knee exercises include:  . Leg Lifts - While your knee is still immobilized in a splint or cast, you can do straight leg raises. Lift the leg to 60 degrees, hold for 3 sec, and slowly lower the leg. Repeat 10-20 times 2-3 times daily. Perform this exercise against resistance later as your knee gets better.  Javier Docker and Hamstring Sets - Tighten up the muscle on the front of the thigh (Quad) and hold for 5-10 sec. Repeat this 10-20 times hourly. Hamstring sets are done by pushing the foot backward against an object and holding for 5-10 sec. Repeat as with quad sets.   Leg Slides: Lying on your back, slowly slide your foot toward your buttocks, bending your knee up off the floor (only go as far as is comfortable). Then slowly slide  your foot back down until your leg is flat on the floor again.  Angel Wings: Lying on your back spread your legs to the side as far apart as you can without causing discomfort.  A rehabilitation program following serious knee injuries can speed recovery and prevent re-injury in the future due to weakened muscles. Contact your doctor or a physical therapist for more information on knee rehabilitation.   IF YOU ARE TRANSFERRED TO A SKILLED REHAB FACILITY If the patient is transferred to a skilled rehab facility following release from the hospital, a list of the current medications will be sent to the facility for the patient to continue.  When discharged from the skilled rehab facility, please have the facility set up the patient's [place] prior to being released. Also, the skilled facility will be responsible for providing the patient with their medications at time of release from the facility to include their pain medication, the muscle relaxants, and their blood thinner medication. If the patient is still at the rehab facility at time of the two week follow up appointment, the skilled rehab facility will also need to assist the patient in arranging follow up appointment in our office and any transportation needs.  MAKE SURE YOU:  . Understand these instructions.  . Get help right away if you are not doing well or get worse.    Pick up stool softner and laxative for home use following surgery while on pain medications. Do not submerge incision under water. Please use good hand washing techniques while changing dressing each day. May shower starting three days after surgery. Please use a clean towel to pat the incision dry following showers. Continue to use ice for pain and swelling after surgery. Do not use any lotions or creams on the incision until instructed by your surgeon.

## 2019-04-29 NOTE — Anesthesia Procedure Notes (Signed)
Anesthesia Regional Block: Adductor canal block   Pre-Anesthetic Checklist: ,, timeout performed, Correct Patient, Correct Site, Correct Laterality, Correct Procedure, Correct Position, site marked, Risks and benefits discussed,  Surgical consent,  Pre-op evaluation,  At surgeon's request and post-op pain management  Laterality: Left  Prep: chloraprep       Needles:  Injection technique: Single-shot  Needle Type: Echogenic Needle     Needle Length: 9cm  Needle Gauge: 21     Additional Needles:   Narrative:  Start time: 04/29/2019 7:03 AM End time: 04/29/2019 7:09 AM Injection made incrementally with aspirations every 5 mL.  Performed by: Personally  Anesthesiologist: Albertha Ghee, MD  Additional Notes: Pt tolerated the procedure well.

## 2019-04-30 ENCOUNTER — Encounter: Payer: Self-pay | Admitting: *Deleted

## 2019-04-30 LAB — CBC
HCT: 37.3 % (ref 36.0–46.0)
Hemoglobin: 12.1 g/dL (ref 12.0–15.0)
MCH: 29.7 pg (ref 26.0–34.0)
MCHC: 32.4 g/dL (ref 30.0–36.0)
MCV: 91.6 fL (ref 80.0–100.0)
Platelets: 294 10*3/uL (ref 150–400)
RBC: 4.07 MIL/uL (ref 3.87–5.11)
RDW: 12.4 % (ref 11.5–15.5)
WBC: 16.1 10*3/uL — ABNORMAL HIGH (ref 4.0–10.5)
nRBC: 0 % (ref 0.0–0.2)

## 2019-04-30 LAB — BASIC METABOLIC PANEL
Anion gap: 11 (ref 5–15)
BUN: 13 mg/dL (ref 6–20)
CO2: 24 mmol/L (ref 22–32)
Calcium: 8.8 mg/dL — ABNORMAL LOW (ref 8.9–10.3)
Chloride: 101 mmol/L (ref 98–111)
Creatinine, Ser: 0.62 mg/dL (ref 0.44–1.00)
GFR calc Af Amer: >60 mL/min (ref >60)
GFR calc non Af Amer: >60 mL/min (ref >60)
Glucose, Bld: 127 mg/dL — ABNORMAL HIGH (ref 70–99)
Potassium: 3.8 mmol/L (ref 3.5–5.1)
Sodium: 136 mmol/L (ref 135–145)

## 2019-04-30 NOTE — Progress Notes (Signed)
Subjective: 1 Day Post-Op Procedure(s) (LRB): TOTAL KNEE ARTHROPLASTY (Left) Patient reports pain as mild.   Patient seen in rounds by Dr. Wynelle Link. Patient is well, and has had no acute complaints or problems other than pain in the left knee. Could not safely discharge yesterday afternoon due to slower progression with therapy. No issues overnight. Denies chest pain, SOB, or calf pain. Voiding without difficulty. We will continue therapy today.   Objective: Vital signs in last 24 hours: Temp:  [97.5 F (36.4 C)-97.9 F (36.6 C)] 97.9 F (36.6 C) (12/15 0543) Pulse Rate:  [62-75] 73 (12/15 0543) Resp:  [14-20] 18 (12/15 0543) BP: (101-123)/(64-95) 107/95 (12/15 0543) SpO2:  [93 %-100 %] 95 % (12/15 0543)  Intake/Output from previous day:  Intake/Output Summary (Last 24 hours) at 04/30/2019 0745 Last data filed at 04/30/2019 0400 Gross per 24 hour  Intake 1338.3 ml  Output 1520 ml  Net -181.7 ml    Labs: Recent Labs    04/30/19 0251  HGB 12.1   Recent Labs    04/30/19 0251  WBC 16.1*  RBC 4.07  HCT 37.3  PLT 294   Recent Labs    04/30/19 0251  NA 136  K 3.8  CL 101  CO2 24  BUN 13  CREATININE 0.62  GLUCOSE 127*  CALCIUM 8.8*   Exam: General - Patient is Alert and Oriented Extremity - Neurologically intact Neurovascular intact Sensation intact distally Dorsiflexion/Plantar flexion intact Dressing - dressing C/D/I Motor Function - intact, moving foot and toes well on exam.   Past Medical History:  Diagnosis Date  . 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 cervical ca  . Anal fistula   . Anxiety   . Cervical cancer [facility]) current oncologist -- dr Durwin Reges ([facility])/  previously dr Rayford Halsted [facility]   dx 12/ 2014 invasive cervical cancer--- Stage IB2--- 06-11-2013  s/p  TAH w/ BSO and pelvic node dissection,  completed chemoradiation 04/ 2015  . Endometriosis   . Fibroid   . Hemorrhoids   . History  of cancer chemotherapy    FOR CERVICAL CANCER , COMPLETED 08-2013  . History of radiation therapy    FOR CERVICAL CANCER , COMPLETED 08-2013  . Hypercholesteremia   . Hypertension   . IBS (irritable bowel syndrome)   . Migraines   . Nodule of upper lobe of left lung    last CT chest 05-15-2017 (care everywhere in epic)   . OSA on CPAP   . 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 Urinary incontinence     Assessment/Plan: 1 Day Post-Op Procedure(s) (LRB): TOTAL KNEE ARTHROPLASTY (Left) Principal Problem:   OA (osteoarthritis) of knee Active Problems:   Arthritis of knee  Estimated body mass index is 38.12 kg/m as calculated from the following:   Height as of this encounter: 5\' 5"  (1.651 m).   Weight as of this encounter: 103.9 kg. Advance diet Up with therapy D/C IV fluids  Anticipated LOS equal to or greater than 2 midnights due to - Age 49 and older with one or more of the following:  - Obesity  - Expected need for hospital services (PT, OT, Nursing) required for safe  discharge  - Anticipated need for postoperative skilled nursing care or inpatient rehab  - Active co-morbidities: None OR   - Unanticipated findings during/Post Surgery: None  - Patient is a high risk of re-admission  due to: None    DVT Prophylaxis - Aspirin Weight bearing as tolerated. D/C O2 and pulse ox and try on room air. Hemovac pulled without difficulty, will continue therapy today.  Plan is to go Home after hospital stay. Plan for discharge after two sessions of therapy today if meeting goals. Scheduled for outpatient PT at [facility]. Follow-up in the office in 2 weeks.   Theresa Duty, PA-C Orthopedic Surgery 04/30/2019, 7:45 AM

## 2019-04-30 NOTE — Progress Notes (Signed)
PT Cancellation Note  Patient Details Name: [name] MRN: [id number] DOB: 12-14-1970   Cancelled Treatment:     PM session.  Checked on pt twice.  1. Just got back to bed from using BSC with NT.   2.  Soundly sleeping requesting "I just need to get some rest".   Will see in am.    Rica Koyanagi  PTA Acute  Rehabilitation Services Pager      [phone] Office      [phone]

## 2019-04-30 NOTE — Progress Notes (Signed)
Physical Therapy Treatment Patient Details Name: [name] MRN: [id number] DOB: 1970-11-05 Today's Date: 04/30/2019    History of Present Illness Patient is 48 y.o. female s/p Lt TKA on 04/29/19 with PMH significant for cervical cancer, HTN, anxiety.    PT Comments    POD # 1 am session Pt reports a "bad night" due to pain and lack of sleep.  Assisted OOB to amb was difficult. General bed mobility comments: demonstarated and instructed how to use a belt loop to self assist LE.   General transfer comment: increased time with <25% VC's on proper hand placement and safety with turn completion prior to sit.  General Gait Details: 25% VC's on proper upright posture and safety with turns.  Distance limited by fatigue and thigh pain.  Very unsteady gait.  Then returned to room to perform some TE's following HEP handout.  Instructed on proper tech, freq as well as use of ICE.  Unable to complete due to increased pain which also increased her anxiety.     Follow Up Recommendations  Follow surgeon's recommendation for DC plan and follow-up therapies     Equipment Recommendations  3in1 (PT);Rolling walker with 5" wheels    Recommendations for Other Services       Precautions / Restrictions Precautions Precautions: Fall Precaution Comments: instructed no pillow under knee Restrictions Weight Bearing Restrictions: No    Mobility  Bed Mobility Overal bed mobility: Needs Assistance Bed Mobility: Supine to Sit           General bed mobility comments: demonstarated and instructed how to use a belt loop to self assist LE  Transfers Overall transfer level: Needs assistance Equipment used: Rolling walker (2 wheeled) Transfers: Sit to/from [facility] Sit to Stand: Min assist Stand pivot transfers: Min assist       General transfer comment: increased time with <25% VC's on proper hand placement and safety with turn completion prior to  sit  Ambulation/Gait Ambulation/Gait assistance: Min guard Gait Distance (Feet): 32 Feet Assistive device: Rolling walker (2 wheeled) Gait Pattern/deviations: Step-to pattern;Decreased stride length;Decreased weight shift to left;Decreased stance time - left Gait velocity: decreased   General Gait Details: 25% VC's on proper upright posture and safety with turns.  Distance limited by fatigue and thigh pain.   Stairs             Wheelchair Mobility    Modified Rankin (Stroke Patients Only)       Balance                                            Cognition Arousal/Alertness: Awake/alert Behavior During Therapy: Anxious Overall Cognitive Status: Within Functional Limits for tasks assessed                                 General Comments: pt is nervous/anxious      Exercises   Total Knee Replacement TE's 10 reps B LE ankle pumps 10 reps towel squeezes 10 reps knee presses 10 reps heel slides  10 reps SAQ's 10 reps SLR's 10 reps ABD Followed by ICE     General Comments        Pertinent Vitals/Pain Pain Assessment: 0-10 Pain Score: 3  Pain Location: Lt knee 3/10 but thigh 8/10 Pain Descriptors / Indicators: Aching;Sore;Guarding;Grimacing Pain  Intervention(s): Monitored during session;Premedicated before session;Repositioned;Ice applied    Home Living                      Prior Function            PT Goals (current goals can now be found in the care plan section) Progress towards PT goals: Progressing toward goals    Frequency    7X/week      PT Plan Current plan remains appropriate    Co-evaluation              AM-PAC PT "6 Clicks" Mobility   Outcome Measure  Help needed turning from your back to your side while in a flat bed without using bedrails?: A Little Help needed moving from lying on your back to sitting on the side of a flat bed without using bedrails?: A Little Help needed  moving to and from a bed to a chair (including a wheelchair)?: A Little Help needed standing up from a chair using your arms (e.g., wheelchair or bedside chair)?: A Little Help needed to walk in hospital room?: A Little Help needed climbing 3-5 steps with a railing? : A Lot 6 Click Score: 17    End of Session Equipment Utilized During Treatment: Gait belt Activity Tolerance: Patient limited by pain Patient left: in chair   PT Visit Diagnosis: Muscle weakness (generalized) (M62.81);Difficulty in walking, not elsewhere classified (R26.2)     Time: [id number] PT Time Calculation (min) (ACUTE ONLY): 28 min  Charges:  $Gait Training: 8-22 mins $Therapeutic Exercise: 8-22 mins                     Rica Koyanagi  PTA Acute  Rehabilitation Services Pager      [phone] Office      [phone]

## 2019-04-30 NOTE — Progress Notes (Signed)
Pt. seen for CPAP order, has been tolerating well w/o CPAP, aware to notify if wanting one set up.

## 2019-04-30 NOTE — Progress Notes (Addendum)
Pt requested non-emergency medical transport home at time of discharge, social worker Elmyra Ricks notified.

## 2019-05-01 LAB — BASIC METABOLIC PANEL
Anion gap: 14 (ref 5–15)
BUN: 9 mg/dL (ref 6–20)
CO2: 23 mmol/L (ref 22–32)
Calcium: 8.5 mg/dL — ABNORMAL LOW (ref 8.9–10.3)
Chloride: 103 mmol/L (ref 98–111)
Creatinine, Ser: 0.46 mg/dL (ref 0.44–1.00)
GFR calc Af Amer: >60 mL/min (ref >60)
GFR calc non Af Amer: >60 mL/min (ref >60)
Glucose, Bld: 107 mg/dL — ABNORMAL HIGH (ref 70–99)
Potassium: 3.2 mmol/L — ABNORMAL LOW (ref 3.5–5.1)
Sodium: 140 mmol/L (ref 135–145)

## 2019-05-01 LAB — CBC
HCT: 35.8 % — ABNORMAL LOW (ref 36.0–46.0)
Hemoglobin: 11.8 g/dL — ABNORMAL LOW (ref 12.0–15.0)
MCH: 30.1 pg (ref 26.0–34.0)
MCHC: 33 g/dL (ref 30.0–36.0)
MCV: 91.3 fL (ref 80.0–100.0)
Platelets: 246 10*3/uL (ref 150–400)
RBC: 3.92 MIL/uL (ref 3.87–5.11)
RDW: 12.9 % (ref 11.5–15.5)
WBC: 10.6 10*3/uL — ABNORMAL HIGH (ref 4.0–10.5)
nRBC: 0 % (ref 0.0–0.2)

## 2019-05-01 NOTE — TOC Transition Note (Signed)
Transition of Care [facility]) - CM/SW Discharge Note   Patient Details  Name: [name] MRN: [id number] Date of Birth: [birth year]  [facility] CM/SW Contact:  Lia Hopping, [place] Phone Number: 05/01/2019, 2:44 PM   Clinical Narrative:    Final therapy Plan: HHPT CSW met with the patient at bedside and provided choice. Patient prefers an agency that is in network with her insurance. CSW reached out to [facility] and confirm staff availability.  A rep. From [facility] agency will reach out to the patient within 24 hours of her discharge. Patient reports understanding.  Patient feels she is progressing slowly and not quite ready to conquer the stairs. Patient has requested to transport by PTAR to home for her safety.    Final next level of care: [place] Barriers to Discharge: No Barriers Identified   Patient Goals and CMS Choice Patient states their goals for this hospitalization and ongoing recovery are:: "We are sticking through this, I am going get through this with my family." CMS Medicare.gov Compare Post Acute Care list provided to:: Patient Choice offered to / list presented to : Patient  Discharge Placement                Patient to be transferred to facility by: PTAR-Home      Discharge Plan and Services                DME Arranged: Gilford Rile rolling, 3-N-1 DME Agency: [employer] Date DME Agency Contacted: 04/30/19 Time DME Agency Contacted: 0900 Representative spoke with at DME Agency: Ovid Curd HH Arranged: PT [place]: [facility] [employer] Date [place]: 05/01/19 Time [place]: 1443 Representative spoke with at [place]:  ([place]) Interventions     Readmission Risk Interventions No flowsheet data found.

## 2019-05-01 NOTE — Progress Notes (Signed)
Patient discharged to home via PTAR. All belongings w/ patient.

## 2019-05-01 NOTE — Progress Notes (Addendum)
Physical Therapy Treatment Patient Details Name: [name] MRN: [id number] DOB: [birth year] Today's Date: 05/01/2019    History of Present Illness Patient is 48 y.o. female s/p Lt TKA on 04/29/19 with PMH significant for cervical cancer, HTN, anxiety.    PT Comments    POD # 2 am session Pt progressing slowly.  General transfer comment: still difficult to self rise from recliner due to L thigh pain, weak B UE's and "other knee is bad". General Gait Details: slow progress present with limited distance due to L knee pain, weak B UE's to support her body weight and "other knee is bad". Unsteady esp with turns and back gait to bed/chair. General stair comments: unable to perform as pt was unable to weight shift onto L LE enough to off load her weight fully to advnace R LE up onto step. Pt requesting a non emergency transport home today as she has 5 steps to enter her home. Assisted back to bed. General bed mobility comments: demonstarted and instructed how to use belt to self assist LE up onto bed but still required assist from therapist due to pain.  Performed some TE's.  AAROM L knee 10 - 42 degrees during heel slides.  Educated on importance of early TE.  Pt unable to perform stairs for D/Ctoday most likely will be unable to perform stairs for OP visits.  Pt is a HIGH FALL RISK due to her "other knee is bad" and body habitus.  Pt would benefit from [facility] PT after D/C.  Reported to CN and Therapist, sports.    Follow Up Recommendations  Home health PT      Equipment Recommendations  3in1 (PT);Rolling walker with 5" wheels    Recommendations for Other Services       Precautions / Restrictions Precautions Precautions: Fall Precaution Comments: instructed no pillow under knee Restrictions Weight Bearing Restrictions: No Other Position/Activity Restrictions: WBAT    Mobility  Bed Mobility Overal bed mobility: Needs Assistance Bed Mobility: Sit to Supine     Supine to sit: Min assist      General bed mobility comments: demonstarted and instructed how to use belt to self assist LE up onto bed but still required assist from therapist due to pain  Transfers Overall transfer level: Needs assistance Equipment used: Rolling walker (2 wheeled) Transfers: Sit to/from [facility] Sit to Stand: Min assist;Min guard Stand pivot transfers: Min assist       General transfer comment: still difficult to self rise from recliner due to L thigh pain, weak B UE's and "other knee is bad".  Ambulation/Gait Ambulation/Gait assistance: Min guard Gait Distance (Feet): 22 Feet Assistive device: Rolling walker (2 wheeled) Gait Pattern/deviations: Step-to pattern;Decreased stride length;Decreased weight shift to left;Decreased stance time - left Gait velocity: decreased   General Gait Details: slow progress present with limited distance due to L knee pain, weak B UE's to support her body weight and "other knee is bad". Unsteady esp with turns and back gait to bed/chair.   Stairs Stairs: Yes       General stair comments: unable to perform as pt was unable to weight shift onto L LE enough to off load her weight fully to advnace R LE up onto step. Pt requesting a non emergency transport home today as she has 5 steps to enter her home.   Wheelchair Mobility    Modified Rankin (Stroke Patients Only)       Balance  Cognition Arousal/Alertness: Awake/alert Behavior During Therapy: Anxious Overall Cognitive Status: Within Functional Limits for tasks assessed                                 General Comments: a little better than yesterday but remains axious/nervous/worried "how am I going to ........"      Exercises      General Comments        Pertinent Vitals/Pain Pain Assessment: 0-10 Pain Score: 5  Pain Location: L knee 5/10 and L thigh 8/10 Pain Descriptors / Indicators:  Aching;Sore;Guarding;Grimacing Pain Intervention(s): Monitored during session;Repositioned;Ice applied;Premedicated before session    Home Living                      Prior Function            PT Goals (current goals can now be found in the care plan section) Progress towards PT goals: Progressing toward goals    Frequency    7X/week      PT Plan Current plan remains appropriate    Co-evaluation              AM-PAC PT "6 Clicks" Mobility   Outcome Measure  Help needed turning from your back to your side while in a flat bed without using bedrails?: A Little Help needed moving from lying on your back to sitting on the side of a flat bed without using bedrails?: A Little Help needed moving to and from a bed to a chair (including a wheelchair)?: A Little Help needed standing up from a chair using your arms (e.g., wheelchair or bedside chair)?: A Little Help needed to walk in hospital room?: A Little Help needed climbing 3-5 steps with a railing? : Total 6 Click Score: 16    End of Session Equipment Utilized During Treatment: Gait belt Activity Tolerance: Patient limited by pain;Patient limited by fatigue;Other (comment)("other knee is bad") Patient left: in bed;with call bell/phone within reach;with bed alarm set Nurse Communication: Mobility status PT Visit Diagnosis: Muscle weakness (generalized) (M62.81);Difficulty in walking, not elsewhere classified (R26.2)     Time: 1010-1038 PT Time Calculation (min) (ACUTE ONLY): 28 min  Charges:  $Gait Training: 8-22 mins $Therapeutic Exercise: 8-22 mins                     Rica Koyanagi  PTA Acute  Rehabilitation Services Pager      [phone] Office      [phone]

## 2019-05-01 NOTE — Anesthesia Postprocedure Evaluation (Signed)
Anesthesia Post Note  Patient: [name] [facility]  Procedure(s) Performed: TOTAL KNEE ARTHROPLASTY (Left Knee)     Patient location during evaluation: PACU Anesthesia Type: General and Regional Level of consciousness: awake and alert Pain management: pain level controlled Vital Signs Assessment: post-procedure vital signs reviewed and stable Respiratory status: spontaneous breathing, nonlabored ventilation, respiratory function stable and patient connected to nasal cannula oxygen Cardiovascular status: blood pressure returned to baseline and stable Postop Assessment: no apparent nausea or vomiting Anesthetic complications: no    Last Vitals:  Vitals:   04/30/19 2121 05/01/19 0605  BP: 140/82 130/88  Pulse: 88 84  Resp: 16 16  Temp: 37 C 36.7 C  SpO2: 96% 97%    Last Pain:  Vitals:   05/01/19 0700  TempSrc:   PainSc: [place]

## 2019-05-06 DIAGNOSIS — N3000 Acute cystitis without hematuria: Secondary | ICD-10-CM | POA: Diagnosis not present

## 2019-05-06 NOTE — Discharge Summary (Signed)
Physician Discharge Summary   Patient ID: [name] MRN: [id number] DOB/AGE: Mar 02, 1971 49 y.o.  Admit date: 04/29/2019 Discharge date: 05/01/2019  Primary Diagnosis: Primary osteoarthritis left knee   Admission Diagnoses:  Past Medical History:  Diagnosis Date  . 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 cervical ca  . Anal fistula   . Anxiety   . Cervical cancer [facility]) current oncologist -- dr Durwin Reges ([facility])/  previously dr Rayford Halsted [facility]   dx 12/ 2014 invasive cervical cancer--- Stage IB2--- 06-11-2013  s/p  TAH w/ BSO and pelvic node dissection,  completed chemoradiation 04/ 2015  . Endometriosis   . Fibroid   . Hemorrhoids   . History of cancer chemotherapy    FOR CERVICAL CANCER , COMPLETED 08-2013  . History of radiation therapy    FOR CERVICAL CANCER , COMPLETED 08-2013  . Hypercholesteremia   . Hypertension   . IBS (irritable bowel syndrome)   . Migraines   . Nodule of upper lobe of left lung    last CT chest 05-15-2017 (care everywhere in epic)   . OSA on CPAP   . 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 Urinary incontinence    Discharge Diagnoses:   Principal Problem:   OA (osteoarthritis) of knee Active Problems:   Arthritis of knee  Estimated body mass index is 38.12 kg/m as calculated from the following:   Height as of this encounter: 5\' 5"  (1.651 m).   Weight as of this encounter: 103.9 kg.  Procedure:  Procedure(s) (LRB): TOTAL KNEE ARTHROPLASTY (Left)   Consults: None  HPI: [name], 48 y.o. female, has a history of pain and functional disability in the left knee due to arthritis and has failed non-surgical conservative treatments for greater than 12 weeks to includecorticosteriod injections, viscosupplementation injections and activity modification.  Onset of symptoms was gradual, starting 1 years ago with gradually worsening course since that time. The patient  noted no past surgery on the left knee(s).  Patient currently rates pain in the left knee(s) at 8 out of 10 with activity. Patient has worsening of pain with activity and weight bearing, crepitus, joint swelling and instability.  Patient has evidence of bone-on-bone in the medial compartment of the left knee with near bone-on-bone within the patellofemoral compartment by imaging studies. There is no active infection.  Laboratory Data: Admission on 04/29/2019, Discharged on 05/01/2019  Component Date Value Ref Range Status  . WBC 04/30/2019 16.1* 4.0 - 10.5 K/uL Final  . RBC 04/30/2019 4.07  3.87 - 5.11 MIL/uL Final  . Hemoglobin 04/30/2019 12.1  12.0 - 15.0 g/dL Final  . HCT 04/30/2019 37.3  36.0 - 46.0 % Final  . MCV 04/30/2019 91.6  80.0 - 100.0 fL Final  . MCH 04/30/2019 29.7  26.0 - 34.0 pg Final  . MCHC 04/30/2019 32.4  30.0 - 36.0 g/dL Final  . RDW 04/30/2019 12.4  11.5 - 15.5 % Final  . Platelets 04/30/2019 294  150 - 400 K/uL Final  . nRBC 04/30/2019 0.0  0.0 - 0.2 % Final   Performed at [facility], [place] [address], [place], [place] [address]  . Sodium 04/30/2019 136  135 - 145 mmol/L Final  . Potassium 04/30/2019 3.8  3.5 - 5.1 mmol/L Final  . Chloride 04/30/2019 101  98 - 111 mmol/L Final  . CO2 04/30/2019 24  22 - 32  mmol/L Final  . Glucose, Bld 04/30/2019 127* 70 - 99 mg/dL Final  . BUN 04/30/2019 13  6 - 20 mg/dL Final  . Creatinine, Ser 04/30/2019 0.62  0.44 - 1.00 mg/dL Final  . Calcium 04/30/2019 8.8* 8.9 - 10.3 mg/dL Final  . GFR calc non Af Amer 04/30/2019 >60  >60 mL/min Final  . GFR calc Af Amer 04/30/2019 >60  >60 mL/min Final  . Anion gap 04/30/2019 11  5 - 15 Final   Performed at [facility], [place] [address], [place], [place] [address]  . WBC 05/01/2019 10.6* 4.0 - 10.5 K/uL Final  . RBC 05/01/2019 3.92  3.87 - 5.11 MIL/uL Final  . Hemoglobin 05/01/2019 11.8* 12.0 - 15.0 g/dL Final  . HCT 05/01/2019 35.8* 36.0 - 46.0 % Final   . MCV 05/01/2019 91.3  80.0 - 100.0 fL Final  . MCH 05/01/2019 30.1  26.0 - 34.0 pg Final  . MCHC 05/01/2019 33.0  30.0 - 36.0 g/dL Final  . RDW 05/01/2019 12.9  11.5 - 15.5 % Final  . Platelets 05/01/2019 246  150 - 400 K/uL Final  . nRBC 05/01/2019 0.0  0.0 - 0.2 % Final   Performed at [facility], [place] [address] [place], [place] [address]  . Sodium 05/01/2019 140  135 - 145 mmol/L Final  . Potassium 05/01/2019 3.2* 3.5 - 5.1 mmol/L Final  . Chloride 05/01/2019 103  98 - 111 mmol/L Final  . CO2 05/01/2019 23  22 - 32 mmol/L Final  . Glucose, Bld 05/01/2019 107* 70 - 99 mg/dL Final  . BUN 05/01/2019 9  6 - 20 mg/dL Final  . Creatinine, Ser 05/01/2019 0.46  0.44 - 1.00 mg/dL Final  . Calcium 05/01/2019 8.5* 8.9 - 10.3 mg/dL Final  . GFR calc non Af Amer 05/01/2019 >60  >60 mL/min Final  . GFR calc Af Amer 05/01/2019 >60  >60 mL/min Final  . Anion gap 05/01/2019 14  5 - 15 Final   Performed at [facility], [place] [address] [place],  [address]  Hospital Outpatient Visit on 04/25/2019  Component Date Value Ref Range Status  . SARS-CoV-2, NAA 04/25/2019 NOT DETECTED  NOT DETECTED Final   Comment: (NOTE) This nucleic acid amplification test was developed and its performance characteristics determined by [employer]. Nucleic acid amplification tests include PCR and TMA. This test has not been FDA cleared or approved. This test has been authorized by FDA under an Emergency Use Authorization (EUA). This test is only authorized for the duration of time the declaration that circumstances exist justifying the authorization of the emergency use of in vitro diagnostic tests for detection of SARS-CoV-2 virus and/or diagnosis of COVID-19 infection under section 564(b)(1) of the Act, 21 U.S.C. [id number]) (1), unless the authorization is terminated or revoked sooner. When diagnostic testing is negative, the possibility of a false negative  result should be considered in the context of a patient's recent exposures and the presence of clinical signs and symptoms consistent with COVID-19. An individual without symptoms of COVID- 19 and who is not shedding SARS-CoV-2 vi                          rus would expect to have a negative (not detected) result in this assay. Performed At: [facility] [place], [place] [id number] Rush Farmer MD [id number]   . Coronavirus Source 04/25/2019 NASOPHARYNGEAL   Final  Performed at [facility], [place] [address], [place], [place] [address]  Hospital Outpatient Visit on 04/25/2019  Component Date Value Ref Range Status  . WBC 04/25/2019 7.8  4.0 - 10.5 K/uL Final  . RBC 04/25/2019 4.70  3.87 - 5.11 MIL/uL Final  . Hemoglobin 04/25/2019 14.3  12.0 - 15.0 g/dL Final  . HCT 04/25/2019 43.6  36.0 - 46.0 % Final  . MCV 04/25/2019 92.8  80.0 - 100.0 fL Final  . MCH 04/25/2019 30.4  26.0 - 34.0 pg Final  . MCHC 04/25/2019 32.8  30.0 - 36.0 g/dL Final  . RDW 04/25/2019 12.8  11.5 - 15.5 % Final  . Platelets 04/25/2019 300  150 - 400 K/uL Final  . nRBC 04/25/2019 0.0  0.0 - 0.2 % Final   Performed at [facility], [place] [address] [place], [place] [address]  . Sodium 04/25/2019 142  135 - 145 mmol/L Final  . Potassium 04/25/2019 3.8  3.5 - 5.1 mmol/L Final  . Chloride 04/25/2019 104  98 - 111 mmol/L Final  . CO2 04/25/2019 29  22 - 32 mmol/L Final  . Glucose, Bld 04/25/2019 93  70 - 99 mg/dL Final  . BUN 04/25/2019 17  6 - 20 mg/dL Final  . Creatinine, Ser 04/25/2019 0.70  0.44 - 1.00 mg/dL Final  . Calcium 04/25/2019 9.2  8.9 - 10.3 mg/dL Final  . Total Protein 04/25/2019 8.0  6.5 - 8.1 g/dL Final  . Albumin 04/25/2019 4.4  3.5 - 5.0 g/dL Final  . AST 04/25/2019 26  15 - 41 U/L Final  . ALT 04/25/2019 51* 0 - 44 U/L Final  . Alkaline Phosphatase 04/25/2019 107  38 - 126 U/L Final  . Total Bilirubin 04/25/2019 2.0* 0.3 - 1.2 mg/dL Final  .  GFR calc non Af Amer 04/25/2019 >60  >60 mL/min Final  . GFR calc Af Amer 04/25/2019 >60  >60 mL/min Final  . Anion gap 04/25/2019 9  5 - 15 Final   Performed at [facility], [place] [address], [place], [place] [address]  . Prothrombin Time 04/25/2019 12.8  11.4 - 15.2 seconds Final  . INR 04/25/2019 1.0  0.8 - 1.2 Final   Comment: (NOTE) INR goal varies based on device and disease states. Performed at [facility], [place] [address] [place], [place] [address]   . aPTT 04/25/2019 32  24 - 36 seconds Final   Performed at [facility], [place] [address] [place], [place] [address]  . ABO/RH(D) 04/25/2019 A POS   Final  . Antibody Screen 04/25/2019 NEG   Final  . Sample Expiration 04/25/2019 05/02/2019,2359   Final  . Extend sample reason 04/25/2019    Final                   Value:NO TRANSFUSIONS OR PREGNANCY IN THE PAST 3 MONTHS Performed at [facility], [place] [address] [place], [place] [address]   . MRSA, PCR 04/25/2019 NEGATIVE  NEGATIVE Final  . Staphylococcus aureus 04/25/2019 POSITIVE* NEGATIVE Final   Comment: (NOTE) The Xpert SA Assay (FDA approved for NASAL specimens in patients 33 years of age and older), is one component of a comprehensive surveillance program. It is not intended to diagnose infection nor to guide or monitor treatment. Performed at [facility], [place] [address] [place],  [address]   . ABO/RH(D) 04/25/2019    Final  Value:A POS Performed at [facility], [place] [address] [place], [place] [address]      Hospital Course: [name] is a 48 y.o. who was admitted to [facility]. They were brought to the operating room on 04/29/2019 and underwent Procedure(s): TOTAL KNEE ARTHROPLASTY.  Patient tolerated the procedure well and was later transferred to the recovery room and then to the orthopaedic floor for  postoperative care.  They were given PO and IV analgesics for pain control following their surgery.  They were given 24 hours of postoperative antibiotics of  Anti-infectives (From admission, onward)   Start     Dose/Rate Route Frequency Ordered Stop   04/29/19 1330  ceFAZolin (ANCEF) IVPB 2g/100 mL premix     2 g 200 mL/hr over 30 Minutes Intravenous Every 6 hours 04/29/19 0940 04/29/19 2046   04/29/19 0600  ceFAZolin (ANCEF) IVPB 2g/100 mL premix     2 g 200 mL/hr over 30 Minutes Intravenous On call to O.R. 04/29/19 [id number] 04/29/19 0758     and started on DVT prophylaxis in the form of Aspirin.   PT and OT were ordered for total joint protocol.  Discharge planning consulted to help with postop disposition and equipment needs.  Patient had a poor night on the evening of surgery. She had issues with pain and nausea that limited PT in extended in recovery requiring her to need inpatient stay.   She got up OOB with therapy on again day one. Hemovac drain was pulled without difficulty.  Continued to work with therapy into day two.  Dressing was changed on day two and the incision was clean and dry.  The patient had progressed with therapy and meeting their goals.  Incision was healing well.  Patient was seen in rounds and was ready to go home.   Diet: Cardiac diet Activity:WBAT Follow-up:in 2 weeks Disposition - Home Discharged Condition: stable   Discharge Instructions    Call MD / Call 911   Complete by: As directed    If you experience chest pain or shortness of breath, CALL 911 and be transported to the hospital emergency room.  If you develope a fever above 101 F, pus (white drainage) or increased drainage or redness at the wound, or calf pain, call your surgeon's office.   Constipation Prevention   Complete by: As directed    Drink plenty of fluids.  Prune juice may be helpful.  You may use a stool softener, such as Colace (over the counter) 100 mg twice a day.  Use MiraLax (over the  counter) for constipation as needed.   Diet - low sodium heart healthy   Complete by: As directed    Increase activity slowly as tolerated   Complete by: As directed      Allergies as of 05/01/2019      Reactions   Amoxicillin Rash   Has patient had a PCN reaction causing immediate rash, facial/tongue/throat swelling, SOB or lightheadedness with hypotension: Unknown Has patient had a PCN reaction causing severe rash involving mucus membranes or skin necrosis: Yes Has patient had a PCN reaction that required hospitalization: No Has patient had a PCN reaction occurring within the last 10 years: No If all of the above answers are "NO", then may proceed with Cephalosporin use.   Penicillins Rash   Has patient had a PCN reaction causing immediate rash, facial/tongue/throat swelling, SOB or lightheadedness with hypotension: Unknown Has patient had a PCN reaction causing severe rash involving  mucus membranes or skin necrosis: Yes Has patient had a PCN reaction that required hospitalization: No Has patient had a PCN reaction occurring within the last 10 years: No If all of the above answers are "NO", then may proceed with Cephalosporin use.   Sulfa Antibiotics Rash   Garlic Diarrhea   Onion Diarrhea   Other Diarrhea, Other (See Comments)   Nuts, dairy, lettuce, peanuts, gluten, corn and wheat dairy, lettuce,  corn       Medication List    STOP taking these medications   aspirin-acetaminophen-caffeine 250-250-65 MG tablet Commonly known as: EXCEDRIN MIGRAINE   diclofenac 75 MG EC tablet Commonly known as: VOLTAREN     TAKE these medications   amLODipine 5 MG tablet Commonly known as: NORVASC Take 5 mg by mouth daily.   aspirin EC 325 MG tablet Take 1 tablet (325 mg total) by mouth daily for 21 days. Then take one 81 mg aspirin once a day for three weeks. Then discontinue aspirin.   atorvastatin 10 MG tablet Commonly known as: LIPITOR Take 10 mg by mouth daily at 6 PM.     chlorproMAZINE 25 MG tablet Commonly known as: THORAZINE Take 25 mg by mouth 3 (three) times daily as needed (migraine headaches.).   cholecalciferol 1000 units tablet Commonly known as: VITAMIN D Take 1,000 Units by mouth daily.   gabapentin 300 MG capsule Commonly known as: Neurontin Take a 300 mg capsule three times a day for two weeks following surgery.Then take a 300 mg capsule two times a day for two weeks. Then take a 300 mg capsule once a day for two weeks. Then discontinue.   methocarbamol 500 MG tablet Commonly known as: Robaxin Take 1 tablet (500 mg total) by mouth every 6 (six) hours as needed for muscle spasms.   multivitamin with minerals Tabs tablet Take 1 tablet by mouth daily.   oxyCODONE 5 MG immediate release tablet Commonly known as: Roxicodone Take 1-2 tablets (5-10 mg total) by mouth every 6 (six) hours as needed for severe pain.   traMADol 50 MG tablet Commonly known as: Ultram Take 1-2 tablets (50-100 mg total) by mouth every 6 (six) hours as needed for moderate pain.   Vyvanse 60 MG capsule Generic drug: lisdexamfetamine Take 60 mg by mouth daily.      Follow-up Information    [place], [place]. Schedule an appointment as soon as possible for a visit in 2 week(s).   Specialty: Orthopedic Surgery Why: Make an appointment for the week of 05/13/19 Contact information: [address] [place] [address] [id number]        [facility] Follow up.   Specialty: [facility] Contact information: [address] [place] [address] [phone]           Signed: Ardeen Jourdain, PA-C Orthopaedic Surgery 05/06/2019, 7:39 AM

## 2019-05-07 DIAGNOSIS — F4320 Adjustment disorder, unspecified: Secondary | ICD-10-CM | POA: Diagnosis not present

## 2019-05-14 DIAGNOSIS — F4320 Adjustment disorder, unspecified: Secondary | ICD-10-CM | POA: Diagnosis not present

## 2019-05-21 DIAGNOSIS — F4320 Adjustment disorder, unspecified: Secondary | ICD-10-CM | POA: Diagnosis not present

## 2019-05-27 DIAGNOSIS — Z6836 Body mass index (BMI) 36.0-36.9, adult: Secondary | ICD-10-CM | POA: Diagnosis not present

## 2019-05-27 DIAGNOSIS — E669 Obesity, unspecified: Secondary | ICD-10-CM | POA: Diagnosis not present

## 2019-05-27 DIAGNOSIS — F5081 Binge eating disorder: Secondary | ICD-10-CM | POA: Diagnosis not present

## 2019-05-27 DIAGNOSIS — Z713 Dietary counseling and surveillance: Secondary | ICD-10-CM | POA: Diagnosis not present

## 2019-05-29 DIAGNOSIS — M25562 Pain in left knee: Secondary | ICD-10-CM | POA: Diagnosis not present

## 2019-05-30 DIAGNOSIS — F5081 Binge eating disorder: Secondary | ICD-10-CM | POA: Diagnosis not present

## 2019-05-30 DIAGNOSIS — F411 Generalized anxiety disorder: Secondary | ICD-10-CM | POA: Diagnosis not present

## 2019-05-30 DIAGNOSIS — F4310 Post-traumatic stress disorder, unspecified: Secondary | ICD-10-CM | POA: Diagnosis not present

## 2019-06-03 DIAGNOSIS — Z96653 Presence of artificial knee joint, bilateral: Secondary | ICD-10-CM | POA: Insufficient documentation

## 2019-06-04 DIAGNOSIS — Z471 Aftercare following joint replacement surgery: Secondary | ICD-10-CM | POA: Diagnosis not present

## 2019-06-04 DIAGNOSIS — M1711 Unilateral primary osteoarthritis, right knee: Secondary | ICD-10-CM | POA: Diagnosis not present

## 2019-06-04 DIAGNOSIS — M25562 Pain in left knee: Secondary | ICD-10-CM | POA: Diagnosis not present

## 2019-06-04 DIAGNOSIS — Z96652 Presence of left artificial knee joint: Secondary | ICD-10-CM | POA: Diagnosis not present

## 2019-06-06 DIAGNOSIS — F4310 Post-traumatic stress disorder, unspecified: Secondary | ICD-10-CM | POA: Diagnosis not present

## 2019-06-06 DIAGNOSIS — F411 Generalized anxiety disorder: Secondary | ICD-10-CM | POA: Diagnosis not present

## 2019-06-13 DIAGNOSIS — F5081 Binge eating disorder: Secondary | ICD-10-CM | POA: Diagnosis not present

## 2019-06-13 DIAGNOSIS — F4310 Post-traumatic stress disorder, unspecified: Secondary | ICD-10-CM | POA: Diagnosis not present

## 2019-06-13 DIAGNOSIS — F411 Generalized anxiety disorder: Secondary | ICD-10-CM | POA: Diagnosis not present

## 2019-06-24 ENCOUNTER — Ambulatory Visit (INDEPENDENT_AMBULATORY_CARE_PROVIDER_SITE_OTHER): Payer: BC Managed Care – PPO | Admitting: Family Medicine

## 2019-06-24 ENCOUNTER — Other Ambulatory Visit: Payer: Self-pay

## 2019-06-24 ENCOUNTER — Encounter: Payer: Self-pay | Admitting: Family Medicine

## 2019-06-24 VITALS — BP 128/88 | HR 77 | Temp 98.0°F | Ht 65.5 in | Wt 222.0 lb

## 2019-06-24 DIAGNOSIS — Z862 Personal history of diseases of the blood and blood-forming organs and certain disorders involving the immune mechanism: Secondary | ICD-10-CM

## 2019-06-24 DIAGNOSIS — K529 Noninfective gastroenteritis and colitis, unspecified: Secondary | ICD-10-CM | POA: Diagnosis not present

## 2019-06-24 DIAGNOSIS — I10 Essential (primary) hypertension: Secondary | ICD-10-CM

## 2019-06-24 DIAGNOSIS — E559 Vitamin D deficiency, unspecified: Secondary | ICD-10-CM | POA: Diagnosis not present

## 2019-06-24 DIAGNOSIS — F50819 Binge eating disorder, unspecified: Secondary | ICD-10-CM

## 2019-06-24 DIAGNOSIS — R197 Diarrhea, unspecified: Secondary | ICD-10-CM

## 2019-06-24 DIAGNOSIS — E669 Obesity, unspecified: Secondary | ICD-10-CM

## 2019-06-24 DIAGNOSIS — E66812 Obesity, class 2: Secondary | ICD-10-CM

## 2019-06-24 DIAGNOSIS — F5081 Binge eating disorder: Secondary | ICD-10-CM

## 2019-06-24 DIAGNOSIS — R5383 Other fatigue: Secondary | ICD-10-CM | POA: Diagnosis not present

## 2019-06-24 DIAGNOSIS — R519 Headache, unspecified: Secondary | ICD-10-CM | POA: Diagnosis not present

## 2019-06-24 DIAGNOSIS — F329 Major depressive disorder, single episode, unspecified: Secondary | ICD-10-CM

## 2019-06-24 DIAGNOSIS — E7800 Pure hypercholesterolemia, unspecified: Secondary | ICD-10-CM

## 2019-06-24 DIAGNOSIS — M9903 Segmental and somatic dysfunction of lumbar region: Secondary | ICD-10-CM | POA: Diagnosis not present

## 2019-06-24 DIAGNOSIS — G4733 Obstructive sleep apnea (adult) (pediatric): Secondary | ICD-10-CM

## 2019-06-24 DIAGNOSIS — G43809 Other migraine, not intractable, without status migrainosus: Secondary | ICD-10-CM

## 2019-06-24 DIAGNOSIS — R7303 Prediabetes: Secondary | ICD-10-CM | POA: Diagnosis not present

## 2019-06-24 DIAGNOSIS — Z87440 Personal history of urinary (tract) infections: Secondary | ICD-10-CM

## 2019-06-24 DIAGNOSIS — Z8719 Personal history of other diseases of the digestive system: Secondary | ICD-10-CM

## 2019-06-24 DIAGNOSIS — M9901 Segmental and somatic dysfunction of cervical region: Secondary | ICD-10-CM | POA: Diagnosis not present

## 2019-06-24 DIAGNOSIS — F32A Depression, unspecified: Secondary | ICD-10-CM

## 2019-06-24 DIAGNOSIS — M9902 Segmental and somatic dysfunction of thoracic region: Secondary | ICD-10-CM | POA: Diagnosis not present

## 2019-06-24 DIAGNOSIS — K580 Irritable bowel syndrome with diarrhea: Secondary | ICD-10-CM

## 2019-06-24 HISTORY — DX: Pure hypercholesterolemia, unspecified: E78.00

## 2019-06-24 HISTORY — DX: Personal history of other diseases of the digestive system: Z87.19

## 2019-06-24 HISTORY — DX: Diarrhea, unspecified: R19.7

## 2019-06-24 HISTORY — DX: Noninfective gastroenteritis and colitis, unspecified: K52.9

## 2019-06-24 NOTE — Progress Notes (Signed)
Subjective:    Patient ID: [name], female    DOB: [birth year], 49 y.o.   MRN: [id number]  HPI Chief Complaint  Patient presents with  . new pt    new pt get established. weight and Gut issues- cholitis-leaky gut- chronic fatigue, get UTI's all the time.    She is new to the practice and here to establish care.  Previous medical care: has not had a PCP but is under the care of several specialists.    Other providers: GI - has seen [facility] in the past. [place]   Cervical oncologist- Dr. Claiborne Billings at [facility] and sees him for annual checks  Rectal surgeon - Dr. Leighton Ruff  Orthopedist- Dr. Lynford Humphrey weight loss clinic- [facility] prescribes Vyvanse adult ADD and Binge Eating Disorder  Chiropractor- Dr. Dionisio David at [facility]    History of cervical cancer in 2015  Knee replacement (left) 04/2019    Reports history of colitis vs IBS.  Hx of colonoscopy at [facility] in 2014 and at [facility] in 2019  Anal fistula  Is requesting a referral to [place].  States she has diarrhea pretty consistently and at times she cannot control the diarrhea.  States she was prescribed medication for GI symptoms and it was not affordable.  Denies fever, chills, unexplained weight loss, abdominal pain, vomiting.   OSA on CPAP- uses CPAP nightly and is doing well with it.   Recurrent UTI- now taking cranberry pills and no UTI in 2 months. Asymptomatic   Fatigue -chronic and is quite bothersome. Is requesting labs for this.   History of migraine headaches and she took triptans and NSAIDs in the past.  Takes chlopromazine as needed. States since seeing her chiropractor she does not have migraines often.   Takes Tylenol PM for her knee pain at night. Recent joint replacement.   HTN- diagnosed 2 years ago approximately. Takes amlodipine   HL- high LDL in past so has been on a statin for over a year   Depression- states "I don't think I'm more depressed than anyone else".  Has taken  Pristiq and Zoloft in the past and had weight gain so she stopped these.   Social history: single mother, never married, is not currently working. 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   Former smoker.    Reviewed allergies, medications, past medical, surgical, family, and social history.    Review of Systems Pertinent positives and negatives in the history of present illness.     Objective:   Physical Exam BP 128/88   Pulse 77   Temp 98 F (36.7 C)   Ht 5' 5.5" (1.664 m)   Wt 222 lb (100.7 kg)   BMI 36.38 kg/m   Alert and in no distress. Neck is supple without adenopathy or thyromegaly. Cardiac exam shows a regular rhythm without murmurs or gallops. Lungs are clear to auscultation. Abdomen is soft, non distended, normal BS, non tender. Extremities without edema. Skin is warm and dry.        Assessment & Plan:  Fatigue, unspecified type - Plan: CBC with Differential/Platelet, T4, free, TSH, Vitamin B12, VITAMIN D 25 Hydroxy (Vit-D Deficiency, Fractures), Iron, TIBC and Ferritin Panel  Essential hypertension - Plan: CBC with Differential/Platelet, Comprehensive metabolic panel  Irritable bowel syndrome with diarrhea - Plan: Ambulatory referral to Gastroenterology  Colitis - Plan: Ambulatory referral to Gastroenterology  Obesity, Class II, BMI 35-39.9  OSA (obstructive sleep apnea)  Binge eating disorder  Depression, unspecified  depression type  Vitamin D insufficiency - Plan: VITAMIN D 25 Hydroxy (Vit-D Deficiency, Fractures)  Pre-diabetes - Plan: Comprehensive metabolic panel, Hemoglobin A1c  Other migraine without status migrainosus, not intractable  Elevated LDL cholesterol level  Diarrhea, unspecified type - Plan: Ambulatory referral to Gastroenterology  History of recurrent UTIs  History of anemia - Plan: CBC with Differential/Platelet, Iron, TIBC and Ferritin Panel

## 2019-06-24 NOTE — Patient Instructions (Signed)
Continue on your current medications.   You should receive a call from [facility]. I recommend having them request your records from [facility].   We will be in touch with your lab results.

## 2019-06-25 ENCOUNTER — Encounter: Payer: Self-pay | Admitting: Gastroenterology

## 2019-06-25 DIAGNOSIS — M25662 Stiffness of left knee, not elsewhere classified: Secondary | ICD-10-CM | POA: Diagnosis not present

## 2019-06-25 DIAGNOSIS — R262 Difficulty in walking, not elsewhere classified: Secondary | ICD-10-CM | POA: Diagnosis not present

## 2019-06-25 DIAGNOSIS — M25562 Pain in left knee: Secondary | ICD-10-CM | POA: Diagnosis not present

## 2019-06-25 DIAGNOSIS — M6281 Muscle weakness (generalized): Secondary | ICD-10-CM | POA: Diagnosis not present

## 2019-06-26 DIAGNOSIS — M9903 Segmental and somatic dysfunction of lumbar region: Secondary | ICD-10-CM | POA: Diagnosis not present

## 2019-06-26 DIAGNOSIS — R519 Headache, unspecified: Secondary | ICD-10-CM | POA: Diagnosis not present

## 2019-06-26 DIAGNOSIS — M9902 Segmental and somatic dysfunction of thoracic region: Secondary | ICD-10-CM | POA: Diagnosis not present

## 2019-06-26 DIAGNOSIS — M9901 Segmental and somatic dysfunction of cervical region: Secondary | ICD-10-CM | POA: Diagnosis not present

## 2019-06-27 DIAGNOSIS — M25662 Stiffness of left knee, not elsewhere classified: Secondary | ICD-10-CM | POA: Diagnosis not present

## 2019-06-27 DIAGNOSIS — M25562 Pain in left knee: Secondary | ICD-10-CM | POA: Diagnosis not present

## 2019-06-27 DIAGNOSIS — M6281 Muscle weakness (generalized): Secondary | ICD-10-CM | POA: Diagnosis not present

## 2019-06-27 DIAGNOSIS — N6314 Unspecified lump in the right breast, lower inner quadrant: Secondary | ICD-10-CM | POA: Diagnosis not present

## 2019-06-27 DIAGNOSIS — N6310 Unspecified lump in the right breast, unspecified quadrant: Secondary | ICD-10-CM | POA: Diagnosis not present

## 2019-06-27 DIAGNOSIS — N6012 Diffuse cystic mastopathy of left breast: Secondary | ICD-10-CM | POA: Diagnosis not present

## 2019-06-27 DIAGNOSIS — R928 Other abnormal and inconclusive findings on diagnostic imaging of breast: Secondary | ICD-10-CM | POA: Diagnosis not present

## 2019-06-27 DIAGNOSIS — R262 Difficulty in walking, not elsewhere classified: Secondary | ICD-10-CM | POA: Diagnosis not present

## 2019-06-27 LAB — IRON,TIBC AND FERRITIN PANEL
Ferritin: 90 ng/mL (ref 15–150)
Iron Saturation: 18 % (ref 15–55)
Iron: 66 ug/dL (ref 27–159)
Total Iron Binding Capacity: 365 ug/dL (ref 250–450)
UIBC: 299 ug/dL (ref 131–425)

## 2019-06-27 LAB — CBC WITH DIFFERENTIAL/PLATELET
Basophils Absolute: 0 10*3/uL (ref 0.0–0.2)
Basos: 1 %
EOS (ABSOLUTE): 0.4 10*3/uL (ref 0.0–0.4)
Eos: 6 %
Hematocrit: 41.7 % (ref 34.0–46.6)
Hemoglobin: 14.4 g/dL (ref 11.1–15.9)
Immature Grans (Abs): 0 10*3/uL (ref 0.0–0.1)
Immature Granulocytes: 0 %
Lymphocytes Absolute: 1.6 10*3/uL (ref 0.7–3.1)
Lymphs: 25 %
MCH: 30.5 pg (ref 26.6–33.0)
MCHC: 34.5 g/dL (ref 31.5–35.7)
MCV: 88 fL (ref 79–97)
Monocytes Absolute: 0.6 10*3/uL (ref 0.1–0.9)
Monocytes: 9 %
Neutrophils Absolute: 3.8 10*3/uL (ref 1.4–7.0)
Neutrophils: 59 %
Platelets: 306 10*3/uL (ref 150–450)
RBC: 4.72 x10E6/uL (ref 3.77–5.28)
RDW: 13.4 % (ref 11.7–15.4)
WBC: 6.3 10*3/uL (ref 3.4–10.8)

## 2019-06-27 LAB — COMPREHENSIVE METABOLIC PANEL
ALT: 57 IU/L — ABNORMAL HIGH (ref 0–32)
AST: 32 IU/L (ref 0–40)
Albumin/Globulin Ratio: 1.5 (ref 1.2–2.2)
Albumin: 4.8 g/dL (ref 3.8–4.8)
Alkaline Phosphatase: 159 IU/L — ABNORMAL HIGH (ref 39–117)
BUN/Creatinine Ratio: 20 (ref 9–23)
BUN: 15 mg/dL (ref 6–24)
Bilirubin Total: 1.3 mg/dL — ABNORMAL HIGH (ref 0.0–1.2)
CO2: 22 mmol/L (ref 20–29)
Calcium: 9.7 mg/dL (ref 8.7–10.2)
Chloride: 99 mmol/L (ref 96–106)
Creatinine, Ser: 0.74 mg/dL (ref 0.57–1.00)
GFR calc Af Amer: 110 mL/min/{1.73_m2} (ref >59)
GFR calc non Af Amer: 95 mL/min/{1.73_m2} (ref >59)
Globulin, Total: 3.1 g/dL (ref 1.5–4.5)
Glucose: 99 mg/dL (ref 65–99)
Potassium: 3.6 mmol/L (ref 3.5–5.2)
Sodium: 141 mmol/L (ref 134–144)
Total Protein: 7.9 g/dL (ref 6.0–8.5)

## 2019-06-27 LAB — VITAMIN D 25 HYDROXY (VIT D DEFICIENCY, FRACTURES): Vit D, 25-Hydroxy: 25.5 ng/mL — ABNORMAL LOW (ref 30.0–100.0)

## 2019-06-27 LAB — T4, FREE: Free T4: 1.43 ng/dL (ref 0.82–1.77)

## 2019-06-27 LAB — VITAMIN B12: Vitamin B-12: 505 pg/mL (ref 232–1245)

## 2019-06-27 LAB — HEMOGLOBIN A1C
Est. average glucose Bld gHb Est-mCnc: 108 mg/dL
Hgb A1c MFr Bld: 5.4 % (ref 4.8–5.6)

## 2019-06-27 LAB — HM MAMMOGRAPHY

## 2019-06-27 LAB — TSH: TSH: 2.85 u[IU]/mL (ref 0.450–4.500)

## 2019-06-28 ENCOUNTER — Encounter: Payer: Self-pay | Admitting: Internal Medicine

## 2019-07-01 DIAGNOSIS — M9903 Segmental and somatic dysfunction of lumbar region: Secondary | ICD-10-CM | POA: Diagnosis not present

## 2019-07-01 DIAGNOSIS — M9902 Segmental and somatic dysfunction of thoracic region: Secondary | ICD-10-CM | POA: Diagnosis not present

## 2019-07-01 DIAGNOSIS — G43719 Chronic migraine without aura, intractable, without status migrainosus: Secondary | ICD-10-CM | POA: Diagnosis not present

## 2019-07-01 DIAGNOSIS — M9901 Segmental and somatic dysfunction of cervical region: Secondary | ICD-10-CM | POA: Diagnosis not present

## 2019-07-01 DIAGNOSIS — R519 Headache, unspecified: Secondary | ICD-10-CM | POA: Diagnosis not present

## 2019-07-02 DIAGNOSIS — Z96652 Presence of left artificial knee joint: Secondary | ICD-10-CM | POA: Diagnosis not present

## 2019-07-02 DIAGNOSIS — F5081 Binge eating disorder: Secondary | ICD-10-CM | POA: Diagnosis not present

## 2019-07-02 DIAGNOSIS — R635 Abnormal weight gain: Secondary | ICD-10-CM | POA: Diagnosis not present

## 2019-07-02 DIAGNOSIS — Z713 Dietary counseling and surveillance: Secondary | ICD-10-CM | POA: Diagnosis not present

## 2019-07-03 DIAGNOSIS — M9901 Segmental and somatic dysfunction of cervical region: Secondary | ICD-10-CM | POA: Diagnosis not present

## 2019-07-03 DIAGNOSIS — M9903 Segmental and somatic dysfunction of lumbar region: Secondary | ICD-10-CM | POA: Diagnosis not present

## 2019-07-03 DIAGNOSIS — R519 Headache, unspecified: Secondary | ICD-10-CM | POA: Diagnosis not present

## 2019-07-03 DIAGNOSIS — M9902 Segmental and somatic dysfunction of thoracic region: Secondary | ICD-10-CM | POA: Diagnosis not present

## 2019-07-05 DIAGNOSIS — M25562 Pain in left knee: Secondary | ICD-10-CM | POA: Diagnosis not present

## 2019-07-05 DIAGNOSIS — M25662 Stiffness of left knee, not elsewhere classified: Secondary | ICD-10-CM | POA: Diagnosis not present

## 2019-07-05 DIAGNOSIS — R262 Difficulty in walking, not elsewhere classified: Secondary | ICD-10-CM | POA: Diagnosis not present

## 2019-07-05 DIAGNOSIS — M6281 Muscle weakness (generalized): Secondary | ICD-10-CM | POA: Diagnosis not present

## 2019-07-09 DIAGNOSIS — M25562 Pain in left knee: Secondary | ICD-10-CM | POA: Diagnosis not present

## 2019-07-09 DIAGNOSIS — R262 Difficulty in walking, not elsewhere classified: Secondary | ICD-10-CM | POA: Diagnosis not present

## 2019-07-09 DIAGNOSIS — M6281 Muscle weakness (generalized): Secondary | ICD-10-CM | POA: Diagnosis not present

## 2019-07-09 DIAGNOSIS — M25662 Stiffness of left knee, not elsewhere classified: Secondary | ICD-10-CM | POA: Diagnosis not present

## 2019-07-10 DIAGNOSIS — R519 Headache, unspecified: Secondary | ICD-10-CM | POA: Diagnosis not present

## 2019-07-10 DIAGNOSIS — M9903 Segmental and somatic dysfunction of lumbar region: Secondary | ICD-10-CM | POA: Diagnosis not present

## 2019-07-10 DIAGNOSIS — M9901 Segmental and somatic dysfunction of cervical region: Secondary | ICD-10-CM | POA: Diagnosis not present

## 2019-07-10 DIAGNOSIS — M9902 Segmental and somatic dysfunction of thoracic region: Secondary | ICD-10-CM | POA: Diagnosis not present

## 2019-07-11 DIAGNOSIS — M542 Cervicalgia: Secondary | ICD-10-CM | POA: Diagnosis not present

## 2019-07-11 DIAGNOSIS — G43719 Chronic migraine without aura, intractable, without status migrainosus: Secondary | ICD-10-CM | POA: Diagnosis not present

## 2019-07-11 DIAGNOSIS — M6281 Muscle weakness (generalized): Secondary | ICD-10-CM | POA: Diagnosis not present

## 2019-07-11 DIAGNOSIS — M25662 Stiffness of left knee, not elsewhere classified: Secondary | ICD-10-CM | POA: Diagnosis not present

## 2019-07-11 DIAGNOSIS — R262 Difficulty in walking, not elsewhere classified: Secondary | ICD-10-CM | POA: Diagnosis not present

## 2019-07-11 DIAGNOSIS — M25562 Pain in left knee: Secondary | ICD-10-CM | POA: Diagnosis not present

## 2019-07-16 DIAGNOSIS — M25662 Stiffness of left knee, not elsewhere classified: Secondary | ICD-10-CM | POA: Diagnosis not present

## 2019-07-16 DIAGNOSIS — R262 Difficulty in walking, not elsewhere classified: Secondary | ICD-10-CM | POA: Diagnosis not present

## 2019-07-16 DIAGNOSIS — M25562 Pain in left knee: Secondary | ICD-10-CM | POA: Diagnosis not present

## 2019-07-16 DIAGNOSIS — M6281 Muscle weakness (generalized): Secondary | ICD-10-CM | POA: Diagnosis not present

## 2019-07-17 DIAGNOSIS — M9903 Segmental and somatic dysfunction of lumbar region: Secondary | ICD-10-CM | POA: Diagnosis not present

## 2019-07-17 DIAGNOSIS — R519 Headache, unspecified: Secondary | ICD-10-CM | POA: Diagnosis not present

## 2019-07-17 DIAGNOSIS — G4733 Obstructive sleep apnea (adult) (pediatric): Secondary | ICD-10-CM | POA: Diagnosis not present

## 2019-07-17 DIAGNOSIS — M9902 Segmental and somatic dysfunction of thoracic region: Secondary | ICD-10-CM | POA: Diagnosis not present

## 2019-07-17 DIAGNOSIS — G4710 Hypersomnia, unspecified: Secondary | ICD-10-CM | POA: Diagnosis not present

## 2019-07-17 DIAGNOSIS — M9901 Segmental and somatic dysfunction of cervical region: Secondary | ICD-10-CM | POA: Diagnosis not present

## 2019-07-18 DIAGNOSIS — M25562 Pain in left knee: Secondary | ICD-10-CM | POA: Diagnosis not present

## 2019-07-18 DIAGNOSIS — M6281 Muscle weakness (generalized): Secondary | ICD-10-CM | POA: Diagnosis not present

## 2019-07-18 DIAGNOSIS — M25662 Stiffness of left knee, not elsewhere classified: Secondary | ICD-10-CM | POA: Diagnosis not present

## 2019-07-18 DIAGNOSIS — R262 Difficulty in walking, not elsewhere classified: Secondary | ICD-10-CM | POA: Diagnosis not present

## 2019-07-23 ENCOUNTER — Encounter: Payer: Self-pay | Admitting: Gastroenterology

## 2019-07-23 ENCOUNTER — Ambulatory Visit (INDEPENDENT_AMBULATORY_CARE_PROVIDER_SITE_OTHER): Payer: BC Managed Care – PPO | Admitting: Gastroenterology

## 2019-07-23 ENCOUNTER — Other Ambulatory Visit (INDEPENDENT_AMBULATORY_CARE_PROVIDER_SITE_OTHER): Payer: BC Managed Care – PPO

## 2019-07-23 VITALS — BP 126/84 | HR 88 | Temp 98.8°F | Ht 64.75 in | Wt 222.2 lb

## 2019-07-23 DIAGNOSIS — R7401 Elevation of levels of liver transaminase levels: Secondary | ICD-10-CM

## 2019-07-23 DIAGNOSIS — K760 Fatty (change of) liver, not elsewhere classified: Secondary | ICD-10-CM | POA: Diagnosis not present

## 2019-07-23 DIAGNOSIS — K529 Noninfective gastroenteritis and colitis, unspecified: Secondary | ICD-10-CM | POA: Diagnosis not present

## 2019-07-23 DIAGNOSIS — R945 Abnormal results of liver function studies: Secondary | ICD-10-CM | POA: Diagnosis not present

## 2019-07-23 LAB — HEPATIC FUNCTION PANEL
ALT: 28 U/L (ref 0–35)
AST: 17 U/L (ref 0–37)
Albumin: 4.6 g/dL (ref 3.5–5.2)
Alkaline Phosphatase: 129 U/L — ABNORMAL HIGH (ref 39–117)
Bilirubin, Direct: 0.3 mg/dL (ref 0.0–0.3)
Total Bilirubin: 1.6 mg/dL — ABNORMAL HIGH (ref 0.2–1.2)
Total Protein: 8.2 g/dL (ref 6.0–8.3)

## 2019-07-23 MED ORDER — VIBERZI 100 MG PO TABS: 100.0000 mg | ORAL_TABLET | Freq: Two times a day (BID) | ORAL | 0 refills | Status: DC

## 2019-07-23 NOTE — Patient Instructions (Addendum)
If you are age 50 or older, your body mass index should be between 23-30. Your Body mass index is 37.27 kg/m. If this is out of the aforementioned range listed, please consider follow up with your Primary Care Provider.  If you are age 77 or younger, your body mass index should be between 19-25. Your Body mass index is 37.27 kg/m. If this is out of the aformentioned range listed, please consider follow up with your Primary Care Provider.    Please go to the lab in the basement of our building to have lab work done as you leave today. Hit "B" for basement when you get on the elevator.  When the doors open the lab is on your left.  We will call you with the results. Thank you.  Due to recent changes in healthcare laws, you may see the results of your imaging and laboratory studies on MyChart before your provider has had a chance to review them.  We understand that in some cases there may be results that are confusing or concerning to you. Not all laboratory results come back in the same time frame and the provider may be waiting for multiple results in order to interpret others.  Please give [place] 48 hours in order for your provider to thoroughly review all the results before contacting the office for clarification of your results.   You have been scheduled for an abdominal ultrasound at [facility] (1st floor of hospital) on Wednesday, 07-31-19 at 8:30am. Please arrive 15 minutes prior to your appointment for registration. Make certain not to have anything to eat or drink 6 hours prior to your appointment. Should you need to reschedule your appointment, please contact radiology at [phone]. This test typically takes about 30 minutes to perform.   We have sent the following medications to your pharmacy for you to pick up at your convenience: Viberzi 100 mg: Take twice a day   Thank you for entrusting me with your care and for choosing [facility], Dr. [place] Cellar

## 2019-07-23 NOTE — Progress Notes (Signed)
HPI :  49 year old female with a history of chronic diarrhea, elevated liver enzymes, history of cervical cancer history of OSA, referred here by Harland Dingwall NP for further management of chronic diarrhea.  Patient states she has had loose watery stools with urgency as long as she can remember, dating back to childhood.  It has been persisting over time and this significantly affects her quality of life.  She has anywhere from 5-12 loose stools per day.  Sometimes can be watery although sometimes just soft.  She does have some urgency with this and has had rare accidents.  No blood in the stools.  She has had history of perirectal abscess with associated fistula in the past between 2016 and 2018 although carries no diagnosis of Crohn's disease or IBD.  She has had prior lower abdominal cramping at times but it has not been bothering her too much lately.  She is quite frustrated by her persistent symptoms and inability to live her life without worrying about where her bathroom is.  She has had prior work-up for this to include a colonoscopy with Dr. Penelope Coop of GI at [facility] done 03/22/2017, remarkable findings include sigmoid diverticulosis, otherwise normal.  Biopsies for microscopic colitis negative.  She has had negative serologic testing for celiac disease.  I also see reports from a colonoscopy with Dr. Roney Mans in [place] from 2014 were random biopsies were negative for microscopic colitis and an EGD with small bowel biopsies were negative for celiac disease.  She has had a CT scan of the abdomen pelvis in 2016 and 2018 without any evidence of small bowel inflammation concerning for Crohn's.  She does not really take much of anything currently for her symptoms.  She has been on Librax in the past for this she states.  She previously was using NSAIDs daily but stopped this.  She has used Imodium in the past but has not controlled things too well for her.  Other ongoing issues include chronic migraines  and ongoing fatigue.  She does have a history of sleep apnea and states she is compliant with her CPAP.  She does have a history of reflux and uses occasional Tums but symptoms are mild and not bothersome to her too much recently.  She otherwise has had a very mild ALT elevation to the low 40s in December and again in February.  Alkaline phosphatase mildly elevated 159, AST 32, ALT 57.  She does not drink any alcohol.  No family history of liver disease.  She is concerned about what is causing the elevated liver enzymes.  She had a recent iron panel which was negative.    Past Medical History:  Diagnosis Date  . 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 cervical ca  . Anal fistula   . Anxiety   . Cervical cancer [facility]) current oncologist -- dr Durwin Reges ([facility])/  previously dr Rayford Halsted 2 [facility]   dx 12/ 2014 invasive cervical cancer--- Stage IB2--- 06-11-2013  s/p  TAH w/ BSO and pelvic node dissection,  completed chemoradiation 04/ 2015  . Endometriosis   . Fibroid   . Hemorrhoids   . History of cancer chemotherapy    FOR CERVICAL CANCER , COMPLETED 08-2013  . History of radiation therapy    FOR CERVICAL CANCER , COMPLETED 08-2013  . HTN (hypertension)   . Hypercholesteremia   . Hypertension   . IBS (irritable bowel syndrome)   .  Migraines   . Nodule of upper lobe of left lung    last CT chest 05-15-2017 (care everywhere in epic)   . OSA on CPAP   . 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 Urinary incontinence      Past Surgical History:  Procedure Laterality Date  . ABDOMINAL HYSTERECTOMY  06/11/2013   TAH/BSO  . BREAST SURGERY Right 11/25/2008   2 benign lumps  . COLONOSCOPY    . COLPOSCOPY    . EVALUATION UNDER ANESTHESIA WITH ANAL FISTULECTOMY N/A 07/06/2017   Procedure: ANAL EXAM UNDER ANESTHESIA;  Surgeon: Leighton Ruff, MD;  Location: [facility];  Service: General;  Laterality: N/A;  .  HERNIA REPAIR     at hysterectomy surgical site  double  . INCISION AND DRAINAGE PERIRECTAL ABSCESS N/A 04/26/2017   Procedure: IRRIGATION AND DEBRIDEMENT PERIRECTAL ABSCESS;  Surgeon: Johnathan Hausen, MD;  Location: [facility];  Service: General;  Laterality: N/A;  . LAPAROSCOPY INCISIONAL HERNIA REPAIR W/ MESH, LYSIS ADHESIONS  01-10-2014   @[facility]   . LIGATION OF INTERNAL FISTULA TRACT N/A 03/08/2018   Procedure: LIGATION OF INTERNAL FISTULA TRACT;  Surgeon: Leighton Ruff, MD;  Location: [facility];  Service: General;  Laterality: N/A;  . [place] N/A 07/06/2017   Procedure: PLACEMENT OF SETON;  Surgeon: Leighton Ruff, MD;  Location: [facility];  Service: General;  Laterality: N/A;  . TOTAL ABDOMINAL HYSTERECTOMY W/ BILATERAL SALPINGOOPHORECTOMY  06-11-2013    dr Rayford Halsted @[facility]    W/ [place]  . TOTAL KNEE ARTHROPLASTY Left 04/29/2019   Procedure: TOTAL KNEE ARTHROPLASTY;  Surgeon: Gaynelle Arabian, MD;  Location: [facility];  Service: Orthopedics;  Laterality: Left;  19min  . WISDOM TOOTH EXTRACTION     Family History  Problem Relation Age of Onset  . Hyperlipidemia Mother   . Hypertension Mother   . Diabetes Father   . Irritable bowel syndrome Father   . Bladder Cancer Father 63  . Diabetes Maternal Grandfather   . Stroke Maternal Grandfather   . Colon cancer Paternal Uncle        ?   Social History   Tobacco Use  . Smoking status: Former Smoker    Quit date: 04/26/1998    Years since quitting: 21.2  . Smokeless tobacco: Never Used  Substance Use Topics  . Alcohol use: Not Currently    Alcohol/week: 0.0 standard drinks    Comment: social  . Drug use: No   Current Outpatient Medications  Medication Sig Dispense Refill  . amLODipine (NORVASC) 5 MG tablet Take 5 mg by mouth daily.    Marland Kitchen atorvastatin (LIPITOR) 10 MG tablet Take 10 mg by mouth daily at 6 PM.    . AZO-CRANBERRY PO Take 2 tablets by mouth daily.    .  Bacillus Coagulans-Inulin (PROBIOTIC-PREBIOTIC PO) Take 2 tablets by mouth daily.    . chlorproMAZINE (THORAZINE) 25 MG tablet Take 25 mg by mouth as needed (migraine headaches.).     Marland Kitchen cholecalciferol (VITAMIN D) 1000 UNITS tablet Take 1,000 Units by mouth daily.    . Cyanocobalamin (VITAMIN B-12 PO) Take 1 tablet by mouth daily.    . Multiple Vitamin (MULTIVITAMIN WITH MINERALS) TABS tablet Take 1 tablet by mouth daily.    . Omega-3 Fatty Acids (FISH OIL PO) Take 2 tablets by mouth daily.    . TURMERIC PO Take 2 tablets by mouth daily.    Marland Kitchen VYVANSE 60 MG  capsule Take 60 mg by mouth daily.     No current facility-administered medications for this visit.   Allergies  Allergen Reactions  . Amoxicillin Rash    Has patient had a PCN reaction causing immediate rash, facial/tongue/throat swelling, SOB or lightheadedness with hypotension: Unknown Has patient had a PCN reaction causing severe rash involving mucus membranes or skin necrosis: Yes Has patient had a PCN reaction that required hospitalization: No Has patient had a PCN reaction occurring within the last 10 years: No If all of the above answers are "NO", then may proceed with Cephalosporin use.   Marland Kitchen Penicillins Rash    Has patient had a PCN reaction causing immediate rash, facial/tongue/throat swelling, SOB or lightheadedness with hypotension: Unknown Has patient had a PCN reaction causing severe rash involving mucus membranes or skin necrosis: Yes Has patient had a PCN reaction that required hospitalization: No Has patient had a PCN reaction occurring within the last 10 years: No If all of the above answers are "NO", then may proceed with Cephalosporin use.   . Sulfa Antibiotics Rash  . Garlic Diarrhea  . Onion Diarrhea  . Other Diarrhea and Other (See Comments)    Nuts, dairy, lettuce, peanuts, gluten, corn and wheat dairy, lettuce,  corn      Review of Systems: All systems reviewed and negative except where noted in HPI.      Lab Results  Component Value Date   ALT 57 (H) 06/24/2019   AST 32 06/24/2019   ALKPHOS 159 (H) 06/24/2019   BILITOT 1.3 (H) 06/24/2019   Lab Results  Component Value Date   WBC 6.3 06/24/2019   HGB 14.4 06/24/2019   HCT 41.7 06/24/2019   MCV 88 06/24/2019   PLT 306 06/24/2019    Physical Exam: BP 126/84 (BP Location: Left Arm, Patient Position: Sitting, Cuff Size: Large)   Pulse 88   Temp 98.8 F (37.1 C)   Ht 5' 4.75" (1.645 m) Comment: heightmeasured without shoes  Wt 222 lb 4 oz (100.8 kg)   BMI 37.27 kg/m  Constitutional: Pleasant,well-developed, female in no acute distress. HEENT: Normocephalic and atraumatic. Conjunctivae are normal. No scleral icterus. Neck supple.  Cardiovascular: Normal rate, regular rhythm.  Pulmonary/chest: Effort normal and breath sounds normal. No wheezing, rales or rhonchi. Abdominal: Soft, nondistended, nontender. There are no masses palpable.  Extremities: no edema Lymphadenopathy: No cervical adenopathy noted. Neurological: Alert and oriented to person place and time. Skin: Skin is warm and dry. No rashes noted. Psychiatric: Normal mood and affect. Behavior is normal.   ASSESSMENT AND PLAN: 49 year old female here for new patient assessment of the following:  Irritable bowel syndrome - I suspect this is the likely diagnosis given her extensive evaluation over the years with 2 - colonoscopies, negative EGD, negative celiac serology, and negative imaging of the small bowel and CT scan x2.  She does have a history of perirectal abscess and fistula but no evidence of small bowel inflammation or Crohn's disease that we are aware of.  She has been taking much of anything for this and has pretty significant symptoms that bother her.  We discussed the diagnosis and treatment options.  Her gallbladder is in place.  I gave her a trial of Viberzi 100 mg twice a day for 4 weeks to see if this helps her.  If it does we will continue using it.  If  not or she has side effects she will let me know and we can try something else, may  consider a trial of cholestyramine.  Further recommendations pending her course.  Abnormal liver enzymes / elevated ALT - mild persistence of ALT elevation.  Iron studies normal.  Discussed differential diagnosis with her, recommend ultrasound to assess for steatosis which is quite possible.  Otherwise we will perform serologic work-up to exclude other chronic liver diseases.  We will relay the results and recommendations to her once we have this back.  I reassured her this elevation is mild and I do not think causing any symptoms currently.  She verbalized understanding   [place]ellar, MD [facility]  CC: Girtha Rm, NP-C

## 2019-07-25 ENCOUNTER — Telehealth: Payer: Self-pay | Admitting: Gastroenterology

## 2019-07-25 DIAGNOSIS — M25662 Stiffness of left knee, not elsewhere classified: Secondary | ICD-10-CM | POA: Diagnosis not present

## 2019-07-25 DIAGNOSIS — R262 Difficulty in walking, not elsewhere classified: Secondary | ICD-10-CM | POA: Diagnosis not present

## 2019-07-25 DIAGNOSIS — M6281 Muscle weakness (generalized): Secondary | ICD-10-CM | POA: Diagnosis not present

## 2019-07-25 DIAGNOSIS — M25562 Pain in left knee: Secondary | ICD-10-CM | POA: Diagnosis not present

## 2019-07-25 NOTE — Telephone Encounter (Signed)
Patient is calling states she had a test done and her Hep A came back positive so she would like to know if its something that will always show up positive even it was years ago.

## 2019-07-25 NOTE — Telephone Encounter (Signed)
Yes I checked her immunity to hep A, as we would vaccinate her to it if she was negative. This simply means she is immune to hep A and would likely be positive in the future. Nothing to worry about. When all of her labs have resulted I will give her my recommendations, some are still pending. Thanks

## 2019-07-25 NOTE — Telephone Encounter (Signed)
Please see patient's question.

## 2019-07-25 NOTE — Telephone Encounter (Signed)
Called patient and gave Dr. Doyne Keel answer to her question about Hep A. Also let her know we will get back with her on the results of her other labs, when they are completed

## 2019-07-27 LAB — ANTI-NUCLEAR AB-TITER (ANA TITER): ANA Titer 1: 1:80 {titer} — ABNORMAL HIGH

## 2019-07-27 LAB — HEPATITIS C ANTIBODY
Hepatitis C Ab: NONREACTIVE
SIGNAL TO CUT-OFF: 0.01 (ref <1.00)

## 2019-07-27 LAB — CERULOPLASMIN: Ceruloplasmin: 39 mg/dL (ref 18–53)

## 2019-07-27 LAB — ANA: Anti Nuclear Antibody (ANA): POSITIVE — AB

## 2019-07-27 LAB — HEPATITIS B SURFACE ANTIGEN: Hepatitis B Surface Ag: NONREACTIVE

## 2019-07-27 LAB — ANTI-SMOOTH MUSCLE ANTIBODY, IGG: Actin (Smooth Muscle) Antibody (IGG): <20 U (ref <20)

## 2019-07-27 LAB — HEPATITIS B SURFACE ANTIBODY,QUALITATIVE: Hep B S Ab: NONREACTIVE

## 2019-07-27 LAB — IGG: IgG (Immunoglobin G), Serum: 1200 mg/dL (ref 600–1640)

## 2019-07-27 LAB — ALPHA-1-ANTITRYPSIN: A-1 Antitrypsin, Ser: 135 mg/dL (ref 83–199)

## 2019-07-27 LAB — MITOCHONDRIAL ANTIBODIES: Mitochondrial M2 Ab, IgG: <=20 U

## 2019-07-27 LAB — HEPATITIS A ANTIBODY, TOTAL: Hepatitis A AB,Total: REACTIVE — AB

## 2019-07-29 ENCOUNTER — Other Ambulatory Visit: Payer: Self-pay

## 2019-07-29 MED ORDER — HEPATITIS B VAC RECOMB ADJ 20 MCG/0.5ML IM SOSY: 0.5000 mL | PREFILLED_SYRINGE | Freq: Once | INTRAMUSCULAR | 0 refills | Status: AC

## 2019-07-30 ENCOUNTER — Telehealth: Payer: Self-pay

## 2019-07-30 ENCOUNTER — Other Ambulatory Visit: Payer: Self-pay

## 2019-07-30 DIAGNOSIS — M25662 Stiffness of left knee, not elsewhere classified: Secondary | ICD-10-CM | POA: Diagnosis not present

## 2019-07-30 DIAGNOSIS — R262 Difficulty in walking, not elsewhere classified: Secondary | ICD-10-CM | POA: Diagnosis not present

## 2019-07-30 DIAGNOSIS — M25562 Pain in left knee: Secondary | ICD-10-CM | POA: Diagnosis not present

## 2019-07-30 DIAGNOSIS — M6281 Muscle weakness (generalized): Secondary | ICD-10-CM | POA: Diagnosis not present

## 2019-07-30 MED ORDER — COLESTIPOL HCL 1 G PO TABS: 1.0000 g | ORAL_TABLET | Freq: Two times a day (BID) | ORAL | 1 refills | Status: DC

## 2019-07-30 NOTE — Telephone Encounter (Signed)
Pt called returning your call. Pt satted she would not be available between 11:45am --1:15pm today.

## 2019-07-30 NOTE — Telephone Encounter (Signed)
Called patient and asked her to stop the Viberzi and that Dr. Havery Moros would like her to try Colestid-1gm BID. Asked her to check with her Pharmacist when she picks it up for timing it with her other medications. Let her know this is a primary cholesterol medication, but it has secondary uses which included treatment for diarrhea.

## 2019-07-30 NOTE — Telephone Encounter (Signed)
See result note.  

## 2019-07-30 NOTE — Telephone Encounter (Signed)
Left message for patient to please call back. Also sent My Chart message to please call our office.

## 2019-07-31 ENCOUNTER — Ambulatory Visit (HOSPITAL_COMMUNITY)
Admission: RE | Admit: 2019-07-31 | Discharge: 2019-07-31 | Disposition: A | Discharge status: Home / Self Care | Payer: BC Managed Care – PPO | Source: Ambulatory Visit | Attending: Gastroenterology | Admitting: Gastroenterology

## 2019-07-31 ENCOUNTER — Other Ambulatory Visit: Payer: Self-pay

## 2019-07-31 DIAGNOSIS — R7401 Elevation of levels of liver transaminase levels: Secondary | ICD-10-CM | POA: Insufficient documentation

## 2019-07-31 DIAGNOSIS — K529 Noninfective gastroenteritis and colitis, unspecified: Secondary | ICD-10-CM | POA: Diagnosis not present

## 2019-07-31 DIAGNOSIS — K7689 Other specified diseases of liver: Secondary | ICD-10-CM | POA: Diagnosis not present

## 2019-07-31 DIAGNOSIS — K824 Cholesterolosis of gallbladder: Secondary | ICD-10-CM | POA: Diagnosis not present

## 2019-08-02 DIAGNOSIS — Z713 Dietary counseling and surveillance: Secondary | ICD-10-CM | POA: Diagnosis not present

## 2019-08-02 DIAGNOSIS — E669 Obesity, unspecified: Secondary | ICD-10-CM | POA: Diagnosis not present

## 2019-08-02 DIAGNOSIS — Z6836 Body mass index (BMI) 36.0-36.9, adult: Secondary | ICD-10-CM | POA: Diagnosis not present

## 2019-08-06 DIAGNOSIS — R262 Difficulty in walking, not elsewhere classified: Secondary | ICD-10-CM | POA: Diagnosis not present

## 2019-08-06 DIAGNOSIS — M25662 Stiffness of left knee, not elsewhere classified: Secondary | ICD-10-CM | POA: Diagnosis not present

## 2019-08-06 DIAGNOSIS — M6281 Muscle weakness (generalized): Secondary | ICD-10-CM | POA: Diagnosis not present

## 2019-08-06 DIAGNOSIS — M25562 Pain in left knee: Secondary | ICD-10-CM | POA: Diagnosis not present

## 2019-08-07 DIAGNOSIS — M9901 Segmental and somatic dysfunction of cervical region: Secondary | ICD-10-CM | POA: Diagnosis not present

## 2019-08-07 DIAGNOSIS — M9902 Segmental and somatic dysfunction of thoracic region: Secondary | ICD-10-CM | POA: Diagnosis not present

## 2019-08-07 DIAGNOSIS — R519 Headache, unspecified: Secondary | ICD-10-CM | POA: Diagnosis not present

## 2019-08-07 DIAGNOSIS — M9903 Segmental and somatic dysfunction of lumbar region: Secondary | ICD-10-CM | POA: Diagnosis not present

## 2019-08-08 DIAGNOSIS — M6281 Muscle weakness (generalized): Secondary | ICD-10-CM | POA: Diagnosis not present

## 2019-08-08 DIAGNOSIS — R262 Difficulty in walking, not elsewhere classified: Secondary | ICD-10-CM | POA: Diagnosis not present

## 2019-08-08 DIAGNOSIS — M25562 Pain in left knee: Secondary | ICD-10-CM | POA: Diagnosis not present

## 2019-08-08 DIAGNOSIS — M25662 Stiffness of left knee, not elsewhere classified: Secondary | ICD-10-CM | POA: Diagnosis not present

## 2019-08-12 ENCOUNTER — Ambulatory Visit (INDEPENDENT_AMBULATORY_CARE_PROVIDER_SITE_OTHER): Payer: BC Managed Care – PPO | Admitting: Gastroenterology

## 2019-08-12 DIAGNOSIS — Z23 Encounter for immunization: Secondary | ICD-10-CM

## 2019-08-16 DIAGNOSIS — Z471 Aftercare following joint replacement surgery: Secondary | ICD-10-CM | POA: Diagnosis not present

## 2019-08-16 DIAGNOSIS — Z96652 Presence of left artificial knee joint: Secondary | ICD-10-CM | POA: Diagnosis not present

## 2019-08-16 DIAGNOSIS — M1711 Unilateral primary osteoarthritis, right knee: Secondary | ICD-10-CM | POA: Diagnosis not present

## 2019-08-26 ENCOUNTER — Encounter: Payer: Self-pay | Admitting: Family Medicine

## 2019-08-26 DIAGNOSIS — R7303 Prediabetes: Secondary | ICD-10-CM | POA: Diagnosis not present

## 2019-08-26 DIAGNOSIS — E669 Obesity, unspecified: Secondary | ICD-10-CM | POA: Diagnosis not present

## 2019-08-26 DIAGNOSIS — I10 Essential (primary) hypertension: Secondary | ICD-10-CM | POA: Diagnosis not present

## 2019-08-26 DIAGNOSIS — F5081 Binge eating disorder: Secondary | ICD-10-CM | POA: Diagnosis not present

## 2019-08-26 MED ORDER — AMLODIPINE BESYLATE 5 MG PO TABS: 5.0000 mg | ORAL_TABLET | Freq: Every day | ORAL | 0 refills | Status: DC

## 2019-08-26 MED ORDER — ATORVASTATIN CALCIUM 10 MG PO TABS: 10.0000 mg | ORAL_TABLET | Freq: Every day | ORAL | 0 refills | Status: DC

## 2019-08-29 DIAGNOSIS — M1711 Unilateral primary osteoarthritis, right knee: Secondary | ICD-10-CM | POA: Diagnosis not present

## 2019-09-03 DIAGNOSIS — M25662 Stiffness of left knee, not elsewhere classified: Secondary | ICD-10-CM | POA: Diagnosis not present

## 2019-09-03 DIAGNOSIS — M25562 Pain in left knee: Secondary | ICD-10-CM | POA: Diagnosis not present

## 2019-09-03 DIAGNOSIS — R262 Difficulty in walking, not elsewhere classified: Secondary | ICD-10-CM | POA: Diagnosis not present

## 2019-09-03 DIAGNOSIS — M25561 Pain in right knee: Secondary | ICD-10-CM | POA: Diagnosis not present

## 2019-09-05 DIAGNOSIS — M1711 Unilateral primary osteoarthritis, right knee: Secondary | ICD-10-CM | POA: Diagnosis not present

## 2019-09-09 DIAGNOSIS — M9902 Segmental and somatic dysfunction of thoracic region: Secondary | ICD-10-CM | POA: Diagnosis not present

## 2019-09-09 DIAGNOSIS — M9903 Segmental and somatic dysfunction of lumbar region: Secondary | ICD-10-CM | POA: Diagnosis not present

## 2019-09-09 DIAGNOSIS — R519 Headache, unspecified: Secondary | ICD-10-CM | POA: Diagnosis not present

## 2019-09-09 DIAGNOSIS — M9901 Segmental and somatic dysfunction of cervical region: Secondary | ICD-10-CM | POA: Diagnosis not present

## 2019-09-11 ENCOUNTER — Encounter: Payer: Self-pay | Admitting: Family Medicine

## 2019-09-12 DIAGNOSIS — M1711 Unilateral primary osteoarthritis, right knee: Secondary | ICD-10-CM | POA: Diagnosis not present

## 2019-09-16 ENCOUNTER — Ambulatory Visit (INDEPENDENT_AMBULATORY_CARE_PROVIDER_SITE_OTHER): Payer: BC Managed Care – PPO | Admitting: Gastroenterology

## 2019-09-16 DIAGNOSIS — M9903 Segmental and somatic dysfunction of lumbar region: Secondary | ICD-10-CM | POA: Diagnosis not present

## 2019-09-16 DIAGNOSIS — Z23 Encounter for immunization: Secondary | ICD-10-CM

## 2019-09-16 DIAGNOSIS — M9901 Segmental and somatic dysfunction of cervical region: Secondary | ICD-10-CM | POA: Diagnosis not present

## 2019-09-16 DIAGNOSIS — M9902 Segmental and somatic dysfunction of thoracic region: Secondary | ICD-10-CM | POA: Diagnosis not present

## 2019-09-16 DIAGNOSIS — R519 Headache, unspecified: Secondary | ICD-10-CM | POA: Diagnosis not present

## 2019-09-18 ENCOUNTER — Encounter: Payer: Self-pay | Admitting: Family Medicine

## 2019-09-18 ENCOUNTER — Ambulatory Visit (INDEPENDENT_AMBULATORY_CARE_PROVIDER_SITE_OTHER): Payer: BC Managed Care – PPO | Admitting: Family Medicine

## 2019-09-18 ENCOUNTER — Other Ambulatory Visit: Payer: Self-pay

## 2019-09-18 VITALS — BP 130/80 | HR 92 | Temp 98.6°F | Wt 233.8 lb

## 2019-09-18 DIAGNOSIS — Z90710 Acquired absence of both cervix and uterus: Secondary | ICD-10-CM | POA: Diagnosis not present

## 2019-09-18 DIAGNOSIS — R5383 Other fatigue: Secondary | ICD-10-CM

## 2019-09-18 DIAGNOSIS — Z833 Family history of diabetes mellitus: Secondary | ICD-10-CM

## 2019-09-18 DIAGNOSIS — C539 Malignant neoplasm of cervix uteri, unspecified: Secondary | ICD-10-CM | POA: Diagnosis not present

## 2019-09-18 DIAGNOSIS — Z8542 Personal history of malignant neoplasm of other parts of uterus: Secondary | ICD-10-CM | POA: Diagnosis not present

## 2019-09-18 DIAGNOSIS — Z08 Encounter for follow-up examination after completed treatment for malignant neoplasm: Secondary | ICD-10-CM | POA: Diagnosis not present

## 2019-09-18 DIAGNOSIS — Z90722 Acquired absence of ovaries, bilateral: Secondary | ICD-10-CM | POA: Diagnosis not present

## 2019-09-18 DIAGNOSIS — G4719 Other hypersomnia: Secondary | ICD-10-CM

## 2019-09-18 DIAGNOSIS — Z9079 Acquired absence of other genital organ(s): Secondary | ICD-10-CM | POA: Diagnosis not present

## 2019-09-18 DIAGNOSIS — E65 Localized adiposity: Secondary | ICD-10-CM | POA: Diagnosis not present

## 2019-09-18 DIAGNOSIS — Z1272 Encounter for screening for malignant neoplasm of vagina: Secondary | ICD-10-CM | POA: Diagnosis not present

## 2019-09-18 DIAGNOSIS — E669 Obesity, unspecified: Secondary | ICD-10-CM

## 2019-09-18 DIAGNOSIS — E559 Vitamin D deficiency, unspecified: Secondary | ICD-10-CM

## 2019-09-18 DIAGNOSIS — Z862 Personal history of diseases of the blood and blood-forming organs and certain disorders involving the immune mechanism: Secondary | ICD-10-CM

## 2019-09-18 DIAGNOSIS — R454 Irritability and anger: Secondary | ICD-10-CM | POA: Diagnosis not present

## 2019-09-18 DIAGNOSIS — L659 Nonscarring hair loss, unspecified: Secondary | ICD-10-CM

## 2019-09-18 DIAGNOSIS — G4733 Obstructive sleep apnea (adult) (pediatric): Secondary | ICD-10-CM

## 2019-09-18 DIAGNOSIS — Z8541 Personal history of malignant neoplasm of cervix uteri: Secondary | ICD-10-CM | POA: Diagnosis not present

## 2019-09-18 DIAGNOSIS — I10 Essential (primary) hypertension: Secondary | ICD-10-CM

## 2019-09-18 NOTE — Progress Notes (Signed)
Subjective:    Patient ID: [name], female    DOB: 1970/09/28, 49 y.o.   MRN: [id number]  HPI Chief Complaint  Patient presents with  . multiple concerns    sent mychart message recently with multiple concerns (see previous message). Chronic fatigue, mood issues- rage- not depression   Complains of a 8-9 week history of severe fatigue and excessive daytime sleepiness.   States she has had a "buffalo hump" for the past 8 years or so and she thinks this is getting bigger.   States she also has a "short fuse and is often irritable".  OSA and uses CPAP since 2016. Uses it nightly and loves it.   Colonoscopy UTD - last one at [facility].  Mammogram UTD   Denies fever, chills, headaches, chest pain, palpitation, shortness of breath, abdominal pain. States she has occasional nght sweats but these are not new. No hot flashes.   Occasional dizziness. Sees chiropractor.  Hx of migraines and this has been under control.   States she has had several surgeries over the past few years Hx of cervical cancer with radical hysterectomy as well as chemo and radiation  Rectal abscesses in 2016. Anal fistula with surgery.   Reports recent eight gain started in March 2021. Lost to 220 in January 2021. Now up to 230 lbs.   Reports trying several diets including WW, Nutrisystem  Dr. Sharol Roussel- homeopathic provider was prescribing her Vyvanse  Has taken Prozac and Pristiq for depression and states this never helped her mood. Has been in counseling but is not currently.   States she does not feel depressed now.   Father with diabetes   Covid vaccines received. Pfizer. Last one more than 3 weeks ago.   Single. Has a daughter in 5 th grade.  Is not working right now.  2 masters degrees, [employer] and [employer].     Review of Systems Pertinent positives and negatives in the history of present illness.     Objective:   Physical Exam BP 130/80   Pulse 92   Temp 98.6 F (37 C)   Wt  233 lb 12.8 oz (106.1 kg)   SpO2 96%   BMI 39.21 kg/m   Alert and in no distress.  Neck is supple without adenopathy or thyromegaly. Cardiac exam shows a regular sinus rhythm without murmurs or gallops. Lungs are clear to auscultation.  Abdomen: Soft, nondistended, nontender.  Extremities without edema.  Skin is warm and dry       Assessment & Plan:  Fatigue, unspecified type - Plan: CBC with Differential/Platelet, Comprehensive metabolic panel, TSH, T4, free, T3, VITAMIN D 25 Hydroxy (Vit-D Deficiency, Fractures), Cortisol, Iron, TIBC and Ferritin Panel, Cortisol, urine, free -review of her EMR shows that she and I have worked her up for fatigue approximately 3 months ago. She is requesting a work up for [employer] and thinks she has this. She has been reading about cushing's disease. Requested a referral to endocrinology. Will check labs and refer as appropriate.   Excessive daytime sleepiness - Plan: CBC with Differential/Platelet, Comprehensive metabolic panel, TSH, T4, free, T3, Cortisol -she is treating OSA by wearing CPAP nightly.  Counseling on good sleep hygiene   Buffalo hump - Plan: Hemoglobin A1c, Cortisol, Cortisol, urine, free -states her PCP brought this up to her at least 8 or 9 years ago.  She believes she has cushing's and is requesting a work up for this.   Irritable - Plan: TSH, T4, free, T3  Obesity, Class II, BMI 35-39.9 - Plan: Comprehensive metabolic panel, TSH, T4, free, T3, Hemoglobin A1c, Cortisol, Cortisol, urine, free She has tried multiple weight loss strategies in the past. Currently working with weight management specialists at [facility].   Family history of diabetes mellitus in father - Plan: Hemoglobin A1c Check A1c and follow up -increases her risk of developing DM  Hair thinning - Plan: CBC with Differential/Platelet, Comprehensive metabolic panel, TSH, T4, free, T3, Cortisol, Iron, TIBC and Ferritin Panel, Cortisol, urine, free -nothing obvious on exam   Essential hypertension -controlled on medication.   History of anemia - Plan: CBC with Differential/Platelet, Iron, TIBC and Ferritin Panel -check CBC and iron studies  Vitamin D insufficiency - Plan: VITAMIN D 25 Hydroxy (Vit-D Deficiency, Fractures) -adjust supplemental dose of vitamin D once I have vitamin D level   OSA (obstructive sleep apnea) -reports using CPAP nightly and benefiting from it.

## 2019-09-19 ENCOUNTER — Encounter: Payer: Self-pay | Admitting: Family Medicine

## 2019-09-20 ENCOUNTER — Other Ambulatory Visit: Payer: BC Managed Care – PPO

## 2019-09-20 ENCOUNTER — Other Ambulatory Visit: Payer: Self-pay

## 2019-09-20 DIAGNOSIS — R454 Irritability and anger: Secondary | ICD-10-CM

## 2019-09-20 DIAGNOSIS — G4719 Other hypersomnia: Secondary | ICD-10-CM | POA: Diagnosis not present

## 2019-09-20 DIAGNOSIS — E66812 Obesity, class 2: Secondary | ICD-10-CM

## 2019-09-20 DIAGNOSIS — Z862 Personal history of diseases of the blood and blood-forming organs and certain disorders involving the immune mechanism: Secondary | ICD-10-CM

## 2019-09-20 DIAGNOSIS — R5383 Other fatigue: Secondary | ICD-10-CM

## 2019-09-20 DIAGNOSIS — E559 Vitamin D deficiency, unspecified: Secondary | ICD-10-CM

## 2019-09-20 DIAGNOSIS — E669 Obesity, unspecified: Secondary | ICD-10-CM

## 2019-09-20 DIAGNOSIS — E65 Localized adiposity: Secondary | ICD-10-CM

## 2019-09-20 DIAGNOSIS — Z833 Family history of diabetes mellitus: Secondary | ICD-10-CM

## 2019-09-20 DIAGNOSIS — L659 Nonscarring hair loss, unspecified: Secondary | ICD-10-CM

## 2019-09-21 LAB — COMPREHENSIVE METABOLIC PANEL
ALT: 36 IU/L — ABNORMAL HIGH (ref 0–32)
AST: 23 IU/L (ref 0–40)
Albumin/Globulin Ratio: 1.6 (ref 1.2–2.2)
Albumin: 4.5 g/dL (ref 3.8–4.8)
Alkaline Phosphatase: 129 IU/L — ABNORMAL HIGH (ref 39–117)
BUN/Creatinine Ratio: 19 (ref 9–23)
BUN: 12 mg/dL (ref 6–24)
Bilirubin Total: 1.1 mg/dL (ref 0.0–1.2)
CO2: 23 mmol/L (ref 20–29)
Calcium: 9.3 mg/dL (ref 8.7–10.2)
Chloride: 102 mmol/L (ref 96–106)
Creatinine, Ser: 0.62 mg/dL (ref 0.57–1.00)
GFR calc Af Amer: 122 mL/min/{1.73_m2} (ref >59)
GFR calc non Af Amer: 106 mL/min/{1.73_m2} (ref >59)
Globulin, Total: 2.8 g/dL (ref 1.5–4.5)
Glucose: 92 mg/dL (ref 65–99)
Potassium: 4.2 mmol/L (ref 3.5–5.2)
Sodium: 141 mmol/L (ref 134–144)
Total Protein: 7.3 g/dL (ref 6.0–8.5)

## 2019-09-21 LAB — TSH: TSH: 2.76 u[IU]/mL (ref 0.450–4.500)

## 2019-09-21 LAB — CBC WITH DIFFERENTIAL/PLATELET
Basophils Absolute: 0.1 10*3/uL (ref 0.0–0.2)
Basos: 1 %
EOS (ABSOLUTE): 0.3 10*3/uL (ref 0.0–0.4)
Eos: 3 %
Hematocrit: 42.3 % (ref 34.0–46.6)
Hemoglobin: 13.9 g/dL (ref 11.1–15.9)
Immature Grans (Abs): 0 10*3/uL (ref 0.0–0.1)
Immature Granulocytes: 0 %
Lymphocytes Absolute: 2.1 10*3/uL (ref 0.7–3.1)
Lymphs: 25 %
MCH: 29.4 pg (ref 26.6–33.0)
MCHC: 32.9 g/dL (ref 31.5–35.7)
MCV: 90 fL (ref 79–97)
Monocytes Absolute: 0.9 10*3/uL (ref 0.1–0.9)
Monocytes: 11 %
Neutrophils Absolute: 4.9 10*3/uL (ref 1.4–7.0)
Neutrophils: 60 %
Platelets: 327 10*3/uL (ref 150–450)
RBC: 4.72 x10E6/uL (ref 3.77–5.28)
RDW: 13.9 % (ref 11.7–15.4)
WBC: 8.2 10*3/uL (ref 3.4–10.8)

## 2019-09-21 LAB — T3: T3, Total: 142 ng/dL (ref 71–180)

## 2019-09-21 LAB — IRON,TIBC AND FERRITIN PANEL
Ferritin: 82 ng/mL (ref 15–150)
Iron Saturation: 19 % (ref 15–55)
Iron: 70 ug/dL (ref 27–159)
Total Iron Binding Capacity: 359 ug/dL (ref 250–450)
UIBC: 289 ug/dL (ref 131–425)

## 2019-09-21 LAB — T4, FREE: Free T4: 1.21 ng/dL (ref 0.82–1.77)

## 2019-09-21 LAB — VITAMIN D 25 HYDROXY (VIT D DEFICIENCY, FRACTURES): Vit D, 25-Hydroxy: 28.5 ng/mL — ABNORMAL LOW (ref 30.0–100.0)

## 2019-09-21 LAB — HEMOGLOBIN A1C
Est. average glucose Bld gHb Est-mCnc: 111 mg/dL
Hgb A1c MFr Bld: 5.5 % (ref 4.8–5.6)

## 2019-09-21 LAB — CORTISOL: Cortisol: 6.2 ug/dL

## 2019-09-23 ENCOUNTER — Encounter: Payer: Self-pay | Admitting: Family Medicine

## 2019-09-26 ENCOUNTER — Encounter: Payer: Self-pay | Admitting: Family Medicine

## 2019-09-29 LAB — CORTISOL, URINE, FREE
Cortisol (Ur), Free: 27 ug/24 hr (ref 6–42)
Cortisol,F,ug/L,U: 17 ug/L

## 2019-09-30 DIAGNOSIS — M9903 Segmental and somatic dysfunction of lumbar region: Secondary | ICD-10-CM | POA: Diagnosis not present

## 2019-09-30 DIAGNOSIS — M9902 Segmental and somatic dysfunction of thoracic region: Secondary | ICD-10-CM | POA: Diagnosis not present

## 2019-09-30 DIAGNOSIS — M9901 Segmental and somatic dysfunction of cervical region: Secondary | ICD-10-CM | POA: Diagnosis not present

## 2019-09-30 DIAGNOSIS — G43719 Chronic migraine without aura, intractable, without status migrainosus: Secondary | ICD-10-CM | POA: Diagnosis not present

## 2019-09-30 DIAGNOSIS — R519 Headache, unspecified: Secondary | ICD-10-CM | POA: Diagnosis not present

## 2019-10-10 DIAGNOSIS — G43719 Chronic migraine without aura, intractable, without status migrainosus: Secondary | ICD-10-CM | POA: Diagnosis not present

## 2019-10-10 DIAGNOSIS — M542 Cervicalgia: Secondary | ICD-10-CM | POA: Diagnosis not present

## 2019-10-11 DIAGNOSIS — G4733 Obstructive sleep apnea (adult) (pediatric): Secondary | ICD-10-CM | POA: Diagnosis not present

## 2019-10-11 DIAGNOSIS — G4710 Hypersomnia, unspecified: Secondary | ICD-10-CM | POA: Diagnosis not present

## 2019-10-15 DIAGNOSIS — M9902 Segmental and somatic dysfunction of thoracic region: Secondary | ICD-10-CM | POA: Diagnosis not present

## 2019-10-15 DIAGNOSIS — G4733 Obstructive sleep apnea (adult) (pediatric): Secondary | ICD-10-CM | POA: Diagnosis not present

## 2019-10-15 DIAGNOSIS — G4710 Hypersomnia, unspecified: Secondary | ICD-10-CM | POA: Diagnosis not present

## 2019-10-15 DIAGNOSIS — M9901 Segmental and somatic dysfunction of cervical region: Secondary | ICD-10-CM | POA: Diagnosis not present

## 2019-10-15 DIAGNOSIS — R519 Headache, unspecified: Secondary | ICD-10-CM | POA: Diagnosis not present

## 2019-10-15 DIAGNOSIS — M9903 Segmental and somatic dysfunction of lumbar region: Secondary | ICD-10-CM | POA: Diagnosis not present

## 2019-10-24 ENCOUNTER — Other Ambulatory Visit: Payer: Self-pay

## 2019-10-24 ENCOUNTER — Ambulatory Visit (INDEPENDENT_AMBULATORY_CARE_PROVIDER_SITE_OTHER): Payer: BC Managed Care – PPO | Admitting: Family Medicine

## 2019-10-24 ENCOUNTER — Encounter: Payer: Self-pay | Admitting: Family Medicine

## 2019-10-24 VITALS — BP 130/80 | HR 87 | Wt 239.4 lb

## 2019-10-24 DIAGNOSIS — R9431 Abnormal electrocardiogram [ECG] [EKG]: Secondary | ICD-10-CM

## 2019-10-24 DIAGNOSIS — Z01818 Encounter for other preprocedural examination: Secondary | ICD-10-CM | POA: Diagnosis not present

## 2019-10-24 DIAGNOSIS — G4733 Obstructive sleep apnea (adult) (pediatric): Secondary | ICD-10-CM

## 2019-10-24 DIAGNOSIS — R0609 Other forms of dyspnea: Secondary | ICD-10-CM

## 2019-10-24 DIAGNOSIS — I10 Essential (primary) hypertension: Secondary | ICD-10-CM

## 2019-10-24 DIAGNOSIS — R5383 Other fatigue: Secondary | ICD-10-CM

## 2019-10-24 DIAGNOSIS — R0600 Dyspnea, unspecified: Secondary | ICD-10-CM

## 2019-10-24 NOTE — Patient Instructions (Signed)
I am referring you to cardiology for further evaluation and clearance for your surgery. They will call you to schedule.

## 2019-10-24 NOTE — Progress Notes (Signed)
° °  Subjective:    Patient ID: [name], female    DOB: 1971/01/30, 49 y.o.   MRN: [id number]  HPI Chief Complaint  Patient presents with   surgery clearance    surgery clearance form for knee surgery   She is here for surgery clearance. She has a history of HTN, OSA, prediabetes, chronic fatigue, and morbid obesity, She is having a right total knee replacement December 02, 2019 with Dr. Maureen Ralphs. History of left total knee in December 2020. She did very well with this.   States he has occasional shortness of breath with activity. She is not sure if this is due to anxiety. Denies dizziness, chest pain, palpitations, orthopnea, LE edema.    She has OSA and uses her CPAP nightly.   Reports taking all of her medications as prescribed.   Recently worked up for fatigue and nothing obvious to explain her symptoms.   No fever, chills, abdominal pain, N/V/D or urinary symptoms.    Reviewed allergies, medications, past medical, surgical, family, and social history.    Review of Systems Pertinent positives and negatives in the history of present illness.     Objective:   Physical Exam BP 130/80    Pulse 87    Wt 239 lb 6.4 oz (108.6 kg)    BMI 40.15 kg/m   Alert and in no distress. . Neck is supple without adenopathy or thyromegaly. Cardiac exam shows a regular rhythm without murmurs or gallops. Lungs are clear to auscultation. Skin is warm and dry. Mood is normal.        Assessment & Plan:  Pre-operative clearance - Plan: Ambulatory referral to Cardiology -needs clearance from cardiology. Reviewed recent lab results with patient.   Abnormal EKG - Plan: Ambulatory referral to Cardiology, EKG 12-Lead ECG shows NSR, rate 81, nonspecific T waves, no acute changes compared to old EKG from 2020.  -discussed multiple risk factors for heart disease. I will refer to cardiology for surgical clearance.   Morbid obesity ([place]) - Plan: Ambulatory referral to Cardiology -counseling on  diet and exercise. She is hopeful she can be more active after knee replacement.   OSA (obstructive sleep apnea) - Plan: Ambulatory referral to Cardiology -uses CPAP nightly and benefits greatly.   Fatigue, unspecified type -unexplained etiology. Recent work up showed nothing obvious.   Essential hypertension - Plan: Ambulatory referral to Cardiology -fairly well controlled. Continue current medications.   DOE (dyspnea on exertion) - Plan: Ambulatory referral to Cardiology Most likely multifactorial with morbid obesity and anxiety playing roles.

## 2019-10-29 DIAGNOSIS — M9901 Segmental and somatic dysfunction of cervical region: Secondary | ICD-10-CM | POA: Diagnosis not present

## 2019-10-29 DIAGNOSIS — R519 Headache, unspecified: Secondary | ICD-10-CM | POA: Diagnosis not present

## 2019-10-29 DIAGNOSIS — M9903 Segmental and somatic dysfunction of lumbar region: Secondary | ICD-10-CM | POA: Diagnosis not present

## 2019-10-29 DIAGNOSIS — M9902 Segmental and somatic dysfunction of thoracic region: Secondary | ICD-10-CM | POA: Diagnosis not present

## 2019-11-05 ENCOUNTER — Encounter: Payer: Self-pay | Admitting: Medical

## 2019-11-05 ENCOUNTER — Ambulatory Visit: Payer: BC Managed Care – PPO | Admitting: Medical

## 2019-11-05 ENCOUNTER — Other Ambulatory Visit: Payer: Self-pay

## 2019-11-05 VITALS — BP 144/90 | HR 84

## 2019-11-05 DIAGNOSIS — H9201 Otalgia, right ear: Secondary | ICD-10-CM | POA: Diagnosis not present

## 2019-11-05 DIAGNOSIS — H6121 Impacted cerumen, right ear: Secondary | ICD-10-CM

## 2019-11-05 DIAGNOSIS — H9191 Unspecified hearing loss, right ear: Secondary | ICD-10-CM

## 2019-11-05 NOTE — Progress Notes (Signed)
Subjective: Chief Complaint  Patient presents with  . Ear Pain    right    Here for right ear discomfort and hearing decreased.   Was in normal state of health a few days ago, but went swimming this weekend in a pool.  Since then has had muffled hearing right ear, feeling of water trapped in ear, some discomfort. She also has noticed some irritation roof of mouth.  Some sinus pressure but not bad.  otherwise no fever no sore throat, no cough, no nausea or vomiting, no massive headache.  She has never had swimmer's ear.  Not want to get a lot of sinus or ear problems.  She took some over-the-counter [facility] swimmer's ear topical medicine.   Of note she was a little irritated initially but she apologized and stated with all that has happened to her in her health over the years she is just frustrated that she is back at the doctor's office again  Past Medical History:  Diagnosis Date  . 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 cervical ca  . Anal fistula   . Anxiety   . Cervical cancer [facility]) current oncologist -- dr Durwin Reges ([facility])/  previously dr Rayford Halsted 2 [facility]   dx 12/ 2014 invasive cervical cancer--- Stage IB2--- 06-11-2013  s/p  TAH w/ BSO and pelvic node dissection,  completed chemoradiation 04/ 2015  . Endometriosis   . Fibroid   . Hemorrhoids   . History of cancer chemotherapy    FOR CERVICAL CANCER , COMPLETED 08-2013  . History of radiation therapy    FOR CERVICAL CANCER , COMPLETED 08-2013  . HTN (hypertension)   . Hypercholesteremia   . Hypertension   . IBS (irritable bowel syndrome)   . Migraines   . Nodule of upper lobe of left lung    last CT chest 05-15-2017 (care everywhere in epic)   . OSA on CPAP   . 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 Urinary incontinence    Current Outpatient Medications on File Prior to Visit  Medication Sig Dispense Refill  . amLODipine (NORVASC) 5 MG tablet Take 1 tablet (5 mg  total) by mouth daily. 90 tablet 0  . atorvastatin (LIPITOR) 10 MG tablet Take 1 tablet (10 mg total) by mouth daily at 6 PM. 90 tablet 0  . AZO-CRANBERRY PO Take 2 tablets by mouth daily.    . Bacillus Coagulans-Inulin (PROBIOTIC-PREBIOTIC PO) Take 2 tablets by mouth daily.    . chlorproMAZINE (THORAZINE) 25 MG tablet Take 25 mg by mouth as needed (migraine headaches.).     Marland Kitchen cholecalciferol (VITAMIN D) 1000 UNITS tablet Take 1,000 Units by mouth daily.    . Cyanocobalamin (VITAMIN B-12 PO) Take 1 tablet by mouth daily.    . Multiple Vitamin (MULTIVITAMIN WITH MINERALS) TABS tablet Take 1 tablet by mouth daily.    . Omega-3 Fatty Acids (FISH OIL PO) Take 2 tablets by mouth daily.    . TURMERIC PO Take 2 tablets by mouth daily.     No current facility-administered medications on file prior to visit.   ROS as in subjective    Objective: BP (!) 144/90   Pulse 84   SpO2 96%   Gen: wd, wn, nad Left TM and ear canal normal Right TM seemed a little swollen, mild tenderness over the tragus, there appears to be brown impacted wax Nontender of sinuses, face  atraumatic, nares patent no erythema No obvious lesions in side the mouth, MMM, no lesions no swollen tonsils Neck supple nontender no lymphadenopathy    Assessment: Encounter Diagnoses  Name Primary?  . Hearing decreased, right Yes  . Discomfort of right ear   . Impacted cerumen of right ear      Plan: Discussed findings.  Discussed risk/benefits of procedure and patient agrees to procedure. Successfully used warm water lavage to remove impacted cerumen from right ear canal. Patient tolerated procedure well. Advised they avoid using any cotton swabs or other devices to clean the ear canals.  Use basic hygiene as discussed.   After lavage, she felt like her symptoms resolved.   [name] was seen today for ear pain.  Diagnoses and all orders for this visit:  Hearing decreased, right  Discomfort of right ear  Impacted  cerumen of right ear  f/u prn

## 2019-11-06 DIAGNOSIS — M9902 Segmental and somatic dysfunction of thoracic region: Secondary | ICD-10-CM | POA: Diagnosis not present

## 2019-11-06 DIAGNOSIS — M9903 Segmental and somatic dysfunction of lumbar region: Secondary | ICD-10-CM | POA: Diagnosis not present

## 2019-11-06 DIAGNOSIS — M9901 Segmental and somatic dysfunction of cervical region: Secondary | ICD-10-CM | POA: Diagnosis not present

## 2019-11-06 DIAGNOSIS — R519 Headache, unspecified: Secondary | ICD-10-CM | POA: Diagnosis not present

## 2019-11-12 ENCOUNTER — Telehealth: Payer: Self-pay

## 2019-11-12 NOTE — Telephone Encounter (Signed)
   [facility] Pre-operative Risk Assessment    HEARTCARE STAFF: - Please ensure there is not already an duplicate clearance open for this procedure. - Under Visit Info/Reason for Call, type in Other and utilize the format Clearance MM/DD/YY or Clearance TBD. Do not use dashes or single digits. - If request is for dental extraction, please clarify the # of teeth to be extracted.  Request for surgical clearance:  1. What type of surgery is being performed? RIGHT TOTAL KNEE ARTHOPLASTY   2. When is this surgery scheduled? 01/27/2020   3. What type of clearance is required (medical clearance vs. Pharmacy clearance to hold med vs. Both)? MEDICAL   4. Are there any medications that need to be held prior to surgery and how long?  5. Practice name and name of physician performing surgery?  [facility], Dr. Pilar Plate Aluisio   6. What is the office phone number? [phone]    7.   What is the office fax number? [phone] Attn: Glendale Chard  8.   Anesthesia type (None, local, MAC, general) ? Choice   Jacqulynn Cadet 11/12/2019, 4:27 PM  _________________________________________________________________   (provider comments below)

## 2019-11-12 NOTE — Telephone Encounter (Signed)
   Primary Cardiologist: No primary care provider on file.  Chart reviewed as part of pre-operative protocol coverage. Patient has never been seen by our office but does have already an upcoming appointment on 11/20/19 with Dr. Margaretann Loveless for pre-operative clearance. Per office protocol, the provider should assess clearance at time of office visit and should forward their finalized clearance decision to requesting party below. I will remove this message from the pre-op box and route to surgeon to make them aware of appointment pending.  Charlie Pitter, PA-C 11/12/2019, 5:10 PM

## 2019-11-19 NOTE — Progress Notes (Signed)
Cardiology Office Note:    Date:  11/20/2019   ID:  [name], DOB 12/06/1970, MRN [id number]  PCP:  Girtha Rm, NP-C  Cardiologist:  Elouise Munroe, MD  Electrophysiologist:  None   Referring MD: Girtha Rm, NP-C   Chief Complaint: preoperative cardiovascular evaluation.  History of Present Illness:    [name] is a 49 y.o. female here for preop cardiovascular exam prior to right total knee replacement.   Preoperative cardiovascular evaluation Surgery: Right knee arthroplasty, planned for February 03, 2020. Pertinent past cardiac history: HTN, OSA Prior cardiac workup: none History of valve disease: unknown History of CAD/PAD/CVA/TIA: no History of heart failure: no History of arrhythmia: no On anticoagulation: no Hypertension: Yes, on amlodipine Diabetes: prediabetic, no insulin.  Did not tolerate Metformin due to diarrhea. CKD/current creatinine: No CKD, Cr 0.62 OSA: yes, uses CPAP Anesthesia complications: none known, multiple surgeries and no issues. Current symptoms: occasional DOE. No chest pain. Functional capacity: >4 METS, 65 minutes of swimming without CP or SOB. SOB with climbing stairs.   She is quite nervous currently and is burdened with having had multiple surgeries within the past year including abdominal hernia, rectal abscesses, and left knee replacement.  She is eager to lose a bit more weight so that she qualifies for right knee arthroplasty planned by orthopedic surgery.  She has sought out weight management previously, but does not feel she had significant success.  She was started on Vyvanse which she hopes to not have to continue, however she did have some weight loss with this.  No palpitations.  She denies chest pain but does endorse mild shortness of breath with climbing stairs.  She swims at the pool for 65 minutes without chest pain or shortness of breath and does laps.   Denies lifetime heart failure  exacerbation, denies difficulty with anesthesia.  Has had no respiratory decompensation requiring hospitalization or intubation.  Past Medical History:  Diagnosis Date  . 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 cervical ca  . Anal fistula   . Anxiety   . Cervical cancer [facility]) current oncologist -- dr Durwin Reges ([facility])/  previously dr Rayford Halsted [facility]   dx 12/ 2014 invasive cervical cancer--- Stage IB2--- 06-11-2013  s/p  TAH w/ BSO and pelvic node dissection,  completed chemoradiation 04/ 2015  . Endometriosis   . Fibroid   . Hemorrhoids   . History of cancer chemotherapy    FOR CERVICAL CANCER , COMPLETED 08-2013  . History of radiation therapy    FOR CERVICAL CANCER , COMPLETED 08-2013  . HTN (hypertension)   . Hypercholesteremia   . Hypertension   . IBS (irritable bowel syndrome)   . Migraines   . Nodule of upper lobe of left lung    last CT chest 05-15-2017 (care everywhere in epic)   . OSA on CPAP   . 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 Urinary incontinence     Past Surgical History:  Procedure Laterality Date  . ABDOMINAL HYSTERECTOMY  06/11/2013   TAH/BSO  . BREAST SURGERY Right 11/25/2008   2 benign lumps  . COLONOSCOPY    . COLPOSCOPY    . EVALUATION UNDER ANESTHESIA WITH ANAL FISTULECTOMY N/A 07/06/2017   Procedure: ANAL EXAM UNDER ANESTHESIA;  Surgeon: Leighton Ruff, MD;  Location: [place];  Service: General;  Laterality: N/A;  . HERNIA  REPAIR     at hysterectomy surgical site  double  . INCISION AND DRAINAGE PERIRECTAL ABSCESS N/A 04/26/2017   Procedure: IRRIGATION AND DEBRIDEMENT PERIRECTAL ABSCESS;  Surgeon: Johnathan Hausen, MD;  Location: [facility];  Service: General;  Laterality: N/A;  . LAPAROSCOPY INCISIONAL HERNIA REPAIR W/ MESH, LYSIS ADHESIONS  01-10-2014   @[facility]   . LIGATION OF INTERNAL FISTULA TRACT N/A 03/08/2018   Procedure: LIGATION OF INTERNAL FISTULA TRACT;   Surgeon: Leighton Ruff, MD;  Location: [place];  Service: General;  Laterality: N/A;  . [place] N/A 07/06/2017   Procedure: PLACEMENT OF SETON;  Surgeon: Leighton Ruff, MD;  Location: [facility];  Service: General;  Laterality: N/A;  . TOTAL ABDOMINAL HYSTERECTOMY W/ BILATERAL SALPINGOOPHORECTOMY  06-11-2013    dr Rayford Halsted @[facility]    W/ [place]  . TOTAL KNEE ARTHROPLASTY Left 04/29/2019   Procedure: TOTAL KNEE ARTHROPLASTY;  Surgeon: Gaynelle Arabian, MD;  Location: [facility];  Service: Orthopedics;  Laterality: Left;  46min  . WISDOM TOOTH EXTRACTION      Current Medications: Current Meds  Medication Sig  . amLODipine (NORVASC) 5 MG tablet Take 1 tablet (5 mg total) by mouth daily.  Marland Kitchen atorvastatin (LIPITOR) 10 MG tablet Take 1 tablet (10 mg total) by mouth daily at 6 PM.  . AZO-CRANBERRY PO Take 2 tablets by mouth daily.  . chlorproMAZINE (THORAZINE) 25 MG tablet Take 25 mg by mouth daily as needed (migraine headaches).   . cholecalciferol (VITAMIN D) 1000 UNITS tablet Take 1,000 Units by mouth daily.   . Cyanocobalamin (VITAMIN B-12 PO) Take 1 tablet by mouth daily.   . Multiple Vitamin (MULTIVITAMIN WITH MINERALS) TABS tablet Take 1 tablet by mouth daily.  . Omega-3 Fatty Acids (FISH OIL) 1000 MG CAPS Take 1,000 tablets by mouth daily.   Vladimir Faster Glycol-Propyl Glycol (SYSTANE OP) Apply 1 drop to eye daily as needed (dry/irritated eyes).  . TURMERIC PO Take 2 tablets by mouth daily.   . [DISCONTINUED] Bacillus Coagulans-Inulin (PROBIOTIC-PREBIOTIC PO) Take 2 tablets by mouth daily.      Allergies:   Amoxicillin, Penicillins, Sulfa antibiotics, Garlic, Onion, and Other   Social History   Socioeconomic History  . Marital status: Single    Spouse name: Not on file  . Number of children: 1  . Years of education: Not on file  . Highest education level: Not on file  Occupational History  . Not on file  Tobacco Use   . Smoking status: Former Smoker    Quit date: 04/26/1998    Years since quitting: 21.5  . Smokeless tobacco: Never Used  Vaping Use  . Vaping Use: Never used  Substance and Sexual Activity  . Alcohol use: Not Currently    Alcohol/week: 0.0 standard drinks    Comment: social  . Drug use: No  . Sexual activity: Not Currently    Birth control/protection: Surgical    Comment: TAH/BSO  Other Topics Concern  . Not on file  Social History Narrative   ** Merged History Encounter **       Social Determinants of Health   Financial Resource Strain:   . Difficulty of Paying Living Expenses:   Food Insecurity:   . Worried About Charity fundraiser in the Last Year:   . Arboriculturist in the Last Year:   Transportation Needs:   . Film/video editor (Medical):   Marland Kitchen Lack of Transportation (Non-Medical):   Physical  Activity:   . Days of Exercise per Week:   . Minutes of Exercise per Session:   Stress:   . Feeling of Stress :   Social Connections:   . Frequency of Communication with Friends and Family:   . Frequency of Social Gatherings with Friends and Family:   . Attends Religious Services:   . Active Member of Clubs or Organizations:   . Attends Archivist Meetings:   Marland Kitchen Marital Status:      Family History: The patient's family history includes Bladder Cancer (age of onset: 71) in her father; Colon cancer in her paternal uncle; Diabetes in her father and maternal grandfather; Heart disease in her maternal grandfather; Hyperlipidemia in her mother; Hypertension in her mother; Irritable bowel syndrome in her father; Stroke in her maternal grandfather.  ROS:   Please see the history of present illness.    All other systems reviewed and are negative.  EKGs/Labs/Other Studies Reviewed:    The following studies were reviewed today:  EKG:  NSR  Recent Labs: 09/20/2019: ALT 36; BUN 12; Creatinine, Ser 0.62; Hemoglobin 13.9; Platelets 327; Potassium 4.2; Sodium 141; TSH  2.760  Recent Lipid Panel No results found for: CHOL, TRIG, HDL, CHOLHDL, VLDL, LDLCALC, LDLDIRECT  Physical Exam:    VS:  BP (!) 142/84 (BP Location: Right Arm, Patient Position: Sitting, Cuff Size: Large)   Pulse 89   Ht 5\' 4"  (1.626 m)   Wt 241 lb (109.3 kg)   SpO2 95%   BMI 41.37 kg/m     Wt Readings from Last 5 Encounters:  11/20/19 241 lb (109.3 kg)  10/24/19 239 lb 6.4 oz (108.6 kg)  09/18/19 233 lb 12.8 oz (106.1 kg)  07/23/19 222 lb 4 oz (100.8 kg)  06/24/19 222 lb (100.7 kg)     Constitutional: No acute distress Eyes: sclera non-icteric, normal conjunctiva and lids ENMT: Mask in place Cardiovascular: regular rhythm, normal rate, no murmurs. S1 and S2 normal. Radial pulses normal bilaterally. No jugular venous distention.  Respiratory: clear to auscultation bilaterally GI : normal bowel sounds, soft and nontender. No distention.   MSK: extremities warm, well perfused. No edema.  NEURO: grossly nonfocal exam, moves all extremities. PSYCH: alert and oriented x 3, normal mood and affect.   ASSESSMENT:    1. Preoperative cardiovascular examination   2. DOE (dyspnea on exertion)   3. Obesity, Class II, BMI 35-39.9   4. Morbid obesity ([place])   5. Pre-diabetes    PLAN:    Preoperative cardiovascular examination DOE (dyspnea on exertion) - Plan: EKG 12-Lead, ECHOCARDIOGRAM COMPLETE, Amb Ref to Medical Weight Management  She is overall low risk, but endorses dyspnea with climbing stairs.  To appropriately risk stratify her going forward, we will obtain an echocardiogram primarily to evaluate diastolic function.  She has had multiple surgeries in the past year and has tolerated general anesthesia well, therefore I do not anticipate that echocardiogram will impact decisions about preoperative cardiovascular optimization.  Given that the surgery is planned for greater than 30 days from today's date, we will follow up the echocardiogram and make final comments about risk  stratification at a follow-up appointment in September.  Obesity She is significantly struggling with weight management which is critical for her upcoming surgical plan.  I will refer her to the healthy weight and wellness center.  Pre-diabetes - Plan: Amb Ref to Medical Weight Management  Discussed continued exercise, and further discussions with PCP  Hypertension-continue amlodipine.  Total time of  encounter: 60 minutes total time of encounter, including 35 minutes spent in face-to-face patient care on the date of this encounter. This time includes coordination of care and counseling regarding above mentioned problem list. Remainder of non-face-to-face time involved reviewing chart documents/testing relevant to the patient encounter and documentation in the medical record. I have independently reviewed documentation from referring provider.   Cherlynn Kaiser, MD [place]  [facility]    Medication Adjustments/Labs and Tests Ordered: Current medicines are reviewed at length with the patient today.  Concerns regarding medicines are outlined above.  Orders Placed This Encounter  Procedures  . Amb Ref to Medical Weight Management  . EKG 12-Lead  . ECHOCARDIOGRAM COMPLETE   No orders of the defined types were placed in this encounter.   Patient Instructions  Medication Instructions:  NO CHANGES *If you need a refill on your cardiac medications before your next appointment, please call your pharmacy*   Lab Work: NOT NEEDED   Testing/Procedures: WILL BE SCHEDULE AT [place] [address] Your physician has requested that you have an echocardiogram. Echocardiography is a painless test that uses sound waves to create images of your heart. It provides your doctor with information about the size and shape of your heart and how well your heart's chambers and valves are working. This procedure takes approximately one hour. There are no restrictions for this  procedure.     Follow-Up: At [facility], you and your health needs are our priority.  As part of our continuing mission to provide you with exceptional heart care, we have created designated Provider Care Teams.  These Care Teams include your primary Cardiologist (physician) and Advanced Practice Providers (APPs -  Physician Assistants and Nurse Practitioners) who all work together to provide you with the care you need, when you need it.     Your next appointment:   2 month(s) SEPT 13 , 2021  The format for your next appointment:   In Person  Provider:   Cherlynn Kaiser, MD   Other Instructions You have been referred to  [place]

## 2019-11-20 ENCOUNTER — Ambulatory Visit (INDEPENDENT_AMBULATORY_CARE_PROVIDER_SITE_OTHER): Payer: BC Managed Care – PPO | Admitting: Internal Medicine

## 2019-11-20 ENCOUNTER — Other Ambulatory Visit: Payer: Self-pay

## 2019-11-20 ENCOUNTER — Encounter: Payer: Self-pay | Admitting: Internal Medicine

## 2019-11-20 VITALS — BP 142/84 | HR 89 | Ht 64.0 in | Wt 241.0 lb

## 2019-11-20 DIAGNOSIS — R0600 Dyspnea, unspecified: Secondary | ICD-10-CM | POA: Diagnosis not present

## 2019-11-20 DIAGNOSIS — R7303 Prediabetes: Secondary | ICD-10-CM | POA: Diagnosis not present

## 2019-11-20 DIAGNOSIS — Z01810 Encounter for preprocedural cardiovascular examination: Secondary | ICD-10-CM | POA: Diagnosis not present

## 2019-11-20 DIAGNOSIS — R0609 Other forms of dyspnea: Secondary | ICD-10-CM

## 2019-11-20 DIAGNOSIS — Z6841 Body Mass Index (BMI) 40.0 and over, adult: Secondary | ICD-10-CM

## 2019-11-20 NOTE — Patient Instructions (Signed)
Medication Instructions:  NO CHANGES *If you need a refill on your cardiac medications before your next appointment, please call your pharmacy*   Lab Work: NOT NEEDED   Testing/Procedures: WILL BE SCHEDULE AT [place] [address] Your physician has requested that you have an echocardiogram. Echocardiography is a painless test that uses sound waves to create images of your heart. It provides your doctor with information about the size and shape of your heart and how well your heart's chambers and valves are working. This procedure takes approximately one hour. There are no restrictions for this procedure.     Follow-Up: At [facility], you and your health needs are our priority.  As part of our continuing mission to provide you with exceptional heart care, we have created designated Provider Care Teams.  These Care Teams include your primary Cardiologist (physician) and Advanced Practice Providers (APPs -  Physician Assistants and Nurse Practitioners) who all work together to provide you with the care you need, when you need it.     Your next appointment:   2 month(s) SEPT 13 , 2021  The format for your next appointment:   In Person  Provider:   Cherlynn Kaiser, MD   Other Instructions You have been referred to  [place]

## 2019-11-21 ENCOUNTER — Encounter (HOSPITAL_COMMUNITY): Admission: RE | Admit: 2019-11-21 | Payer: BC Managed Care – PPO | Source: Ambulatory Visit

## 2019-11-21 DIAGNOSIS — M9902 Segmental and somatic dysfunction of thoracic region: Secondary | ICD-10-CM | POA: Diagnosis not present

## 2019-11-21 DIAGNOSIS — M9903 Segmental and somatic dysfunction of lumbar region: Secondary | ICD-10-CM | POA: Diagnosis not present

## 2019-11-21 DIAGNOSIS — R519 Headache, unspecified: Secondary | ICD-10-CM | POA: Diagnosis not present

## 2019-11-21 DIAGNOSIS — M9901 Segmental and somatic dysfunction of cervical region: Secondary | ICD-10-CM | POA: Diagnosis not present

## 2019-11-22 ENCOUNTER — Other Ambulatory Visit: Payer: Self-pay | Admitting: Family Medicine

## 2019-12-02 ENCOUNTER — Ambulatory Visit: Admit: 2019-12-02 | Payer: BC Managed Care – PPO | Admitting: Orthopedic Surgery

## 2019-12-02 SURGERY — ARTHROPLASTY, KNEE, TOTAL
Anesthesia: Choice | Site: Knee | Laterality: Right

## 2019-12-04 ENCOUNTER — Encounter (INDEPENDENT_AMBULATORY_CARE_PROVIDER_SITE_OTHER): Payer: Self-pay | Admitting: Family Medicine

## 2019-12-04 ENCOUNTER — Other Ambulatory Visit: Payer: Self-pay

## 2019-12-04 ENCOUNTER — Ambulatory Visit (INDEPENDENT_AMBULATORY_CARE_PROVIDER_SITE_OTHER): Payer: BC Managed Care – PPO | Admitting: Family Medicine

## 2019-12-04 VITALS — BP 121/80 | HR 76 | Temp 97.6°F | Ht 65.0 in | Wt 239.0 lb

## 2019-12-04 DIAGNOSIS — F419 Anxiety disorder, unspecified: Secondary | ICD-10-CM

## 2019-12-04 DIAGNOSIS — E559 Vitamin D deficiency, unspecified: Secondary | ICD-10-CM

## 2019-12-04 DIAGNOSIS — R0602 Shortness of breath: Secondary | ICD-10-CM | POA: Diagnosis not present

## 2019-12-04 DIAGNOSIS — F329 Major depressive disorder, single episode, unspecified: Secondary | ICD-10-CM | POA: Diagnosis not present

## 2019-12-04 DIAGNOSIS — Z6839 Body mass index (BMI) 39.0-39.9, adult: Secondary | ICD-10-CM

## 2019-12-04 DIAGNOSIS — R5383 Other fatigue: Secondary | ICD-10-CM | POA: Diagnosis not present

## 2019-12-04 DIAGNOSIS — Z9189 Other specified personal risk factors, not elsewhere classified: Secondary | ICD-10-CM

## 2019-12-04 DIAGNOSIS — R7303 Prediabetes: Secondary | ICD-10-CM | POA: Diagnosis not present

## 2019-12-04 DIAGNOSIS — F32A Depression, unspecified: Secondary | ICD-10-CM

## 2019-12-04 DIAGNOSIS — I10 Essential (primary) hypertension: Secondary | ICD-10-CM | POA: Diagnosis not present

## 2019-12-04 DIAGNOSIS — G4733 Obstructive sleep apnea (adult) (pediatric): Secondary | ICD-10-CM

## 2019-12-04 DIAGNOSIS — Z0289 Encounter for other administrative examinations: Secondary | ICD-10-CM

## 2019-12-05 LAB — COMPREHENSIVE METABOLIC PANEL
ALT: 47 IU/L — ABNORMAL HIGH (ref 0–32)
AST: 25 IU/L (ref 0–40)
Albumin/Globulin Ratio: 1.5 (ref 1.2–2.2)
Albumin: 4.6 g/dL (ref 3.8–4.8)
Alkaline Phosphatase: 130 IU/L — ABNORMAL HIGH (ref 48–121)
BUN/Creatinine Ratio: 17 (ref 9–23)
BUN: 10 mg/dL (ref 6–24)
Bilirubin Total: 0.8 mg/dL (ref 0.0–1.2)
CO2: 22 mmol/L (ref 20–29)
Calcium: 9.2 mg/dL (ref 8.7–10.2)
Chloride: 103 mmol/L (ref 96–106)
Creatinine, Ser: 0.58 mg/dL (ref 0.57–1.00)
GFR calc Af Amer: 125 mL/min/{1.73_m2} (ref >59)
GFR calc non Af Amer: 109 mL/min/{1.73_m2} (ref >59)
Globulin, Total: 3 g/dL (ref 1.5–4.5)
Glucose: 92 mg/dL (ref 65–99)
Potassium: 4.2 mmol/L (ref 3.5–5.2)
Sodium: 140 mmol/L (ref 134–144)
Total Protein: 7.6 g/dL (ref 6.0–8.5)

## 2019-12-05 LAB — LIPID PANEL WITH LDL/HDL RATIO
Cholesterol, Total: 207 mg/dL — ABNORMAL HIGH (ref 100–199)
HDL: 50 mg/dL (ref >39)
LDL Chol Calc (NIH): 134 mg/dL — ABNORMAL HIGH (ref 0–99)
LDL/HDL Ratio: 2.7 ratio (ref 0.0–3.2)
Triglycerides: 131 mg/dL (ref 0–149)
VLDL Cholesterol Cal: 23 mg/dL (ref 5–40)

## 2019-12-05 LAB — VITAMIN B12: Vitamin B-12: 687 pg/mL (ref 232–1245)

## 2019-12-05 LAB — FOLATE: Folate: 12.4 ng/mL (ref >3.0)

## 2019-12-05 LAB — INSULIN, RANDOM: INSULIN: 26.1 u[IU]/mL — ABNORMAL HIGH (ref 2.6–24.9)

## 2019-12-10 NOTE — Progress Notes (Signed)
Dear Dr. Alveda Reasons,   Thank you for referring Royetta Probus to our clinic. The following note includes my evaluation and treatment recommendations.  Chief Complaint:   OBESITY [name] (MR# [id number]) is a 49 y.o. female who presents for evaluation and treatment of obesity and related comorbidities. Current BMI is Body mass index is 39.77 kg/m. Caedence has been struggling with her weight for many years and has been unsuccessful in either losing weight, maintaining weight loss, or reaching her healthy weight goal.  Jalayiah had a poor experience previously with weight loss clinics. She notes her daughter is a picky eater. [employer] tortilla breakfast burrito if she eats breakfast. Lunch is Subway or McDonald's, or a sandwich ([place] or ham with 4 slices and 1 slice of cheese). Snack is individual bag of chips. Dinner is [employer], [employer] with [employer], cheese, and a few rice cakes or sugar free pecan delights.  Tata is currently in the action stage of change and ready to dedicate time achieving and maintaining a healthier weight. Philamena is interested in becoming our patient and working on intensive lifestyle modifications including (but not limited to) diet and exercise for weight loss.  Sukhman's habits were reviewed today and are as follows: Her family eats meals together, she thinks her family will eat healthier with her, her desired weight loss is 89 lbs, she started gaining weight after her daughter was born, her heaviest weight ever was 257 pounds, she is a picky eater and doesn't like to eat healthier foods, she has significant food cravings issues, she snacks frequently in the evenings, she skips meals frequently, she is frequently drinking liquids with calories, she frequently makes poor food choices, she frequently eats larger portions than normal, she has binge eating behaviors and she struggles with emotional eating.  Depression Screen Rishita's Food and  Mood (modified PHQ-9) score was 21.  Depression screen [facility] 2/9 12/04/2019  Decreased Interest 3  Down, Depressed, Hopeless 3  PHQ - 2 Score 6  Altered sleeping 3  Tired, decreased energy 3  Change in appetite 3  Feeling bad or failure about yourself  3  Trouble concentrating 3  Moving slowly or fidgety/restless 3  Suicidal thoughts 3  PHQ-9 Score 27  Difficult doing work/chores Extremely dIfficult   Subjective:   1. Other fatigue Julee admits to daytime somnolence and admits to waking up still tired. Patent has a history of symptoms of daytime fatigue. Mazie generally gets 5 or 7 hours of sleep per night, and states that she has difficulty falling asleep. Snoring is present. Apneic episodes are not present. Epworth Sleepiness Score is 1. EKG-normal sinus rhythm at 89 BPM.  2. SOB (shortness of breath) Charlett Nose notes increasing shortness of breath with exercising and seems to be worsening over time with weight gain. She notes getting out of breath sooner with activity than she used to. This has not gotten worse recently. Mary-Anne denies shortness of breath at rest or orthopnea.  3. Pre-diabetes Roselina's last A1c was 5.8 1 year ago. She was on metformin previously.  4. Essential hypertension Jaquel's blood pressure is well controlled. She denies chest pain, chest pressure, or headache. Her previous CMP was within normal limits.  5. Vitamin D deficiency Shatoya has a history of Vit D deficiency. She is on 1,000 IU daily.  6. OSA (obstructive sleep apnea) Jasmane was diagnosed in 2016. She wears CPAP nightly, and she is 100% compliant.  7. Anxiety and depression Charlett Nose  tried Pristiq, Prozac, and Zoloft previously. She is not currently on medications. She notes fairly significant emotional eating.  8. At risk for osteoporosis Thersa is at higher risk of osteopenia and osteoporosis due to Vitamin D deficiency.   Assessment/Plan:   1. Other fatigue Eara does feel that her weight is  causing her energy to be lower than it should be. Fatigue may be related to obesity, depression or many other causes. Labs will be ordered, and in the meanwhile, Jodell will focus on self care including making healthy food choices, increasing physical activity and focusing on stress reduction.  - Vitamin B12 - Folate  2. SOB (shortness of breath) Nysia does feel that she gets out of breath more easily that she used to when she exercises. Amra's shortness of breath appears to be obesity related and exercise induced. She has agreed to work on weight loss and gradually increase exercise to treat her exercise induced shortness of breath. Will continue to monitor closely.  - Vitamin B12 - Folate  3. Pre-diabetes Darice will continue to work on weight loss, exercise, and decreasing simple carbohydrates to help decrease the risk of diabetes. We will check labs today.  - Insulin, random  4. Essential hypertension Lucie is working on healthy weight loss and exercise to improve blood pressure control. We will watch for signs of hypotension as she continues her lifestyle modifications. We will check labs today.  - Comprehensive metabolic panel - Lipid Panel With LDL/HDL Ratio  5. Vitamin D deficiency Low Vitamin D level contributes to fatigue and are associated with obesity, breast, and colon cancer. Eara will follow-up for routine testing of Vitamin D, at least 2-3 times per year to avoid over-replacement.  6. OSA (obstructive sleep apnea) Intensive lifestyle modifications are the first line treatment for this issue. We discussed several lifestyle modifications today. Kabella will follow up with her sleep doctor, and will continue to work on diet, exercise and weight loss efforts. We will continue to monitor. Orders and follow up as documented in patient record.   Counseling  Sleep apnea is a condition in which breathing pauses or becomes shallow during sleep. This happens over and over  during the night. This disrupts your sleep and keeps your body from getting the rest that it needs, which can cause tiredness and lack of energy (fatigue) during the day.  Sleep apnea treatment: If you were given a device to open your airway while you sleep, USE IT!  Sleep hygiene:   Limit or avoid alcohol, caffeinated beverages, and cigarettes, especially close to bedtime.   Do not eat a large meal or eat spicy foods right before bedtime. This can lead to digestive discomfort that can make it hard for you to sleep.  Keep a sleep diary to help you and your health care provider figure out what could be causing your insomnia.  . Make your bedroom a dark, comfortable place where it is easy to fall asleep. ? Put up shades or blackout curtains to block light from outside. ? Use a white noise machine to block noise. ? Keep the temperature cool. . Limit screen use before bedtime. This includes: ? Watching TV. ? Using your smartphone, tablet, or computer. . Stick to a routine that includes going to bed and waking up at the same times every day and night. This can help you fall asleep faster. Consider making a quiet activity, such as reading, part of your nighttime routine. . Try to avoid taking naps during the  day so that you sleep better at night. . Get out of bed if you are still awake after 15 minutes of trying to sleep. Keep the lights down, but try reading or doing a quiet activity. When you feel sleepy, go back to bed.  7. Anxiety and depression Behavior modification techniques were discussed today to help Nicholl deal with her anxiety. We will refer to Nilda Riggs for Psychiatry evaluation. Orders and follow up as documented in patient record.   - Ambulatory referral to Psychiatry  8. At risk for osteoporosis Caree was given approximately 15 minutes of osteoporosis prevention counseling today. Jalexus is at risk for osteopenia and osteoporosis due to her Vitamin D deficiency. She was  encouraged to take her Vitamin D and follow her higher calcium diet and increase strengthening exercise to help strengthen her bones and decrease her risk of osteopenia and osteoporosis.  Repetitive spaced learning was employed today to elicit superior memory formation and behavioral change.  9. Class 2 severe obesity with serious comorbidity and body mass index (BMI) of 39.0 to 39.9 in adult, unspecified obesity type [facility]) Leniya is currently in the action stage of change and her goal is to continue with weight loss efforts. I recommend Irina begin the structured treatment plan as follows:  She has agreed to the Category 3 Plan with 8 oz of meat.  Exercise goals: No exercise has been prescribed at this time.   Behavioral modification strategies: increasing lean protein intake, increasing vegetables, meal planning and cooking strategies, keeping healthy foods in the home and planning for success.  She was informed of the importance of frequent follow-up visits to maximize her success with intensive lifestyle modifications for her multiple health conditions. She was informed we would discuss her lab results at her next visit unless there is a critical issue that needs to be addressed sooner. Ritaj agreed to keep her next visit at the agreed upon time to discuss these results.  Objective:   Blood pressure 121/80, pulse 76, temperature 97.6 F (36.4 C), temperature source Oral, height 5\' 5"  (1.651 m), weight 239 lb (108.4 kg), SpO2 96 %. Body mass index is 39.77 kg/m.  EKG: Normal sinus rhythm, rate 89.  Indirect Calorimeter completed today shows a VO2 of 243 and a REE of 1690.  Her calculated basal metabolic rate is 6629 thus her basal metabolic rate is worse than expected.  General: Cooperative, alert, well developed, in no acute distress. HEENT: Conjunctivae and lids unremarkable. Cardiovascular: Regular rhythm.  Lungs: Normal work of breathing. Neurologic: No focal deficits.   Lab  Results  Component Value Date   CREATININE 0.58 12/04/2019   BUN 10 12/04/2019   NA 140 12/04/2019   K 4.2 12/04/2019   CL 103 12/04/2019   CO2 22 12/04/2019   Lab Results  Component Value Date   ALT 47 (H) 12/04/2019   AST 25 12/04/2019   ALKPHOS 130 (H) 12/04/2019   BILITOT 0.8 12/04/2019   Lab Results  Component Value Date   HGBA1C 5.5 09/20/2019   HGBA1C 5.4 06/24/2019   HGBA1C 5.4 04/27/2017   Lab Results  Component Value Date   INSULIN 26.1 (H) 12/04/2019   Lab Results  Component Value Date   TSH 2.760 09/20/2019   Lab Results  Component Value Date   CHOL 207 (H) 12/04/2019   HDL 50 12/04/2019   LDLCALC 134 (H) 12/04/2019   TRIG 131 12/04/2019   Lab Results  Component Value Date   WBC 8.2 09/20/2019  HGB 13.9 09/20/2019   HCT 42.3 09/20/2019   MCV 90 09/20/2019   PLT 327 09/20/2019   Lab Results  Component Value Date   IRON 70 09/20/2019   TIBC 359 09/20/2019   FERRITIN 82 09/20/2019   Attestation Statements:   This is the patient's first visit at [facility]. The patient's NEW PATIENT PACKET was reviewed at length. Included in the packet: current and past health history, medications, allergies, ROS, gynecologic history (women only), surgical history, family history, social history, weight history, weight loss surgery history (for those that have had weight loss surgery), nutritional evaluation, mood and food questionnaire, PHQ9, Epworth questionnaire, sleep habits questionnaire, patient life and health improvement goals questionnaire. These will all be scanned into the patient's chart under media.   During the visit, I independently reviewed the patient's EKG, bioimpedance scale results, and indirect calorimeter results. I used this information to tailor a meal plan for the patient that will help her to lose weight and will improve her obesity-related conditions going forward. I performed a medically necessary appropriate examination  and/or evaluation. I discussed the assessment and treatment plan with the patient. The patient was provided an opportunity to ask questions and all were answered. The patient agreed with the plan and demonstrated an understanding of the instructions. Labs were ordered at this visit and will be reviewed at the next visit unless more critical results need to be addressed immediately. Clinical information was updated and documented in the EMR.   Time spent on visit including pre-visit chart review and post-visit care was 45 minutes.   A separate 15 minutes was spent on risk counseling (see above).     I, Trixie Dredge, am acting as transcriptionist for Coralie Common, MD.  I have reviewed the above documentation for accuracy and completeness, and I agree with the above. - Jinny Blossom, MD

## 2019-12-16 ENCOUNTER — Other Ambulatory Visit (HOSPITAL_COMMUNITY): Payer: BC Managed Care – PPO

## 2019-12-18 ENCOUNTER — Other Ambulatory Visit: Payer: Self-pay

## 2019-12-18 ENCOUNTER — Ambulatory Visit (INDEPENDENT_AMBULATORY_CARE_PROVIDER_SITE_OTHER): Payer: BC Managed Care – PPO | Admitting: Family Medicine

## 2019-12-18 ENCOUNTER — Encounter (INDEPENDENT_AMBULATORY_CARE_PROVIDER_SITE_OTHER): Payer: Self-pay | Admitting: Family Medicine

## 2019-12-18 ENCOUNTER — Ambulatory Visit (INDEPENDENT_AMBULATORY_CARE_PROVIDER_SITE_OTHER): Payer: BC Managed Care – PPO | Admitting: Psychology

## 2019-12-18 VITALS — BP 154/82 | HR 85 | Temp 98.4°F | Ht 65.0 in | Wt 240.0 lb

## 2019-12-18 DIAGNOSIS — F411 Generalized anxiety disorder: Secondary | ICD-10-CM

## 2019-12-18 DIAGNOSIS — E7849 Other hyperlipidemia: Secondary | ICD-10-CM

## 2019-12-18 DIAGNOSIS — I10 Essential (primary) hypertension: Secondary | ICD-10-CM

## 2019-12-18 DIAGNOSIS — Z6841 Body Mass Index (BMI) 40.0 and over, adult: Secondary | ICD-10-CM

## 2019-12-18 DIAGNOSIS — R7401 Elevation of levels of liver transaminase levels: Secondary | ICD-10-CM

## 2019-12-18 DIAGNOSIS — Z9189 Other specified personal risk factors, not elsewhere classified: Secondary | ICD-10-CM | POA: Diagnosis not present

## 2019-12-18 DIAGNOSIS — E559 Vitamin D deficiency, unspecified: Secondary | ICD-10-CM

## 2019-12-18 MED ORDER — SERTRALINE HCL 25 MG PO TABS: 25.0000 mg | ORAL_TABLET | Freq: Every day | ORAL | 0 refills | Status: DC

## 2019-12-18 MED ORDER — VITAMIN D (ERGOCALCIFEROL) 1.25 MG (50000 UNIT) PO CAPS: 50000.0000 [IU] | ORAL_CAPSULE | ORAL | 0 refills | Status: DC

## 2019-12-19 NOTE — Progress Notes (Signed)
Chief Complaint:   OBESITY Marlina is here to discuss her progress with her obesity treatment plan along with follow-up of her obesity related diagnoses. Yoltzin is on the Category 3 Plan with 8 oz of meat at dinner and states she is following her eating plan approximately 65% of the time. Malajah states she is activity while doing yard work.  Today's visit was #: 2 Starting weight: 239 lbs Starting date: 12/04/2019 Today's weight: 240 lbs Today's date: 12/18/2019 Total lbs lost to date: 0 Total lbs lost since last in-office visit: 0  Interim History: Today is Rayssa's first follow up. She weighed herself the first 7-8 days, and she didn't lose weight then she went out to [employer] and felt it was a downward spiral. She is struggling with anxiety. She liked that she didn't have so many choices and felt  That there was more than enough food.  Subjective:   1. GAD (generalized anxiety disorder) Ronnita was previously on Pristiq (significant weight gain) and Prozac (diarrhea and minimal control).   2. Transaminitis Shaida's last AST was 25, ALT 47, and alk phos 130. Her abdominal ultrasound was showing elevated echogenicity in March 2021. I discussed labs with the patient today.  3. Other hyperlipidemia Jaylianna's last LDL was 134, HDL 50, and triglycerides 131. She is on statin and denies myalgias. Her 10 year ASCVD risk score is 2.8%. I discussed labs with the patient today.  4. Vitamin D deficiency Minervia is on Vit D 1,000 IU daily. She notes fatigue, but denies nausea, vomiting, or muscle weakness.  5. Essential hypertension Malajah's blood pressure is elevated today but previously controlled. She denies chest pain, chest pressure, or headaches. She is on amlodipine.  6. At risk for diabetes mellitus Dayra is at higher than average risk for developing diabetes due to her obesity.   Assessment/Plan:   1. GAD (generalized anxiety disorder) Maree agreed to start Zoloft 25 mg PO  daily with no refills, and she will follow up as directed.  - sertraline (ZOLOFT) 25 MG tablet; Take 1 tablet (25 mg total) by mouth daily.  Dispense: 30 tablet; Refill: 0  2. Transaminitis We will repeat CMP in 3 months, and Lourine will follow up as directed.   3. Other hyperlipidemia Cardiovascular risk and specific lipid/LDL goals reviewed. We discussed several lifestyle modifications today. Lifestyle changes encouraged. Emmett will continue her Category 3 meal plan, and continue to work on to work on exercise and weight loss efforts. Orders and follow up as documented in patient record.   Counseling Intensive lifestyle modifications are the first line treatment for this issue.  Dietary changes: Increase soluble fiber. Decrease simple carbohydrates.  Exercise changes: Moderate to vigorous-intensity aerobic activity 150 minutes per week if tolerated.  Lipid-lowering medications: see documented in medical record.  4. Vitamin D deficiency Low Vitamin D level contributes to fatigue and are associated with obesity, breast, and colon cancer. Ben agreed to start prescription Vitamin D 50,000 IU every week with no refills. She will follow-up for routine testing of Vitamin D, at least 2-3 times per year to avoid over-replacement.  - Vitamin D, Ergocalciferol, (DRISDOL) 1.25 MG (50000 UNIT) CAPS capsule; Take 1 capsule (50,000 Units total) by mouth every 7 (seven) days.  Dispense: 4 capsule; Refill: 0  5. Essential hypertension Danne is working on healthy weight loss and exercise to improve blood pressure control. We will watch for signs of hypotension as she continues her lifestyle modifications. We will follow up  on her blood pressure at her next appointment, if blood pressure is elevated then we will need to increase amlodipine.  6. At risk for diabetes mellitus Tranesha was given approximately 30 minutes of diabetes education and counseling today. We discussed intensive lifestyle  modifications today with an emphasis on weight loss as well as increasing exercise and decreasing simple carbohydrates in her diet. We also reviewed medication options with an emphasis on risk versus benefit of those discussed.   Repetitive spaced learning was employed today to elicit superior memory formation and behavioral change.  7. Class 3 severe obesity with serious comorbidity and body mass index (BMI) of 40.0 to 44.9 in adult, unspecified obesity type [facility]) Blessings is currently in the action stage of change. As such, her goal is to continue with weight loss efforts. She has agreed to the Category 3 Plan with 8 oz of meat at dinner.   Exercise goals: All adults should avoid inactivity. Some physical activity is better than none, and adults who participate in any amount of physical activity gain some health benefits.  Behavioral modification strategies: increasing lean protein intake, meal planning and cooking strategies, keeping healthy foods in the home and planning for success.  Ayaan has agreed to follow-up with our clinic in 2 weeks. She was informed of the importance of frequent follow-up visits to maximize her success with intensive lifestyle modifications for her multiple health conditions.   Objective:   Blood pressure (!) 154/82, pulse 85, temperature 98.4 F (36.9 C), temperature source Oral, height '5\' 5"'  (1.651 m), weight 240 lb (108.9 kg), SpO2 96 %. Body mass index is 39.94 kg/m.  General: Cooperative, alert, well developed, in no acute distress. HEENT: Conjunctivae and lids unremarkable. Cardiovascular: Regular rhythm.  Lungs: Normal work of breathing. Neurologic: No focal deficits.   Lab Results  Component Value Date   CREATININE 0.58 12/04/2019   BUN 10 12/04/2019   NA 140 12/04/2019   K 4.2 12/04/2019   CL 103 12/04/2019   CO2 22 12/04/2019   Lab Results  Component Value Date   ALT 47 (H) 12/04/2019   AST 25 12/04/2019   ALKPHOS 130 (H) 12/04/2019    BILITOT 0.8 12/04/2019   Lab Results  Component Value Date   HGBA1C 5.5 09/20/2019   HGBA1C 5.4 06/24/2019   HGBA1C 5.4 04/27/2017   Lab Results  Component Value Date   INSULIN 26.1 (H) 12/04/2019   Lab Results  Component Value Date   TSH 2.760 09/20/2019   Lab Results  Component Value Date   CHOL 207 (H) 12/04/2019   HDL 50 12/04/2019   LDLCALC 134 (H) 12/04/2019   TRIG 131 12/04/2019   Lab Results  Component Value Date   WBC 8.2 09/20/2019   HGB 13.9 09/20/2019   HCT 42.3 09/20/2019   MCV 90 09/20/2019   PLT 327 09/20/2019   Lab Results  Component Value Date   IRON 70 09/20/2019   TIBC 359 09/20/2019   FERRITIN 82 09/20/2019   Attestation Statements:   Reviewed by clinician on day of visit: allergies, medications, problem list, medical history, surgical history, family history, social history, and previous encounter notes.   I, Trixie Dredge, am acting as transcriptionist for Coralie Common, MD.  I have reviewed the above documentation for accuracy and completeness, and I agree with the above. - Jinny Blossom, MD

## 2019-12-24 DIAGNOSIS — M9903 Segmental and somatic dysfunction of lumbar region: Secondary | ICD-10-CM | POA: Diagnosis not present

## 2019-12-24 DIAGNOSIS — M9901 Segmental and somatic dysfunction of cervical region: Secondary | ICD-10-CM | POA: Diagnosis not present

## 2019-12-24 DIAGNOSIS — R519 Headache, unspecified: Secondary | ICD-10-CM | POA: Diagnosis not present

## 2019-12-24 DIAGNOSIS — M9902 Segmental and somatic dysfunction of thoracic region: Secondary | ICD-10-CM | POA: Diagnosis not present

## 2019-12-26 ENCOUNTER — Ambulatory Visit (INDEPENDENT_AMBULATORY_CARE_PROVIDER_SITE_OTHER): Payer: BC Managed Care – PPO | Admitting: Psychology

## 2019-12-26 DIAGNOSIS — F411 Generalized anxiety disorder: Secondary | ICD-10-CM | POA: Diagnosis not present

## 2019-12-26 DIAGNOSIS — N6012 Diffuse cystic mastopathy of left breast: Secondary | ICD-10-CM | POA: Diagnosis not present

## 2019-12-26 DIAGNOSIS — R922 Inconclusive mammogram: Secondary | ICD-10-CM | POA: Diagnosis not present

## 2019-12-26 DIAGNOSIS — R928 Other abnormal and inconclusive findings on diagnostic imaging of breast: Secondary | ICD-10-CM | POA: Diagnosis not present

## 2019-12-31 ENCOUNTER — Ambulatory Visit (INDEPENDENT_AMBULATORY_CARE_PROVIDER_SITE_OTHER): Payer: BC Managed Care – PPO | Admitting: Psychology

## 2019-12-31 DIAGNOSIS — F411 Generalized anxiety disorder: Secondary | ICD-10-CM

## 2019-12-31 DIAGNOSIS — M9902 Segmental and somatic dysfunction of thoracic region: Secondary | ICD-10-CM | POA: Diagnosis not present

## 2019-12-31 DIAGNOSIS — G43719 Chronic migraine without aura, intractable, without status migrainosus: Secondary | ICD-10-CM | POA: Diagnosis not present

## 2019-12-31 DIAGNOSIS — M9901 Segmental and somatic dysfunction of cervical region: Secondary | ICD-10-CM | POA: Diagnosis not present

## 2019-12-31 DIAGNOSIS — M9903 Segmental and somatic dysfunction of lumbar region: Secondary | ICD-10-CM | POA: Diagnosis not present

## 2019-12-31 DIAGNOSIS — R519 Headache, unspecified: Secondary | ICD-10-CM | POA: Diagnosis not present

## 2020-01-01 ENCOUNTER — Encounter (INDEPENDENT_AMBULATORY_CARE_PROVIDER_SITE_OTHER): Payer: Self-pay | Admitting: Family Medicine

## 2020-01-01 ENCOUNTER — Other Ambulatory Visit: Payer: Self-pay

## 2020-01-01 ENCOUNTER — Ambulatory Visit (INDEPENDENT_AMBULATORY_CARE_PROVIDER_SITE_OTHER): Payer: BC Managed Care – PPO | Admitting: Family Medicine

## 2020-01-01 VITALS — BP 126/79 | HR 82 | Temp 97.5°F | Ht 65.0 in | Wt 237.0 lb

## 2020-01-01 DIAGNOSIS — F411 Generalized anxiety disorder: Secondary | ICD-10-CM | POA: Diagnosis not present

## 2020-01-01 DIAGNOSIS — E8881 Metabolic syndrome: Secondary | ICD-10-CM | POA: Diagnosis not present

## 2020-01-01 DIAGNOSIS — Z6839 Body mass index (BMI) 39.0-39.9, adult: Secondary | ICD-10-CM

## 2020-01-01 DIAGNOSIS — Z9189 Other specified personal risk factors, not elsewhere classified: Secondary | ICD-10-CM | POA: Diagnosis not present

## 2020-01-01 MED ORDER — SERTRALINE HCL 25 MG PO TABS: 25.0000 mg | ORAL_TABLET | Freq: Every day | ORAL | 0 refills | Status: DC

## 2020-01-01 NOTE — Progress Notes (Signed)
Chief Complaint:   OBESITY [name] is here to discuss her progress with her obesity treatment plan along with follow-up of her obesity related diagnoses. [name] is on the Category 3 Plan with 8 oz of meat at dinner and states she is following her eating plan approximately 80% of the time. [name] states she is swimming and being more active for 45-60 minutes 5 times per week.  Today's visit was #: 3 Starting weight: 239 lbs Starting date: 12/04/2019 Today's weight: 237 lbs Today's date: 01/01/2020 Total lbs lost to date: 2 Total lbs lost since last in-office visit: 3  Interim History: [name] is concerned about her qualification for total knee replacement, as she was told her weight needed to be 225 lbs. She is feeling better mood wise. She denies obstacles in the next few weeks. She has been able to follow the plan easier with more structure.  Subjective:   1. GAD (generalized anxiety disorder) [name] denies suicidal ideas or homicidal ideas. She notes her symptoms are much better controlled with initiation of Zoloft. She is feeling like she can finally take a breath.  2. Insulin resistance [name]'s last A1c was 5.5 and insulin 26.1. She is not on metformin.  3. At risk for diabetes mellitus [name] is at higher than average risk for developing diabetes due to her obesity.   Assessment/Plan:   1. GAD (generalized anxiety disorder) Behavior modification techniques were discussed today to help Emmaleah deal with her anxiety. We will refill Zoloft for 1 month. Orders and follow up as documented in patient record.   - sertraline (ZOLOFT) 25 MG tablet; Take 1 tablet (25 mg total) by mouth daily.  Dispense: 30 tablet; Refill: 0  2. Insulin resistance Gursimran will continue to work on weight loss, exercise, and decreasing simple carbohydrates to help decrease the risk of diabetes. We will repeat labs in 3 months. [name] agreed to follow-up with [name] as directed to closely monitor her  progress.  3. At risk for diabetes mellitus [name] was given approximately 15 minutes of diabetes education and counseling today. We discussed intensive lifestyle modifications today with an emphasis on weight loss as well as increasing exercise and decreasing simple carbohydrates in her diet. We also reviewed medication options with an emphasis on risk versus benefit of those discussed.   Repetitive spaced learning was employed today to elicit superior memory formation and behavioral change.  4. Class 2 severe obesity with serious comorbidity and body mass index (BMI) of 39.0 to 39.9 in adult, unspecified obesity type [facility]) [name] is currently in the action stage of change. As such, her goal is to continue with weight loss efforts. She has agreed to the Category 3 Plan with 8 oz of meat at dinner.   Exercise goals: All adults should avoid inactivity. Some physical activity is better than none, and adults who participate in any amount of physical activity gain some health benefits.  Behavioral modification strategies: increasing lean protein intake, meal planning and cooking strategies, keeping healthy foods in the home and planning for success.  [name] has agreed to follow-up with our clinic in 2 weeks. She was informed of the importance of frequent follow-up visits to maximize her success with intensive lifestyle modifications for her multiple health conditions.   Objective:   Blood pressure 126/79, pulse 82, temperature (!) 97.5 F (36.4 C), temperature source Oral, height 5\' 5"  (1.651 m), weight 237 lb (107.5 kg), SpO2 97 %. Body mass index is 39.44 kg/m.  General: Cooperative,  alert, well developed, in no acute distress. HEENT: Conjunctivae and lids unremarkable. Cardiovascular: Regular rhythm.  Lungs: Normal work of breathing. Neurologic: No focal deficits.   Lab Results  Component Value Date   CREATININE 0.58 12/04/2019   BUN 10 12/04/2019   NA 140 12/04/2019   K 4.2  12/04/2019   CL 103 12/04/2019   CO2 22 12/04/2019   Lab Results  Component Value Date   ALT 47 (H) 12/04/2019   AST 25 12/04/2019   ALKPHOS 130 (H) 12/04/2019   BILITOT 0.8 12/04/2019   Lab Results  Component Value Date   HGBA1C 5.5 09/20/2019   HGBA1C 5.4 06/24/2019   HGBA1C 5.4 04/27/2017   Lab Results  Component Value Date   INSULIN 26.1 (H) 12/04/2019   Lab Results  Component Value Date   TSH 2.760 09/20/2019   Lab Results  Component Value Date   CHOL 207 (H) 12/04/2019   HDL 50 12/04/2019   LDLCALC 134 (H) 12/04/2019   TRIG 131 12/04/2019   Lab Results  Component Value Date   WBC 8.2 09/20/2019   HGB 13.9 09/20/2019   HCT 42.3 09/20/2019   MCV 90 09/20/2019   PLT 327 09/20/2019   Lab Results  Component Value Date   IRON 70 09/20/2019   TIBC 359 09/20/2019   FERRITIN 82 09/20/2019   Attestation Statements:   Reviewed by clinician on day of visit: allergies, medications, problem list, medical history, surgical history, family history, social history, and previous encounter notes.   I, Trixie Dredge, am acting as transcriptionist for Coralie Common, MD.  I have reviewed the above documentation for accuracy and completeness, and I agree with the above. - Jinny Blossom, MD

## 2020-01-02 ENCOUNTER — Ambulatory Visit (HOSPITAL_COMMUNITY): Payer: BC Managed Care – PPO | Attending: Cardiology

## 2020-01-02 DIAGNOSIS — R0600 Dyspnea, unspecified: Secondary | ICD-10-CM | POA: Diagnosis not present

## 2020-01-02 DIAGNOSIS — Z01810 Encounter for preprocedural cardiovascular examination: Secondary | ICD-10-CM | POA: Diagnosis not present

## 2020-01-02 DIAGNOSIS — R0609 Other forms of dyspnea: Secondary | ICD-10-CM

## 2020-01-02 LAB — ECHOCARDIOGRAM COMPLETE
Area-P 1/2: 3.77 cm2
S' Lateral: 2.9 cm

## 2020-01-07 DIAGNOSIS — M9902 Segmental and somatic dysfunction of thoracic region: Secondary | ICD-10-CM | POA: Diagnosis not present

## 2020-01-07 DIAGNOSIS — R519 Headache, unspecified: Secondary | ICD-10-CM | POA: Diagnosis not present

## 2020-01-07 DIAGNOSIS — M9901 Segmental and somatic dysfunction of cervical region: Secondary | ICD-10-CM | POA: Diagnosis not present

## 2020-01-07 DIAGNOSIS — M9903 Segmental and somatic dysfunction of lumbar region: Secondary | ICD-10-CM | POA: Diagnosis not present

## 2020-01-09 ENCOUNTER — Encounter (INDEPENDENT_AMBULATORY_CARE_PROVIDER_SITE_OTHER): Payer: Self-pay | Admitting: Family Medicine

## 2020-01-09 ENCOUNTER — Ambulatory Visit (INDEPENDENT_AMBULATORY_CARE_PROVIDER_SITE_OTHER): Payer: BC Managed Care – PPO | Admitting: Psychology

## 2020-01-09 DIAGNOSIS — F411 Generalized anxiety disorder: Secondary | ICD-10-CM | POA: Diagnosis not present

## 2020-01-09 DIAGNOSIS — G43719 Chronic migraine without aura, intractable, without status migrainosus: Secondary | ICD-10-CM | POA: Diagnosis not present

## 2020-01-09 DIAGNOSIS — M542 Cervicalgia: Secondary | ICD-10-CM | POA: Diagnosis not present

## 2020-01-09 NOTE — Telephone Encounter (Signed)
Please advise 

## 2020-01-13 ENCOUNTER — Other Ambulatory Visit (INDEPENDENT_AMBULATORY_CARE_PROVIDER_SITE_OTHER): Payer: Self-pay | Admitting: Family Medicine

## 2020-01-13 DIAGNOSIS — E559 Vitamin D deficiency, unspecified: Secondary | ICD-10-CM

## 2020-01-13 NOTE — Telephone Encounter (Signed)
Please advise 

## 2020-01-14 DIAGNOSIS — M9902 Segmental and somatic dysfunction of thoracic region: Secondary | ICD-10-CM | POA: Diagnosis not present

## 2020-01-14 DIAGNOSIS — M9901 Segmental and somatic dysfunction of cervical region: Secondary | ICD-10-CM | POA: Diagnosis not present

## 2020-01-14 DIAGNOSIS — R519 Headache, unspecified: Secondary | ICD-10-CM | POA: Diagnosis not present

## 2020-01-14 DIAGNOSIS — M9903 Segmental and somatic dysfunction of lumbar region: Secondary | ICD-10-CM | POA: Diagnosis not present

## 2020-01-15 ENCOUNTER — Encounter (INDEPENDENT_AMBULATORY_CARE_PROVIDER_SITE_OTHER): Payer: Self-pay | Admitting: Family Medicine

## 2020-01-15 ENCOUNTER — Other Ambulatory Visit (INDEPENDENT_AMBULATORY_CARE_PROVIDER_SITE_OTHER): Payer: Self-pay | Admitting: Family Medicine

## 2020-01-15 ENCOUNTER — Ambulatory Visit (INDEPENDENT_AMBULATORY_CARE_PROVIDER_SITE_OTHER): Payer: BC Managed Care – PPO | Admitting: Family Medicine

## 2020-01-15 ENCOUNTER — Other Ambulatory Visit: Payer: Self-pay

## 2020-01-15 ENCOUNTER — Ambulatory Visit: Payer: BC Managed Care – PPO | Admitting: Psychology

## 2020-01-15 VITALS — BP 135/90 | HR 82 | Temp 98.3°F | Ht 65.0 in | Wt 237.0 lb

## 2020-01-15 DIAGNOSIS — F329 Major depressive disorder, single episode, unspecified: Secondary | ICD-10-CM

## 2020-01-15 DIAGNOSIS — F411 Generalized anxiety disorder: Secondary | ICD-10-CM

## 2020-01-15 DIAGNOSIS — E8881 Metabolic syndrome: Secondary | ICD-10-CM | POA: Diagnosis not present

## 2020-01-15 DIAGNOSIS — F32A Depression, unspecified: Secondary | ICD-10-CM

## 2020-01-15 DIAGNOSIS — Z6839 Body mass index (BMI) 39.0-39.9, adult: Secondary | ICD-10-CM

## 2020-01-15 DIAGNOSIS — Z9189 Other specified personal risk factors, not elsewhere classified: Secondary | ICD-10-CM

## 2020-01-15 DIAGNOSIS — E559 Vitamin D deficiency, unspecified: Secondary | ICD-10-CM | POA: Diagnosis not present

## 2020-01-15 DIAGNOSIS — F419 Anxiety disorder, unspecified: Secondary | ICD-10-CM

## 2020-01-15 DIAGNOSIS — G4733 Obstructive sleep apnea (adult) (pediatric): Secondary | ICD-10-CM | POA: Diagnosis not present

## 2020-01-15 DIAGNOSIS — E88819 Insulin resistance, unspecified: Secondary | ICD-10-CM

## 2020-01-15 DIAGNOSIS — G4710 Hypersomnia, unspecified: Secondary | ICD-10-CM | POA: Diagnosis not present

## 2020-01-15 DIAGNOSIS — E66812 Obesity, class 2: Secondary | ICD-10-CM

## 2020-01-15 HISTORY — PX: TOTAL KNEE ARTHROPLASTY: SHX125

## 2020-01-15 MED ORDER — VITAMIN D (ERGOCALCIFEROL) 1.25 MG (50000 UNIT) PO CAPS: 50000.0000 [IU] | ORAL_CAPSULE | ORAL | 0 refills | Status: DC

## 2020-01-15 MED ORDER — SERTRALINE HCL 50 MG PO TABS: 50.0000 mg | ORAL_TABLET | Freq: Every day | ORAL | 0 refills | Status: DC

## 2020-01-15 MED ORDER — VICTOZA 18 MG/3ML ~~LOC~~ SOPN: 0.6000 mg | PEN_INJECTOR | Freq: Every day | SUBCUTANEOUS | 0 refills | Status: DC

## 2020-01-15 MED ORDER — WEGOVY 0.25 MG/0.5ML ~~LOC~~ SOAJ: 0.2500 mg | SUBCUTANEOUS | 0 refills | Status: DC

## 2020-01-16 ENCOUNTER — Ambulatory Visit (INDEPENDENT_AMBULATORY_CARE_PROVIDER_SITE_OTHER): Payer: BC Managed Care – PPO | Admitting: Psychology

## 2020-01-16 DIAGNOSIS — F411 Generalized anxiety disorder: Secondary | ICD-10-CM | POA: Diagnosis not present

## 2020-01-16 NOTE — Progress Notes (Signed)
Chief Complaint:   OBESITY [name] is here to discuss her progress with her obesity treatment plan along with follow-up of her obesity related diagnoses. [name] is on the Category 3 Plan with 8 oz of meat at dinner and states she is following her eating plan approximately 85% of the time. [name] states she is doing 0 minutes 0 times per week.  Today's visit was #: 4 Starting weight: 239 lbs Starting date: 12/04/2019 Today's weight: 237 lbs Today's date: 01/15/2020 Total lbs lost to date: 2 Total lbs lost since last in-office visit: 0  Interim History: [name] saw her Orthopedist yesterday and was approved for surgery. She voices she is trying hard to stay on track and following meal plan more frequently than not. She is feeling that she is losing inches but not necessarily translating into weight loss.  Subjective:   1. Insulin resistance [name]'s last A1c was 5.5 and insulin 26.1. She can't take medication of metformin secondary to significant GI side effects of diarrhea and then fistula formation.  2. Vitamin D deficiency [name] denies nausea, vomiting, or muscle weakness, but she notes fatigue. She is on prescription Vit D, and last Vit D level was 28.5.  3. Anxiety and depression [name] denies suicidal ideas or homicidal ideas. She is on sertraline 50 mg, and her symptoms are better controlled with increased Zoloft dose.  4. At risk for diabetes mellitus [name] is at higher than average risk for developing diabetes due to her obesity.   Assessment/Plan:   1. Insulin resistance Oletta will continue to work on weight loss, exercise, and decreasing simple carbohydrates to help decrease the risk of diabetes. [name] agreed to follow-up with [place] as directed to closely monitor her progress.  2. Vitamin D deficiency Low Vitamin D level contributes to fatigue and are associated with obesity, breast, and colon cancer. We will refill prescription Vitamin D for 1 month. [name] will  follow-up for routine testing of Vitamin D, at least 2-3 times per year to avoid over-replacement.  - Vitamin D, Ergocalciferol, (DRISDOL) 1.25 MG (50000 UNIT) CAPS capsule; Take 1 capsule (50,000 Units total) by mouth every 7 (seven) days.  Dispense: 4 capsule; Refill: 0  3. Anxiety and depression Behavior modification techniques were discussed today to help [name] deal with her anxiety and depression. We will refill Zoloft for 1 month. Orders and follow up as documented in patient record.   - sertraline (ZOLOFT) 50 MG tablet; Take 1 tablet (50 mg total) by mouth daily.  Dispense: 30 tablet; Refill: 0  4. At risk for diabetes mellitus [name] was given approximately 15 minutes of diabetes education and counseling today. We discussed intensive lifestyle modifications today with an emphasis on weight loss as well as increasing exercise and decreasing simple carbohydrates in her diet. We also reviewed medication options with an emphasis on risk versus benefit of those discussed.   Repetitive spaced learning was employed today to elicit superior memory formation and behavioral change.  5. Class 2 severe obesity with serious comorbidity and body mass index (BMI) of 39.0 to 39.9 in adult, unspecified obesity type [name]) [name] is currently in the action stage of change. As such, her goal is to continue with weight loss efforts. She has agreed to the Category 3 Plan with 8 oz of meat at dinner.   We discussed various medication options to help [name] with her weight loss efforts and we both agreed to start Wegovy 0.25 mg SubQ weekly with no refills.  -  Semaglutide-Weight Management (WEGOVY) 0.25 MG/0.5ML SOAJ; Inject 0.5 mLs (0.25 mg total) into the skin once a week.  Dispense: 2 mL; Refill: 0  Exercise goals: No exercise has been prescribed at this time.  Behavioral modification strategies: increasing lean protein intake, meal planning and cooking strategies, keeping healthy foods in the home and  planning for success.  [name] has agreed to follow-up with our clinic in 2 weeks. She was informed of the importance of frequent follow-up visits to maximize her success with intensive lifestyle modifications for her multiple health conditions.   Objective:   Blood pressure 135/90, pulse 82, temperature 98.3 F (36.8 C), temperature source Oral, height 5\' 5"  (1.651 m), weight 237 lb (107.5 kg), SpO2 96 %. Body mass index is 39.44 kg/m.  General: Cooperative, alert, well developed, in no acute distress. HEENT: Conjunctivae and lids unremarkable. Cardiovascular: Regular rhythm.  Lungs: Normal work of breathing. Neurologic: No focal deficits.   Lab Results  Component Value Date   CREATININE 0.58 12/04/2019   BUN 10 12/04/2019   NA 140 12/04/2019   K 4.2 12/04/2019   CL 103 12/04/2019   CO2 22 12/04/2019   Lab Results  Component Value Date   ALT 47 (H) 12/04/2019   AST 25 12/04/2019   ALKPHOS 130 (H) 12/04/2019   BILITOT 0.8 12/04/2019   Lab Results  Component Value Date   HGBA1C 5.5 09/20/2019   HGBA1C 5.4 06/24/2019   HGBA1C 5.4 04/27/2017   Lab Results  Component Value Date   INSULIN 26.1 (H) 12/04/2019   Lab Results  Component Value Date   TSH 2.760 09/20/2019   Lab Results  Component Value Date   CHOL 207 (H) 12/04/2019   HDL 50 12/04/2019   LDLCALC 134 (H) 12/04/2019   TRIG 131 12/04/2019   Lab Results  Component Value Date   WBC 8.2 09/20/2019   HGB 13.9 09/20/2019   HCT 42.3 09/20/2019   MCV 90 09/20/2019   PLT 327 09/20/2019   Lab Results  Component Value Date   IRON 70 09/20/2019   TIBC 359 09/20/2019   FERRITIN 82 09/20/2019   Attestation Statements:   Reviewed by clinician on day of visit: allergies, medications, problem list, medical history, surgical history, family history, social history, and previous encounter notes.   I, Trixie Dredge, am acting as transcriptionist for Coralie Common, MD.  I have reviewed the above  documentation for accuracy and completeness, and I agree with the above. - Jinny Blossom, MD

## 2020-01-17 ENCOUNTER — Encounter (INDEPENDENT_AMBULATORY_CARE_PROVIDER_SITE_OTHER): Payer: Self-pay | Admitting: Family Medicine

## 2020-01-21 NOTE — Telephone Encounter (Signed)
Please advise 

## 2020-01-23 ENCOUNTER — Other Ambulatory Visit: Payer: Self-pay

## 2020-01-23 ENCOUNTER — Ambulatory Visit: Payer: BC Managed Care – PPO | Admitting: Psychology

## 2020-01-23 ENCOUNTER — Encounter (HOSPITAL_COMMUNITY): Payer: BC Managed Care – PPO

## 2020-01-26 ENCOUNTER — Other Ambulatory Visit: Payer: Self-pay

## 2020-01-26 ENCOUNTER — Encounter (INDEPENDENT_AMBULATORY_CARE_PROVIDER_SITE_OTHER): Payer: Self-pay | Admitting: Family Medicine

## 2020-01-26 DIAGNOSIS — R7401 Elevation of levels of liver transaminase levels: Secondary | ICD-10-CM

## 2020-01-26 NOTE — Progress Notes (Signed)
Pt due for LFTs °

## 2020-01-27 ENCOUNTER — Ambulatory Visit (INDEPENDENT_AMBULATORY_CARE_PROVIDER_SITE_OTHER): Payer: BC Managed Care – PPO | Admitting: Internal Medicine

## 2020-01-27 ENCOUNTER — Other Ambulatory Visit: Payer: Self-pay

## 2020-01-27 ENCOUNTER — Telehealth: Payer: Self-pay

## 2020-01-27 ENCOUNTER — Encounter: Payer: Self-pay | Admitting: Internal Medicine

## 2020-01-27 VITALS — BP 124/86 | HR 80 | Ht 65.0 in | Wt 241.6 lb

## 2020-01-27 DIAGNOSIS — I10 Essential (primary) hypertension: Secondary | ICD-10-CM

## 2020-01-27 DIAGNOSIS — R7303 Prediabetes: Secondary | ICD-10-CM

## 2020-01-27 DIAGNOSIS — Z01810 Encounter for preprocedural cardiovascular examination: Secondary | ICD-10-CM

## 2020-01-27 DIAGNOSIS — R0600 Dyspnea, unspecified: Secondary | ICD-10-CM | POA: Diagnosis not present

## 2020-01-27 DIAGNOSIS — Z6841 Body Mass Index (BMI) 40.0 and over, adult: Secondary | ICD-10-CM

## 2020-01-27 DIAGNOSIS — E7800 Pure hypercholesterolemia, unspecified: Secondary | ICD-10-CM

## 2020-01-27 DIAGNOSIS — R0609 Other forms of dyspnea: Secondary | ICD-10-CM

## 2020-01-27 NOTE — Telephone Encounter (Signed)
Called and spoke to pt. She will go to the lab one day this week. Order for LFTs is in.

## 2020-01-27 NOTE — Telephone Encounter (Signed)
-----   Message from Roetta Sessions, [place] sent at 08/01/2019  8:17 AM EDT ----- Regarding: labs due in Sept LFTs  due in Sept 2020 for fatty liver, elevated liver enzymes

## 2020-01-27 NOTE — Progress Notes (Signed)
Cardiology Office Note:    Date:  01/27/2020   ID:  [name], DOB 08/12/1970, MRN [id number]  PCP:  Girtha Rm, NP-C  Cardiologist:  Elouise Munroe, MD  Electrophysiologist:  None   Referring MD: Girtha Rm, NP-C   Chief Complaint/Reason for Referral: Preoperative evaluation  History of Present Illness:    [name] is a 49 y.o. female with a history of hypertension, obstructive sleep apnea on CPAP, prediabetes.  She presents for reevaluation for right knee arthroplasty scheduled on February 05, 2020.  We obtain an echocardiogram due to dyspnea on exertion with climbing stairs.  Her echocardiogram was unremarkable.  She is able to easily complete greater than 4 METs and swam frequently over the summer without symptoms.  Her right knee is bothering her and it has made it more difficult for her to exercise.  Her shortness of breath is stable and has not worsened.  This was minimal and only with exertion.  She feels stronger and her clothes fit better, but she has not noticed a significant change on the scale.  We discussed that building muscle mass with exercise is an important component of health, and will assist with weight loss.  She follows closely with [facility].  The patient denies chest pain, chest pressure, dyspnea at rest,  palpitations, PND, orthopnea, or leg swelling. Denies cough, fever, chills. Denies nausea, vomiting. Denies syncope or presyncope. Denies dizziness or lightheadedness.  Past Medical History:  Diagnosis Date  . 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 cervical ca  . ADD (attention deficit disorder)   . Anal fistula   . Anxiety   . B12 deficiency   . Back pain   . Bilateral swelling of feet   . Cervical cancer [facility]) current oncologist -- dr Durwin Reges ([facility])/  previously dr Rayford Halsted 2 [facility]   dx 12/ 2014 invasive cervical cancer--- Stage IB2---  06-11-2013  s/p  TAH w/ BSO and pelvic node dissection,  completed chemoradiation 04/ 2015  . Endometriosis   . Fatigue   . Fatty liver   . Fibroid   . Hemorrhoids   . History of cancer chemotherapy    FOR CERVICAL CANCER , COMPLETED 08-2013  . History of radiation therapy    FOR CERVICAL CANCER , COMPLETED 08-2013  . HTN (hypertension)   . Hypercholesteremia   . Hypertension   . IBS (irritable bowel syndrome)   . Joint pain   . Lactose intolerance   . Migraines   . Multiple food allergies   . Nodule of upper lobe of left lung    last CT chest 05-15-2017 (care everywhere in epic)   . OSA on CPAP   . Prediabetes   . SOB (shortness of breath)   . 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 Urinary incontinence   . Vitamin D deficiency     Past Surgical History:  Procedure Laterality Date  . ABDOMINAL HYSTERECTOMY  06/11/2013   TAH/BSO  . BREAST SURGERY Right 11/25/2008   2 benign lumps  . COLONOSCOPY    . COLPOSCOPY    . EVALUATION UNDER ANESTHESIA WITH ANAL FISTULECTOMY N/A 07/06/2017   Procedure: ANAL EXAM UNDER ANESTHESIA;  Surgeon: Leighton Ruff, MD;  Location: [place];  Service: General;  Laterality: N/A;  . HERNIA REPAIR     at hysterectomy surgical site  double  .  INCISION AND DRAINAGE PERIRECTAL ABSCESS N/A 04/26/2017   Procedure: IRRIGATION AND DEBRIDEMENT PERIRECTAL ABSCESS;  Surgeon: Johnathan Hausen, MD;  Location: [facility];  Service: General;  Laterality: N/A;  . LAPAROSCOPY INCISIONAL HERNIA REPAIR W/ MESH, LYSIS ADHESIONS  01-10-2014   @[facility]   . LIGATION OF INTERNAL FISTULA TRACT N/A 03/08/2018   Procedure: LIGATION OF INTERNAL FISTULA TRACT;  Surgeon: Leighton Ruff, MD;  Location: [place];  Service: General;  Laterality: N/A;  . [place] N/A 07/06/2017   Procedure: PLACEMENT OF SETON;  Surgeon: Leighton Ruff, MD;  Location[facility];  Service: General;  Laterality: N/A;  . TOTAL  ABDOMINAL HYSTERECTOMY W/ BILATERAL SALPINGOOPHORECTOMY  06-11-2013    dr Rayford Halsted @[facility]    W/ PELVIC [place]  . TOTAL KNEE ARTHROPLASTY Left 04/29/2019   Procedure: TOTAL KNEE ARTHROPLASTY;  Surgeon: Gaynelle Arabian, MD;  Location: [facility];  Service: Orthopedics;  Laterality: Left;  78min  . WISDOM TOOTH EXTRACTION      Current Medications: Current Meds  Medication Sig  . acetaminophen (TYLENOL) 500 MG tablet Take 500 mg by mouth every 6 (six) hours as needed.  Marland Kitchen amLODipine (NORVASC) 5 MG tablet TAKE 1 TABLET(5 MG) BY MOUTH DAILY  . atorvastatin (LIPITOR) 10 MG tablet TAKE 1 TABLET(10 MG) BY MOUTH DAILY AT 6 PM  . AZO-CRANBERRY PO Take 2 tablets by mouth daily.  . chlorproMAZINE (THORAZINE) 25 MG tablet Take 25 mg by mouth daily as needed (migraine headaches).   . diclofenac (VOLTAREN) 75 MG EC tablet Take 75 mg by mouth 2 (two) times daily.  . Loperamide HCl (IMODIUM A-D PO) Take by mouth.  . Multiple Vitamin (MULTIVITAMIN WITH MINERALS) TABS tablet Take 1 tablet by mouth daily.  . Omega-3 Fatty Acids (FISH OIL) 1000 MG CAPS Take 1,000 tablets by mouth daily.   Vladimir Faster Glycol-Propyl Glycol (SYSTANE OP) Apply 1 drop to eye daily as needed (dry/irritated eyes).  . rizatriptan (MAXALT) 10 MG tablet Take 10 mg by mouth as needed.  . Semaglutide-Weight Management (WEGOVY) 0.25 MG/0.5ML SOAJ Inject 0.5 mLs (0.25 mg total) into the skin once a week.  . sertraline (ZOLOFT) 50 MG tablet Take 1 tablet (50 mg total) by mouth daily.  . Vitamin D, Ergocalciferol, (DRISDOL) 1.25 MG (50000 UNIT) CAPS capsule Take 1 capsule (50,000 Units total) by mouth every 7 (seven) days.     Allergies:   Amoxicillin, Penicillins, Sulfa antibiotics, and Other   Social History   Tobacco Use  . Smoking status: Former Smoker    Quit date: 04/26/1998    Years since quitting: 21.7  . Smokeless tobacco: Never Used  Vaping Use  . Vaping Use: Never used  Substance Use Topics  . Alcohol use:  Not Currently    Alcohol/week: 0.0 standard drinks    Comment: social  . Drug use: No     Family History: The patient's family history includes Bladder Cancer (age of onset: 56) in her father; Cancer in her father; Colon cancer in her paternal uncle; Diabetes in her father and maternal grandfather; Heart disease in her maternal grandfather; Hyperlipidemia in her father and mother; Hypertension in her father and mother; Irritable bowel syndrome in her father; Obesity in her father; Stroke in her maternal grandfather.  ROS:   Please see the history of present illness.    All other systems reviewed and are negative.  EKGs/Labs/Other Studies Reviewed:    The following studies were reviewed today:  EKG:  NSR  Recent Labs: 09/20/2019: Hemoglobin 13.9; Platelets 327; TSH 2.760 12/04/2019: ALT 47; BUN 10; Creatinine, Ser 0.58; Potassium 4.2; Sodium 140  Recent Lipid Panel    Component Value Date/Time   CHOL 207 (H) 12/04/2019 1327   TRIG 131 12/04/2019 1327   HDL 50 12/04/2019 1327   LDLCALC 134 (H) 12/04/2019 1327    Physical Exam:    VS:  BP 124/86   Pulse 80   Ht 5\' 5"  (1.651 m)   Wt 241 lb 9.6 oz (109.6 kg)   SpO2 97%   BMI 40.20 kg/m     Wt Readings from Last 5 Encounters:  01/27/20 241 lb 9.6 oz (109.6 kg)  01/15/20 237 lb (107.5 kg)  01/01/20 237 lb (107.5 kg)  12/18/19 240 lb (108.9 kg)  12/04/19 239 lb (108.4 kg)    Constitutional: No acute distress Eyes: sclera non-icteric, normal conjunctiva and lids ENMT: normal dentition, moist mucous membranes Cardiovascular: regular rhythm, normal rate, no murmurs. S1 and S2 normal. Radial pulses normal bilaterally. No jugular venous distention.  Respiratory: clear to auscultation bilaterally GI : normal bowel sounds, soft and nontender. No distention.   MSK: extremities warm, well perfused. No edema.  NEURO: grossly nonfocal exam, moves all extremities. PSYCH: alert and oriented x 3, normal mood and affect.   ASSESSMENT:     1. Preoperative cardiovascular examination   2. DOE (dyspnea on exertion)   3. Class 3 severe obesity with body mass index (BMI) of 40.0 to 44.9 in adult, unspecified obesity type, unspecified whether serious comorbidity present ([place])   4. Pre-diabetes   5. Essential hypertension   6. Pure hypercholesterolemia    PLAN:    Preoperative cardiovascular examination -   The patient is low risk for intermediate risk procedure.  No further cardiovascular testing is required prior to the procedure.  If this level of risk is acceptable to the patient and surgical team, the patient should be considered optimized from a cardiovascular standpoint.  Patient is aware of her risk stratification and is willing to proceed.  DOE (dyspnea on exertion)-unremarkable echocardiogram, continue to observe.  May improve with weight loss.  Class 3 severe obesity with body mass index (BMI) of 40.0 to 44.9 in adult, unspecified obesity type, unspecified whether serious comorbidity present ([place])  Pre-diabetes-per PCP  Essential hypertension-continue amlodipine 5 mg daily  Hyperlipidemia-continue atorvastatin 10 mg daily.  Total time of encounter: 30 minutes total time of encounter, including 20 minutes spent in face-to-face patient care on the date of this encounter. This time includes coordination of care and counseling regarding above mentioned problem list. Remainder of non-face-to-face time involved reviewing chart documents/testing relevant to the patient encounter and documentation in the medical record. I have independently reviewed documentation from referring provider.   Cherlynn Kaiser, MD [place]  [facility]    Medication Adjustments/Labs and Tests Ordered: Current medicines are reviewed at length with the patient today.  Concerns regarding medicines are outlined above.  Orders Placed This Encounter  Procedures  . EKG 12-Lead   No orders of the defined types were placed in this  encounter.   Patient Instructions  Medication Instructions:  No Changes In Medications at this time.  *If you need a refill on your cardiac medications before your next appointment, please call your pharmacy*  Lab Work: None Ordered At This Time.  If you have labs (blood work) drawn today and your tests are completely normal, you will receive your results only by: Marland Kitchen MyChart Message (if you  have MyChart) OR . A paper copy in the mail If you have any lab test that is abnormal or we need to change your treatment, we will call you to review the results.  Testing/Procedures: None Ordered At This Time.   Follow-Up: At [facility], you and your health needs are our priority.  As part of our continuing mission to provide you with exceptional heart care, we have created designated Provider Care Teams.  These Care Teams include your primary Cardiologist (physician) and Advanced Practice Providers (APPs -  Physician Assistants and Nurse Practitioners) who all work together to provide you with the care you need, when you need it.  Your next appointment:   1 year(s)  The format for your next appointment:   In Person  Provider:   Cherlynn Kaiser, MD  Other Instructions Per Dr. Margaretann Loveless- "Normal EKG- no further testing needed for surgery clearance."

## 2020-01-27 NOTE — Telephone Encounter (Signed)
FYI

## 2020-01-27 NOTE — Patient Instructions (Addendum)
Medication Instructions:  No Changes In Medications at this time.  *If you need a refill on your cardiac medications before your next appointment, please call your pharmacy*  Lab Work: None Ordered At This Time.  If you have labs (blood work) drawn today and your tests are completely normal, you will receive your results only by: Marland Kitchen MyChart Message (if you have MyChart) OR . A paper copy in the mail If you have any lab test that is abnormal or we need to change your treatment, we will call you to review the results.  Testing/Procedures: None Ordered At This Time.   Follow-Up: At [facility], you and your health needs are our priority.  As part of our continuing mission to provide you with exceptional heart care, we have created designated Provider Care Teams.  These Care Teams include your primary Cardiologist (physician) and Advanced Practice Providers (APPs -  Physician Assistants and Nurse Practitioners) who all work together to provide you with the care you need, when you need it.  Your next appointment:   1 year(s)  The format for your next appointment:   In Person  Provider:   Cherlynn Kaiser, MD  Other Instructions Per Dr. Margaretann Moon- "Normal EKG- no further testing needed for surgery clearance."

## 2020-01-28 ENCOUNTER — Ambulatory Visit (INDEPENDENT_AMBULATORY_CARE_PROVIDER_SITE_OTHER): Payer: BC Managed Care – PPO | Admitting: Psychology

## 2020-01-28 DIAGNOSIS — F411 Generalized anxiety disorder: Secondary | ICD-10-CM

## 2020-01-29 ENCOUNTER — Encounter (INDEPENDENT_AMBULATORY_CARE_PROVIDER_SITE_OTHER): Payer: Self-pay | Admitting: Family Medicine

## 2020-01-29 ENCOUNTER — Other Ambulatory Visit: Payer: Self-pay

## 2020-01-29 ENCOUNTER — Telehealth (INDEPENDENT_AMBULATORY_CARE_PROVIDER_SITE_OTHER): Payer: Self-pay

## 2020-01-29 ENCOUNTER — Telehealth (INDEPENDENT_AMBULATORY_CARE_PROVIDER_SITE_OTHER): Payer: BC Managed Care – PPO | Admitting: Family Medicine

## 2020-01-29 VITALS — Ht 65.0 in | Wt 239.0 lb

## 2020-01-29 DIAGNOSIS — F329 Major depressive disorder, single episode, unspecified: Secondary | ICD-10-CM

## 2020-01-29 DIAGNOSIS — F419 Anxiety disorder, unspecified: Secondary | ICD-10-CM

## 2020-01-29 DIAGNOSIS — Z6839 Body mass index (BMI) 39.0-39.9, adult: Secondary | ICD-10-CM

## 2020-01-29 DIAGNOSIS — F32A Depression, unspecified: Secondary | ICD-10-CM

## 2020-01-29 DIAGNOSIS — M9903 Segmental and somatic dysfunction of lumbar region: Secondary | ICD-10-CM | POA: Diagnosis not present

## 2020-01-29 DIAGNOSIS — E559 Vitamin D deficiency, unspecified: Secondary | ICD-10-CM

## 2020-01-29 DIAGNOSIS — M9901 Segmental and somatic dysfunction of cervical region: Secondary | ICD-10-CM | POA: Diagnosis not present

## 2020-01-29 DIAGNOSIS — M9902 Segmental and somatic dysfunction of thoracic region: Secondary | ICD-10-CM | POA: Diagnosis not present

## 2020-01-29 DIAGNOSIS — R519 Headache, unspecified: Secondary | ICD-10-CM | POA: Diagnosis not present

## 2020-01-29 DIAGNOSIS — Z0189 Encounter for other specified special examinations: Secondary | ICD-10-CM | POA: Diagnosis not present

## 2020-01-29 MED ORDER — SERTRALINE HCL 50 MG PO TABS: 50.0000 mg | ORAL_TABLET | Freq: Every day | ORAL | 0 refills | Status: DC

## 2020-01-29 MED ORDER — VITAMIN D (ERGOCALCIFEROL) 1.25 MG (50000 UNIT) PO CAPS: 50000.0000 [IU] | ORAL_CAPSULE | ORAL | 0 refills | Status: DC

## 2020-01-29 NOTE — Telephone Encounter (Signed)
I connected with  [name] on 01/29/20 by a video enabled telemedicine application and verified that I am speaking with the correct person using two identifiers.   I discussed the limitations of evaluation and management by telemedicine. The patient expressed understanding and agreed to proceed.

## 2020-01-30 NOTE — Progress Notes (Signed)
TeleHealth Visit:  Due to the COVID-19 pandemic, this visit was completed with telemedicine (audio/video) technology to reduce patient and provider exposure as well as to preserve personal protective equipment.   [name] has verbally consented to this TeleHealth visit. The patient is located at home, the provider is located at the [facility] office. The participants in this visit include the listed provider and patient. The visit was conducted today via Face time.   Chief Complaint: OBESITY [name] is here to discuss her progress with her obesity treatment plan along with follow-up of her obesity related diagnoses. [name] is on the Category 3 Plan with 8 oz of meat at dinner and states she is following her eating plan approximately 85-90% of the time. [name] states she is walking 3,000 steps 6 times per week.  Today's visit was #: 5 Starting weight: 239 lbs Starting date: 12/04/2019  Interim History: [name] has been doing more cottage cheese and protein than previously. She has been told repeatedly that she looks thinner. She is really anticipating on knee surgery in 1 week. This weekend she is going over the meal plan with her mom who will be caring for her. On days she is really following the meal plan she is not hungry.  Subjective:   1. Vitamin D deficiency [name] denies nausea, vomiting, or muscle weakness, but she notes fatigue. She is on prescription Vit D. Last Vit D level was 28.5.  2. Anxiety and depression [name] is up to 75 mg on Zoloft, and she is doing 25 plus 50 from previous prescription. She has notice improvement in irritability.  Assessment/Plan:   1. Vitamin D deficiency Low Vitamin D level contributes to fatigue and are associated with obesity, breast, and colon cancer. We will refill prescription Vitamin D for 1 month. [name] will follow-up for routine testing of Vitamin D, at least 2-3 times per year to avoid over-replacement.  - Vitamin D,  Ergocalciferol, (DRISDOL) 1.25 MG (50000 UNIT) CAPS capsule; Take 1 capsule (50,000 Units total) by mouth every 7 (seven) days.  Dispense: 4 capsule; Refill: 0  2. Anxiety and depression Behavior modification techniques were discussed today to help [name] deal with her anxiety. [name] agreed to increase Zoloft to 75 mg PO daily with no refills. Orders and follow up as documented in patient record.   - sertraline (ZOLOFT) 50 MG tablet; Take 1 tablet (50 mg total) by mouth daily.  Dispense: 60 tablet; Refill: 0  3. Class 2 severe obesity with serious comorbidity and body mass index (BMI) of 39.0 to 39.9 in adult, unspecified obesity type [facility]) [name] is currently in the action stage of change. As such, her goal is to continue with weight loss efforts. She has agreed to the Category 3 Plan.   Exercise goals: As is.  Behavioral modification strategies: increasing lean protein intake, meal planning and cooking strategies, keeping healthy foods in the home and planning for success.  [name] has agreed to follow-up with our clinic in 3 weeks. She was informed of the importance of frequent follow-up visits to maximize her success with intensive lifestyle modifications for her multiple health conditions.  Objective:   VITALS: Per patient if applicable, see vitals. GENERAL: Alert and in no acute distress. CARDIOPULMONARY: No increased WOB. Speaking in clear sentences.  PSYCH: Pleasant and cooperative. Speech normal rate and rhythm. Affect is appropriate. Insight and judgement are appropriate. Attention is focused, linear, and appropriate.  NEURO: Oriented as arrived to appointment on time with no  prompting.   Lab Results  Component Value Date   CREATININE 0.58 12/04/2019   BUN 10 12/04/2019   NA 140 12/04/2019   K 4.2 12/04/2019   CL 103 12/04/2019   CO2 22 12/04/2019   Lab Results  Component Value Date   ALT 47 (H) 12/04/2019   AST 25 12/04/2019   ALKPHOS 130 (H) 12/04/2019   BILITOT 0.8  12/04/2019   Lab Results  Component Value Date   HGBA1C 5.5 09/20/2019   HGBA1C 5.4 06/24/2019   HGBA1C 5.4 04/27/2017   Lab Results  Component Value Date   INSULIN 26.1 (H) 12/04/2019   Lab Results  Component Value Date   TSH 2.760 09/20/2019   Lab Results  Component Value Date   CHOL 207 (H) 12/04/2019   HDL 50 12/04/2019   LDLCALC 134 (H) 12/04/2019   TRIG 131 12/04/2019   Lab Results  Component Value Date   WBC 8.2 09/20/2019   HGB 13.9 09/20/2019   HCT 42.3 09/20/2019   MCV 90 09/20/2019   PLT 327 09/20/2019   Lab Results  Component Value Date   IRON 70 09/20/2019   TIBC 359 09/20/2019   FERRITIN 82 09/20/2019    Attestation Statements:   Reviewed by clinician on day of visit: allergies, medications, problem list, medical history, surgical history, family history, social history, and previous encounter notes.  Time spent on visit including pre-visit chart review and post-visit charting and care was 15 minutes.    I, Trixie Dredge, am acting as transcriptionist for Coralie Common, MD.  I have reviewed the above documentation for accuracy and completeness, and I agree with the above. - Jinny Blossom, MD

## 2020-02-03 ENCOUNTER — Ambulatory Visit (INDEPENDENT_AMBULATORY_CARE_PROVIDER_SITE_OTHER): Payer: BC Managed Care – PPO | Admitting: Psychology

## 2020-02-03 ENCOUNTER — Ambulatory Visit: Admit: 2020-02-03 | Payer: BC Managed Care – PPO | Admitting: Orthopedic Surgery

## 2020-02-03 DIAGNOSIS — F411 Generalized anxiety disorder: Secondary | ICD-10-CM

## 2020-02-03 SURGERY — ARTHROPLASTY, KNEE, TOTAL
Anesthesia: Choice | Site: Knee | Laterality: Right

## 2020-02-05 DIAGNOSIS — M1711 Unilateral primary osteoarthritis, right knee: Secondary | ICD-10-CM | POA: Diagnosis not present

## 2020-02-05 DIAGNOSIS — G8918 Other acute postprocedural pain: Secondary | ICD-10-CM | POA: Diagnosis not present

## 2020-02-10 ENCOUNTER — Encounter: Payer: Self-pay | Admitting: Family Medicine

## 2020-02-10 ENCOUNTER — Telehealth: Payer: BC Managed Care – PPO | Admitting: Family Medicine

## 2020-02-10 VITALS — BP 113/69 | Wt 235.0 lb

## 2020-02-10 DIAGNOSIS — R300 Dysuria: Secondary | ICD-10-CM

## 2020-02-10 DIAGNOSIS — N3000 Acute cystitis without hematuria: Secondary | ICD-10-CM | POA: Diagnosis not present

## 2020-02-10 DIAGNOSIS — Z9889 Other specified postprocedural states: Secondary | ICD-10-CM | POA: Diagnosis not present

## 2020-02-10 MED ORDER — NITROFURANTOIN MONOHYD MACRO 100 MG PO CAPS: 100.0000 mg | ORAL_CAPSULE | Freq: Two times a day (BID) | ORAL | 0 refills | Status: DC

## 2020-02-10 NOTE — Progress Notes (Signed)
° °  Subjective:  Documentation for virtual audio and video telecommunications through [place] encounter:  The patient was located at home. 2 patient identifiers used.  The provider was located in the office. The patient did consent to this visit and is aware of possible charges through their insurance for this visit.  The other persons participating in this telemedicine service were none. Time spent on call was 11 minutes and in review of previous records 15 minutes total.  This virtual service is not related to other E/M service within previous 7 days.   Patient ID: [name], female    DOB: 06/09/1970, 49 y.o.   MRN: [id number]  HPI Chief Complaint  Patient presents with   possible UTI    Burning with Urination and pain since satruday,  just had knee replacement    Complains of a 3 day history of dysuria, burning with urination. No urinary frequency or urgency.  Drinking plenty of water.  States she had terrible UTIs in 2020. Macrobid cleared it up.   States she had surgery, aknee replacement done 5 days ago.  She does not know if she had a urinary catheter or not. This was done by Dr. Maureen Ralphs at the [facility]   No fever, chills, back pain, abdominal pain, N/V/D.   States she is unable to get in a car to come over here.  Request that I treat her.    Review of Systems Pertinent positives and negatives in the history of present illness.     Objective:   Physical Exam BP 113/69    Wt 235 lb (106.6 kg)    BMI 39.11 kg/m   Alert and oriented in no acute distress.  She is lying down.  Respirations unlabored.      Assessment & Plan:  Acute cystitis without hematuria - Plan: nitrofurantoin, macrocrystal-monohydrate, (MACROBID) 100 MG capsule  Dysuria - Plan: nitrofurantoin, macrocrystal-monohydrate, (MACROBID) 100 MG capsule  Recent major surgery - Plan: nitrofurantoin, macrocrystal-monohydrate, (MACROBID) 100 MG capsule  I will treat her empirically  with Macrobid, this is worked for her in the past.  Discussed drinking plenty of fluids.  She may take Azo for 1 to 2 days and then stop.  I recommend that she call the surgical center and let them know about her symptoms.  Follow-up if she is not back to baseline when she finishes the antibiotic.  Discussed that we may have to have her come in and leave a urine sample at some point if she is not improving

## 2020-02-11 ENCOUNTER — Other Ambulatory Visit (INDEPENDENT_AMBULATORY_CARE_PROVIDER_SITE_OTHER): Payer: Self-pay | Admitting: Family Medicine

## 2020-02-11 DIAGNOSIS — F411 Generalized anxiety disorder: Secondary | ICD-10-CM

## 2020-02-11 DIAGNOSIS — F32A Depression, unspecified: Secondary | ICD-10-CM

## 2020-02-11 DIAGNOSIS — F419 Anxiety disorder, unspecified: Secondary | ICD-10-CM

## 2020-02-13 ENCOUNTER — Ambulatory Visit: Payer: BC Managed Care – PPO | Admitting: Psychology

## 2020-02-17 ENCOUNTER — Other Ambulatory Visit: Payer: Self-pay | Admitting: Family Medicine

## 2020-03-01 ENCOUNTER — Other Ambulatory Visit (INDEPENDENT_AMBULATORY_CARE_PROVIDER_SITE_OTHER): Payer: Self-pay | Admitting: Family Medicine

## 2020-03-01 DIAGNOSIS — E559 Vitamin D deficiency, unspecified: Secondary | ICD-10-CM

## 2020-03-02 ENCOUNTER — Ambulatory Visit (INDEPENDENT_AMBULATORY_CARE_PROVIDER_SITE_OTHER): Payer: BC Managed Care – PPO | Admitting: Psychology

## 2020-03-02 DIAGNOSIS — F411 Generalized anxiety disorder: Secondary | ICD-10-CM | POA: Diagnosis not present

## 2020-03-08 DIAGNOSIS — N3000 Acute cystitis without hematuria: Secondary | ICD-10-CM | POA: Diagnosis not present

## 2020-03-09 DIAGNOSIS — Z96651 Presence of right artificial knee joint: Secondary | ICD-10-CM | POA: Diagnosis not present

## 2020-03-10 ENCOUNTER — Ambulatory Visit (INDEPENDENT_AMBULATORY_CARE_PROVIDER_SITE_OTHER): Payer: BC Managed Care – PPO | Admitting: Psychology

## 2020-03-10 DIAGNOSIS — F331 Major depressive disorder, recurrent, moderate: Secondary | ICD-10-CM | POA: Diagnosis not present

## 2020-03-11 ENCOUNTER — Encounter (INDEPENDENT_AMBULATORY_CARE_PROVIDER_SITE_OTHER): Payer: Self-pay | Admitting: Family Medicine

## 2020-03-11 ENCOUNTER — Ambulatory Visit (INDEPENDENT_AMBULATORY_CARE_PROVIDER_SITE_OTHER): Payer: BC Managed Care – PPO | Admitting: Family Medicine

## 2020-03-11 ENCOUNTER — Encounter (INDEPENDENT_AMBULATORY_CARE_PROVIDER_SITE_OTHER): Payer: Self-pay

## 2020-03-11 ENCOUNTER — Other Ambulatory Visit: Payer: Self-pay

## 2020-03-12 NOTE — Telephone Encounter (Signed)
Please review

## 2020-03-17 ENCOUNTER — Ambulatory Visit (INDEPENDENT_AMBULATORY_CARE_PROVIDER_SITE_OTHER): Payer: BC Managed Care – PPO | Admitting: Psychology

## 2020-03-17 DIAGNOSIS — F331 Major depressive disorder, recurrent, moderate: Secondary | ICD-10-CM

## 2020-03-19 ENCOUNTER — Encounter (INDEPENDENT_AMBULATORY_CARE_PROVIDER_SITE_OTHER): Payer: Self-pay | Admitting: Family Medicine

## 2020-03-19 ENCOUNTER — Other Ambulatory Visit: Payer: Self-pay

## 2020-03-19 ENCOUNTER — Telehealth (INDEPENDENT_AMBULATORY_CARE_PROVIDER_SITE_OTHER): Payer: Self-pay

## 2020-03-19 ENCOUNTER — Ambulatory Visit (INDEPENDENT_AMBULATORY_CARE_PROVIDER_SITE_OTHER): Payer: BC Managed Care – PPO | Admitting: Family Medicine

## 2020-03-19 VITALS — BP 147/91 | HR 89 | Temp 98.3°F | Ht 65.0 in | Wt 235.0 lb

## 2020-03-19 DIAGNOSIS — E559 Vitamin D deficiency, unspecified: Secondary | ICD-10-CM

## 2020-03-19 DIAGNOSIS — Z9189 Other specified personal risk factors, not elsewhere classified: Secondary | ICD-10-CM

## 2020-03-19 DIAGNOSIS — F32A Depression, unspecified: Secondary | ICD-10-CM

## 2020-03-19 DIAGNOSIS — R41840 Attention and concentration deficit: Secondary | ICD-10-CM

## 2020-03-19 DIAGNOSIS — Z6839 Body mass index (BMI) 39.0-39.9, adult: Secondary | ICD-10-CM

## 2020-03-19 DIAGNOSIS — F419 Anxiety disorder, unspecified: Secondary | ICD-10-CM

## 2020-03-19 MED ORDER — SERTRALINE HCL 100 MG PO TABS: 100.0000 mg | ORAL_TABLET | Freq: Every day | ORAL | 0 refills | Status: DC

## 2020-03-19 MED ORDER — VITAMIN D (ERGOCALCIFEROL) 1.25 MG (50000 UNIT) PO CAPS: 50000.0000 [IU] | ORAL_CAPSULE | ORAL | 0 refills | Status: DC

## 2020-03-19 NOTE — Telephone Encounter (Signed)
I submitted referral information to [facility] 03/19/20. A rep will contact the pt for an appointment. CAS

## 2020-03-23 DIAGNOSIS — G43719 Chronic migraine without aura, intractable, without status migrainosus: Secondary | ICD-10-CM | POA: Diagnosis not present

## 2020-03-23 NOTE — Progress Notes (Signed)
Chief Complaint:   OBESITY [name] is here to discuss her progress with her obesity treatment plan along with follow-up of her obesity related diagnoses. [name] is on the Category 3 Plan and states she is following her eating plan approximately 0% of the time. [name] states she is doing 0 minutes 0 times per week.  Today's visit was #: 6 Starting weight: 239 lbs Starting date: 12/04/2019 Today's weight: 235 lbs Today's date: 03/19/2020 Total lbs lost to date: 4 Total lbs lost since last in-office visit: 2  Interim History: [name] is really enjoying the counselor that she is seeing. She is craving a significant amount of sweets and carbohydrates. She really struggled with staying on the plan. She feels overwhelmed. She wants to get back on track for the meal plan. She has food in the house but she struggles with food planning.  Subjective:   1. Difficulty concentrating [name] is having a difficult time paying bills and remembering appointments.  2. Vitamin D deficiency [name] denies nausea, vomiting, or muscle weakness, but notes fatigue. She is on prescription Vit D.  3. Anxiety and depression [name] denies suicidal or homicidal ideas. She is on Zoloft 50 mg, and she still notes feeling overwhelmed and struggles with depression.  4. At risk for side effect of medication [name] is at risk for drug side effects due to increased dose of Zoloft.  Assessment/Plan:   1. Difficulty concentrating We will refer to [facility]. Dory will continue to follow up as directed.  2. Vitamin D deficiency Low Vitamin D level contributes to fatigue and are associated with obesity, breast, and colon cancer. We will refill prescription Vitamin D for 1 month. Clementine will follow-up for routine testing of Vitamin D, at least 2-3 times per year to avoid over-replacement.  - Vitamin D, Ergocalciferol, (DRISDOL) 1.25 MG (50000 UNIT) CAPS capsule; Take 1 capsule (50,000 Units total)  by mouth every 7 (seven) days.  Dispense: 4 capsule; Refill: 0  3. Anxiety and depression Behavior modification techniques were discussed today to help [name] deal with her anxiety. [name] agreed to increase Zoloft to 100 mg daily, and we will refill for 1 month. Orders and follow up as documented in patient record.   - sertraline (ZOLOFT) 100 MG tablet; Take 1 tablet (100 mg total) by mouth daily.  Dispense: 30 tablet; Refill: 0  4. At risk for side effect of medication [name] was given approximately 15 minutes of drug side effect counseling today.  We discussed side effect possibility and risk versus benefits. [name] agreed to the medication and will contact this office if these side effects are intolerable.  Repetitive spaced learning was employed today to elicit superior memory formation and behavioral change.  5. Class 2 severe obesity with serious comorbidity and body mass index (BMI) of 39.0 to 39.9 in adult, unspecified obesity type [facility]) [name] is currently in the action stage of change. As such, her goal is to continue with weight loss efforts. She has agreed to the Category 3 Plan.   Exercise goals: No exercise has been prescribed at this time.  Behavioral modification strategies: increasing lean protein intake, meal planning and cooking strategies, keeping healthy foods in the home and planning for success.  [name] has agreed to follow-up with our clinic in 2 weeks. She was informed of the importance of frequent follow-up visits to maximize her success with intensive lifestyle modifications for her multiple health conditions.   Objective:   Blood pressure (!) 147/91, pulse  89, temperature 98.3 F (36.8 C), temperature source Oral, height 5\' 5"  (1.651 m), weight 235 lb (106.6 kg), SpO2 95 %. Body mass index is 39.11 kg/m.  General: Cooperative, alert, well developed, in no acute distress. HEENT: Conjunctivae and lids unremarkable. Cardiovascular: Regular rhythm.  Lungs:  Normal work of breathing. Neurologic: No focal deficits.   Lab Results  Component Value Date   CREATININE 0.58 12/04/2019   BUN 10 12/04/2019   NA 140 12/04/2019   K 4.2 12/04/2019   CL 103 12/04/2019   CO2 22 12/04/2019   Lab Results  Component Value Date   ALT 47 (H) 12/04/2019   AST 25 12/04/2019   ALKPHOS 130 (H) 12/04/2019   BILITOT 0.8 12/04/2019   Lab Results  Component Value Date   HGBA1C 5.5 09/20/2019   HGBA1C 5.4 06/24/2019   HGBA1C 5.4 04/27/2017   Lab Results  Component Value Date   INSULIN 26.1 (H) 12/04/2019   Lab Results  Component Value Date   TSH 2.760 09/20/2019   Lab Results  Component Value Date   CHOL 207 (H) 12/04/2019   HDL 50 12/04/2019   LDLCALC 134 (H) 12/04/2019   TRIG 131 12/04/2019   Lab Results  Component Value Date   WBC 8.2 09/20/2019   HGB 13.9 09/20/2019   HCT 42.3 09/20/2019   MCV 90 09/20/2019   PLT 327 09/20/2019   Lab Results  Component Value Date   IRON 70 09/20/2019   TIBC 359 09/20/2019   FERRITIN 82 09/20/2019   Attestation Statements:   Reviewed by clinician on day of visit: allergies, medications, problem list, medical history, surgical history, family history, social history, and previous encounter notes.   I, Trixie Dredge, am acting as transcriptionist for Coralie Common, MD.  I have reviewed the above documentation for accuracy and completeness, and I agree with the above. - Jinny Blossom, MD

## 2020-03-24 ENCOUNTER — Ambulatory Visit (INDEPENDENT_AMBULATORY_CARE_PROVIDER_SITE_OTHER): Payer: BC Managed Care – PPO | Admitting: Psychology

## 2020-03-24 DIAGNOSIS — F331 Major depressive disorder, recurrent, moderate: Secondary | ICD-10-CM | POA: Diagnosis not present

## 2020-03-31 ENCOUNTER — Ambulatory Visit (INDEPENDENT_AMBULATORY_CARE_PROVIDER_SITE_OTHER): Payer: BC Managed Care – PPO | Admitting: Psychology

## 2020-03-31 DIAGNOSIS — F411 Generalized anxiety disorder: Secondary | ICD-10-CM | POA: Diagnosis not present

## 2020-04-01 DIAGNOSIS — Z1339 Encounter for screening examination for other mental health and behavioral disorders: Secondary | ICD-10-CM | POA: Diagnosis not present

## 2020-04-01 DIAGNOSIS — F331 Major depressive disorder, recurrent, moderate: Secondary | ICD-10-CM | POA: Diagnosis not present

## 2020-04-01 DIAGNOSIS — F411 Generalized anxiety disorder: Secondary | ICD-10-CM | POA: Diagnosis not present

## 2020-04-01 DIAGNOSIS — R41840 Attention and concentration deficit: Secondary | ICD-10-CM | POA: Diagnosis not present

## 2020-04-06 ENCOUNTER — Ambulatory Visit (INDEPENDENT_AMBULATORY_CARE_PROVIDER_SITE_OTHER): Payer: BC Managed Care – PPO | Admitting: Family Medicine

## 2020-04-07 ENCOUNTER — Ambulatory Visit (INDEPENDENT_AMBULATORY_CARE_PROVIDER_SITE_OTHER): Payer: BC Managed Care – PPO | Admitting: Psychology

## 2020-04-07 DIAGNOSIS — F331 Major depressive disorder, recurrent, moderate: Secondary | ICD-10-CM

## 2020-04-08 ENCOUNTER — Other Ambulatory Visit (INDEPENDENT_AMBULATORY_CARE_PROVIDER_SITE_OTHER): Payer: Self-pay | Admitting: Family Medicine

## 2020-04-08 DIAGNOSIS — M542 Cervicalgia: Secondary | ICD-10-CM | POA: Diagnosis not present

## 2020-04-08 DIAGNOSIS — F32A Depression, unspecified: Secondary | ICD-10-CM

## 2020-04-08 DIAGNOSIS — F419 Anxiety disorder, unspecified: Secondary | ICD-10-CM

## 2020-04-08 DIAGNOSIS — G43719 Chronic migraine without aura, intractable, without status migrainosus: Secondary | ICD-10-CM | POA: Diagnosis not present

## 2020-04-14 ENCOUNTER — Ambulatory Visit (INDEPENDENT_AMBULATORY_CARE_PROVIDER_SITE_OTHER): Payer: BC Managed Care – PPO | Admitting: Psychology

## 2020-04-14 ENCOUNTER — Other Ambulatory Visit (INDEPENDENT_AMBULATORY_CARE_PROVIDER_SITE_OTHER): Payer: Self-pay | Admitting: Family Medicine

## 2020-04-14 DIAGNOSIS — F331 Major depressive disorder, recurrent, moderate: Secondary | ICD-10-CM

## 2020-04-14 DIAGNOSIS — E559 Vitamin D deficiency, unspecified: Secondary | ICD-10-CM

## 2020-04-15 DIAGNOSIS — R41840 Attention and concentration deficit: Secondary | ICD-10-CM | POA: Diagnosis not present

## 2020-04-21 ENCOUNTER — Ambulatory Visit: Payer: BC Managed Care – PPO | Admitting: Psychology

## 2020-04-21 DIAGNOSIS — G4710 Hypersomnia, unspecified: Secondary | ICD-10-CM | POA: Diagnosis not present

## 2020-04-21 DIAGNOSIS — G4733 Obstructive sleep apnea (adult) (pediatric): Secondary | ICD-10-CM | POA: Diagnosis not present

## 2020-04-23 ENCOUNTER — Ambulatory Visit (INDEPENDENT_AMBULATORY_CARE_PROVIDER_SITE_OTHER): Payer: BC Managed Care – PPO | Admitting: Psychology

## 2020-04-23 DIAGNOSIS — F331 Major depressive disorder, recurrent, moderate: Secondary | ICD-10-CM | POA: Diagnosis not present

## 2020-04-24 DIAGNOSIS — F331 Major depressive disorder, recurrent, moderate: Secondary | ICD-10-CM | POA: Diagnosis not present

## 2020-04-24 DIAGNOSIS — R41840 Attention and concentration deficit: Secondary | ICD-10-CM | POA: Diagnosis not present

## 2020-04-24 DIAGNOSIS — F411 Generalized anxiety disorder: Secondary | ICD-10-CM | POA: Diagnosis not present

## 2020-04-28 ENCOUNTER — Ambulatory Visit (INDEPENDENT_AMBULATORY_CARE_PROVIDER_SITE_OTHER): Payer: BC Managed Care – PPO | Admitting: Psychology

## 2020-04-28 DIAGNOSIS — F331 Major depressive disorder, recurrent, moderate: Secondary | ICD-10-CM

## 2020-05-05 ENCOUNTER — Ambulatory Visit: Payer: BC Managed Care – PPO | Admitting: Psychology

## 2020-05-07 DIAGNOSIS — F331 Major depressive disorder, recurrent, moderate: Secondary | ICD-10-CM | POA: Diagnosis not present

## 2020-05-07 DIAGNOSIS — F411 Generalized anxiety disorder: Secondary | ICD-10-CM | POA: Diagnosis not present

## 2020-05-07 DIAGNOSIS — R41840 Attention and concentration deficit: Secondary | ICD-10-CM | POA: Diagnosis not present

## 2020-05-12 ENCOUNTER — Ambulatory Visit: Payer: BC Managed Care – PPO | Admitting: Psychology

## 2020-05-19 ENCOUNTER — Ambulatory Visit: Payer: BC Managed Care – PPO | Admitting: Psychology

## 2020-05-21 DIAGNOSIS — F423 Hoarding disorder: Secondary | ICD-10-CM | POA: Diagnosis not present

## 2020-05-21 DIAGNOSIS — F331 Major depressive disorder, recurrent, moderate: Secondary | ICD-10-CM | POA: Diagnosis not present

## 2020-05-21 DIAGNOSIS — F411 Generalized anxiety disorder: Secondary | ICD-10-CM | POA: Diagnosis not present

## 2020-05-21 DIAGNOSIS — G4700 Insomnia, unspecified: Secondary | ICD-10-CM | POA: Diagnosis not present

## 2020-05-26 ENCOUNTER — Other Ambulatory Visit: Payer: Self-pay | Admitting: Family Medicine

## 2020-05-26 ENCOUNTER — Ambulatory Visit: Payer: BC Managed Care – PPO | Admitting: Psychology

## 2020-05-26 NOTE — Telephone Encounter (Signed)
Pt. requesting refill on Amlodipine and atorvastatin pt. Last apt was 02/10/20 and no future apt.

## 2020-05-26 NOTE — Telephone Encounter (Signed)
Ok to give 90 days but needs a visit in the next month with me.

## 2020-06-02 ENCOUNTER — Ambulatory Visit: Payer: BC Managed Care – PPO | Admitting: Psychology

## 2020-06-09 ENCOUNTER — Ambulatory Visit: Payer: BC Managed Care – PPO | Admitting: Psychology

## 2020-06-16 ENCOUNTER — Ambulatory Visit: Payer: BC Managed Care – PPO | Admitting: Psychology

## 2020-06-19 DIAGNOSIS — F4312 Post-traumatic stress disorder, chronic: Secondary | ICD-10-CM | POA: Diagnosis not present

## 2020-06-19 DIAGNOSIS — F411 Generalized anxiety disorder: Secondary | ICD-10-CM | POA: Diagnosis not present

## 2020-06-19 DIAGNOSIS — F423 Hoarding disorder: Secondary | ICD-10-CM | POA: Diagnosis not present

## 2020-06-19 DIAGNOSIS — F331 Major depressive disorder, recurrent, moderate: Secondary | ICD-10-CM | POA: Diagnosis not present

## 2020-06-23 DIAGNOSIS — G43719 Chronic migraine without aura, intractable, without status migrainosus: Secondary | ICD-10-CM | POA: Diagnosis not present

## 2020-07-09 DIAGNOSIS — G43719 Chronic migraine without aura, intractable, without status migrainosus: Secondary | ICD-10-CM | POA: Diagnosis not present

## 2020-07-09 DIAGNOSIS — M542 Cervicalgia: Secondary | ICD-10-CM | POA: Diagnosis not present

## 2020-07-17 DIAGNOSIS — F411 Generalized anxiety disorder: Secondary | ICD-10-CM | POA: Diagnosis not present

## 2020-07-17 DIAGNOSIS — F5081 Binge eating disorder: Secondary | ICD-10-CM | POA: Diagnosis not present

## 2020-07-17 DIAGNOSIS — F331 Major depressive disorder, recurrent, moderate: Secondary | ICD-10-CM | POA: Diagnosis not present

## 2020-07-17 DIAGNOSIS — F4312 Post-traumatic stress disorder, chronic: Secondary | ICD-10-CM | POA: Diagnosis not present

## 2020-07-20 ENCOUNTER — Telehealth: Payer: Self-pay

## 2020-07-20 DIAGNOSIS — G4710 Hypersomnia, unspecified: Secondary | ICD-10-CM | POA: Diagnosis not present

## 2020-07-20 DIAGNOSIS — K824 Cholesterolosis of gallbladder: Secondary | ICD-10-CM

## 2020-07-20 DIAGNOSIS — R7401 Elevation of levels of liver transaminase levels: Secondary | ICD-10-CM

## 2020-07-20 DIAGNOSIS — G4733 Obstructive sleep apnea (adult) (pediatric): Secondary | ICD-10-CM | POA: Diagnosis not present

## 2020-07-20 NOTE — Telephone Encounter (Signed)
-----   Message from Roetta Sessions, [place] sent at 08/01/2019  8:10 AM EDT ----- Regarding: 1 yr recall U/S due Pt due for Ultrasound RUQ - gallbladder polyps, fatty liver

## 2020-07-20 NOTE — Telephone Encounter (Signed)
Scheduled for March 16th at 8:30am, arr at 8:15am, NPO after midnight.  MyChart message sent to patient with phone number to reschedule if she can't make that date or time.

## 2020-07-20 NOTE — Telephone Encounter (Signed)
Called and LM for patient that she is due for U/S. Scheduled her for RUQ ultrasound at [facility] on March

## 2020-07-27 ENCOUNTER — Ambulatory Visit (INDEPENDENT_AMBULATORY_CARE_PROVIDER_SITE_OTHER): Payer: BC Managed Care – PPO | Admitting: Family Medicine

## 2020-07-29 ENCOUNTER — Ambulatory Visit (HOSPITAL_COMMUNITY): Admission: RE | Admit: 2020-07-29 | Payer: BC Managed Care – PPO | Source: Ambulatory Visit

## 2020-08-05 DIAGNOSIS — R519 Headache, unspecified: Secondary | ICD-10-CM | POA: Diagnosis not present

## 2020-08-05 DIAGNOSIS — M4802 Spinal stenosis, cervical region: Secondary | ICD-10-CM | POA: Diagnosis not present

## 2020-08-05 DIAGNOSIS — M9902 Segmental and somatic dysfunction of thoracic region: Secondary | ICD-10-CM | POA: Diagnosis not present

## 2020-08-05 DIAGNOSIS — M9901 Segmental and somatic dysfunction of cervical region: Secondary | ICD-10-CM | POA: Diagnosis not present

## 2020-08-11 DIAGNOSIS — M4802 Spinal stenosis, cervical region: Secondary | ICD-10-CM | POA: Diagnosis not present

## 2020-08-11 DIAGNOSIS — M9901 Segmental and somatic dysfunction of cervical region: Secondary | ICD-10-CM | POA: Diagnosis not present

## 2020-08-11 DIAGNOSIS — R519 Headache, unspecified: Secondary | ICD-10-CM | POA: Diagnosis not present

## 2020-08-11 DIAGNOSIS — M9902 Segmental and somatic dysfunction of thoracic region: Secondary | ICD-10-CM | POA: Diagnosis not present

## 2020-08-14 DIAGNOSIS — F4312 Post-traumatic stress disorder, chronic: Secondary | ICD-10-CM | POA: Diagnosis not present

## 2020-08-14 DIAGNOSIS — F5081 Binge eating disorder: Secondary | ICD-10-CM | POA: Diagnosis not present

## 2020-08-14 DIAGNOSIS — F411 Generalized anxiety disorder: Secondary | ICD-10-CM | POA: Diagnosis not present

## 2020-08-14 DIAGNOSIS — F331 Major depressive disorder, recurrent, moderate: Secondary | ICD-10-CM | POA: Diagnosis not present

## 2020-08-17 DIAGNOSIS — M4802 Spinal stenosis, cervical region: Secondary | ICD-10-CM | POA: Diagnosis not present

## 2020-08-17 DIAGNOSIS — R519 Headache, unspecified: Secondary | ICD-10-CM | POA: Diagnosis not present

## 2020-08-17 DIAGNOSIS — M9902 Segmental and somatic dysfunction of thoracic region: Secondary | ICD-10-CM | POA: Diagnosis not present

## 2020-08-17 DIAGNOSIS — M9901 Segmental and somatic dysfunction of cervical region: Secondary | ICD-10-CM | POA: Diagnosis not present

## 2020-08-23 ENCOUNTER — Other Ambulatory Visit: Payer: Self-pay | Admitting: Family Medicine

## 2020-09-07 DIAGNOSIS — M9902 Segmental and somatic dysfunction of thoracic region: Secondary | ICD-10-CM | POA: Diagnosis not present

## 2020-09-07 DIAGNOSIS — M9901 Segmental and somatic dysfunction of cervical region: Secondary | ICD-10-CM | POA: Diagnosis not present

## 2020-09-07 DIAGNOSIS — R519 Headache, unspecified: Secondary | ICD-10-CM | POA: Diagnosis not present

## 2020-09-07 DIAGNOSIS — M4802 Spinal stenosis, cervical region: Secondary | ICD-10-CM | POA: Diagnosis not present

## 2020-09-10 DIAGNOSIS — F5081 Binge eating disorder: Secondary | ICD-10-CM | POA: Diagnosis not present

## 2020-09-10 DIAGNOSIS — F331 Major depressive disorder, recurrent, moderate: Secondary | ICD-10-CM | POA: Diagnosis not present

## 2020-09-10 DIAGNOSIS — F4312 Post-traumatic stress disorder, chronic: Secondary | ICD-10-CM | POA: Diagnosis not present

## 2020-09-10 DIAGNOSIS — F411 Generalized anxiety disorder: Secondary | ICD-10-CM | POA: Diagnosis not present

## 2020-09-28 DIAGNOSIS — M9902 Segmental and somatic dysfunction of thoracic region: Secondary | ICD-10-CM | POA: Diagnosis not present

## 2020-09-28 DIAGNOSIS — M9901 Segmental and somatic dysfunction of cervical region: Secondary | ICD-10-CM | POA: Diagnosis not present

## 2020-09-28 DIAGNOSIS — M4802 Spinal stenosis, cervical region: Secondary | ICD-10-CM | POA: Diagnosis not present

## 2020-09-28 DIAGNOSIS — R519 Headache, unspecified: Secondary | ICD-10-CM | POA: Diagnosis not present

## 2020-10-09 DIAGNOSIS — F5081 Binge eating disorder: Secondary | ICD-10-CM | POA: Diagnosis not present

## 2020-10-09 DIAGNOSIS — F4312 Post-traumatic stress disorder, chronic: Secondary | ICD-10-CM | POA: Diagnosis not present

## 2020-10-09 DIAGNOSIS — F331 Major depressive disorder, recurrent, moderate: Secondary | ICD-10-CM | POA: Diagnosis not present

## 2020-10-09 DIAGNOSIS — F900 Attention-deficit hyperactivity disorder, predominantly inattentive type: Secondary | ICD-10-CM | POA: Diagnosis not present

## 2020-10-13 DIAGNOSIS — G43719 Chronic migraine without aura, intractable, without status migrainosus: Secondary | ICD-10-CM | POA: Diagnosis not present

## 2020-10-14 NOTE — Progress Notes (Signed)
Subjective:    Patient ID: [name], female    DOB: 05-20-1970, 50 y.o.   MRN: [id number]  HPI Chief Complaint  Patient presents with  . cpe    Nonfasting cpe, wants to come tomorrow for fasting labs. Needs refills on med. Has a gangion cyst on right palm, and hernia that GI said she has   She is here for a complete physical exam. States she is not fasting.   Other providers: GI- Dr. Havery Moros  GYN- Dr. Claiborne Billings  Cardiologist   No longer going to [facility]  States she did [facility]- genetic testing for medication  She is on Viibryd and her mood is going well.  [facility] in [place] out she has ADHD and is starting CBT soon. Does not want medication for ADHD   HTN- she has been out of BP medication   HL- out of Lipitor for a month.   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  Denies smoking, drinking alcohol, drug use  Diet: improving  Excerise: water aerobics, has a Physiological scientist. Losing inches   Immunizations: Covid 3 shots. Will think about Shingrix  Health maintenance:  Mammogram: 06/2019 Colonoscopy: UTD but done at [facility] 2018 Last Gynecological Exam: OB/GYN Last Dental Exam: regular  Last Eye Exam: last year   Wears seatbelt always, uses sunscreen, smoke detectors in home and functioning, does not text while driving and feels safe in home environment.   Reviewed allergies, medications, past medical, surgical, family, and social history.   Review of Systems Review of Systems Constitutional: -fever, -chills, -sweats, -unexpected weight change,-fatigue ENT: -runny nose, -ear pain, -sore throat Cardiology:  -chest pain, -palpitations, -edema Respiratory: -cough, -shortness of breath, -wheezing Gastroenterology: -abdominal pain, -nausea, -vomiting, -diarrhea, -constipation  Hematology:  -bleeding or bruising problems Musculoskeletal: -arthralgias, -myalgias, -joint swelling, -back pain Ophthalmology: -vision changes Urology: -dysuria, -difficulty urinating, -hematuria, -urinary frequency, -urgency Neurology: -headache, -weakness, -tingling, -numbness       Objective:   Physical Exam BP 110/70   Pulse 72   Ht 5' 5.5" (1.664 m)   Wt 266 lb 6.4 oz (120.8 kg)   BMI 43.66 kg/m   General Appearance:    Alert, cooperative, no distress, appears stated age  Head:    Normocephalic, without obvious abnormality, atraumatic  Eyes:    PERRL, conjunctiva/corneas clear, EOM's intact  Ears:    Normal TM's and external ear canals  Nose:  Mask on  Throat:  Mask on  Neck:   Supple, no lymphadenopathy;  thyroid:  no   enlargement/tenderness/nodules; no JVD  Back:    Spine nontender, no curvature, ROM normal, no CVA     tenderness  Lungs:     Clear to auscultation bilaterally without wheezes, rales or     ronchi; respirations unlabored  Chest Wall:    No tenderness or deformity   Heart:    Regular rate and rhythm, S1 and S2 normal, no murmur, rub   or gallop  Breast Exam:    No tenderness, masses, or nipple discharge or inversion.      No axillary lymphadenopathy  Abdomen:     Soft, non-tender, nondistended, normoactive bowel sounds,    no masses, no hepatosplenomegaly  Genitalia:   Declines.  OB/GYN     Extremities:   No clubbing,  cyanosis or edema  Pulses:   2+ and symmetric all extremities  Skin:   Skin color, texture, turgor normal, no rashes or lesions  Lymph nodes:   Cervical, supraclavicular, and axillary nodes normal  Neurologic:   CNII-XII intact, normal strength, sensation and gait          Psych:   Normal mood, affect, hygiene and grooming.        Assessment & Plan:  Routine general medical examination at a health care facility - Plan: CBC with Differential/Platelet, Comprehensive metabolic panel, TSH, T4, free, T3, Lipid panel -Preventive health care reviewed.   She sees several specialists.  Counseling on healthy lifestyle including diet and exercise.  Immunizations reviewed.  Discussed safety and health promotion.  H/O abdominal hysterectomy  Essential hypertension -Blood pressure controlled.  She has reportedly not taken her medication in 1 month.  She will keep a close eye on her blood pressure and we will start her medication if her blood pressure is consistently higher than goal range.  Recommend low-sodium diet and continuing to be active.  Elevated LDL cholesterol level - Plan: Lipid panel -Recommend she start back on statin therapy  Prediabetes - Plan: TSH, T4, free, T3, Hemoglobin A1c -Recommend low sugar and carbohydrate diet.  Follow-up pending A1c results  Morbid obesity ([place]) - Plan: TSH, T4, free, T3 -Recommend healthy diet and increasing physical activity.  She may consider going back to [facility] health weight management  Vitamin D deficiency - Plan: VITAMIN D 25 Hydroxy (Vit-D Deficiency, Fractures) -Check vitamin D level and adjust dose as appropriate  Anxiety and depression -She is doing much better.  Continue medication and seeing mental health specialist  Pain of right breast -Exam is unremarkable.  I will order a mammogram  Immunization counseling -she may return for Shingrix vaccine as desired.

## 2020-10-14 NOTE — Patient Instructions (Addendum)
Return tomorrow morning for fasting labs.  Keep an eye on your blood pressure at home. Goal blood pressure is 130/80 or lower.    Send me a message in 2 to 4 weeks with your blood pressure readings We will start you back on your amlodipine if needed.  Make sure you are eating a low-sodium diet.  Start back on your cholesterol pill.  You may return for a nurse visit to get the shingles vaccine if you would like.  This is a 2 shot series as discussed.  We will be in touch with your lab results.    Preventive Care 67-51 Years Old, Female Preventive care refers to lifestyle choices and visits with your health care provider that can promote health and wellness. This includes:  A yearly physical exam. This is also called an annual wellness visit.  Regular dental and eye exams.  Immunizations.  Screening for certain conditions.  Healthy lifestyle choices, such as: ? Eating a healthy diet. ? Getting regular exercise. ? Not using drugs or products that contain nicotine and tobacco. ? Limiting alcohol use. What can I expect for my preventive care visit? Physical exam Your health care provider will check your:  Height and weight. These may be used to calculate your BMI (body mass index). BMI is a measurement that tells if you are at a healthy weight.  Heart rate and blood pressure.  Body temperature.  Skin for abnormal spots. Counseling Your health care provider may ask you questions about your:  Past medical problems.  Family's medical history.  Alcohol, tobacco, and drug use.  Emotional well-being.  Home life and relationship well-being.  Sexual activity.  Diet, exercise, and sleep habits.  Work and work Statistician.  Access to firearms.  Method of birth control.  Menstrual cycle.  Pregnancy history. What immunizations do I need? Vaccines are usually given at various ages, according to a schedule. Your health care provider will recommend vaccines for you  based on your age, medical history, and lifestyle or other factors, such as travel or where you work.   What tests do I need? Blood tests  Lipid and cholesterol levels. These may be checked every 5 years, or more often if you are over 64 years old.  Hepatitis C test.  Hepatitis B test. Screening  Lung cancer screening. You may have this screening every year starting at age 81 if you have a 30-pack-year history of smoking and currently smoke or have quit within the past 15 years.  Colorectal cancer screening. ? All adults should have this screening starting at age 32 and continuing until age 62. ? Your health care provider may recommend screening at age 2 if you are at increased risk. ? You will have tests every 1-10 years, depending on your results and the type of screening test.  Diabetes screening. ? This is done by checking your blood sugar (glucose) after you have not eaten for a while (fasting). ? You may have this done every 1-3 years.  Mammogram. ? This may be done every 1-2 years. ? Talk with your health care provider about when you should start having regular mammograms. This may depend on whether you have a family history of breast cancer.  BRCA-related cancer screening. This may be done if you have a family history of breast, ovarian, tubal, or peritoneal cancers.  Pelvic exam and Pap test. ? This may be done every 3 years starting at age 42. ? Starting at age 90, this may be  done every 5 years if you have a Pap test in combination with an HPV test. Other tests  STD (sexually transmitted disease) testing, if you are at risk.  Bone density scan. This is done to screen for osteoporosis. You may have this scan if you are at high risk for osteoporosis. Talk with your health care provider about your test results, treatment options, and if necessary, the need for more tests. Follow these instructions at home: Eating and drinking  Eat a diet that includes fresh fruits and  vegetables, whole grains, lean protein, and low-fat dairy products.  Take vitamin and mineral supplements as recommended by your health care provider.  Do not drink alcohol if: ? Your health care provider tells you not to drink. ? You are pregnant, may be pregnant, or are planning to become pregnant.  If you drink alcohol: ? Limit how much you have to 0-1 drink a day. ? Be aware of how much alcohol is in your drink. In the [place] one drink equals one 12 oz bottle of beer (355 mL), one 5 oz glass of wine (148 mL), or one 1 oz glass of hard liquor (44 mL).   Lifestyle  Take daily care of your teeth and gums. Brush your teeth every morning and night with fluoride toothpaste. Floss one time each day.  Stay active. Exercise for at least 30 minutes 5 or more days each week.  Do not use any products that contain nicotine or tobacco, such as cigarettes, e-cigarettes, and chewing tobacco. If you need help quitting, ask your health care provider.  Do not use drugs.  If you are sexually active, practice safe sex. Use a condom or other form of protection to prevent STIs (sexually transmitted infections).  If you do not wish to become pregnant, use a form of birth control. If you plan to become pregnant, see your health care provider for a prepregnancy visit.  If told by your health care provider, take low-dose aspirin daily starting at age 72.  Find healthy ways to cope with stress, such as: ? Meditation, yoga, or listening to music. ? Journaling. ? Talking to a trusted person. ? Spending time with friends and family. Safety  Always wear your seat belt while driving or riding in a vehicle.  Do not drive: ? If you have been drinking alcohol. Do not ride with someone who has been drinking. ? When you are tired or distracted. ? While texting.  Wear a helmet and other protective equipment during sports activities.  If you have firearms in your house, make sure you follow all gun safety  procedures. What's next?  Visit your health care provider once a year for an annual wellness visit.  Ask your health care provider how often you should have your eyes and teeth checked.  Stay up to date on all vaccines. This information is not intended to replace advice given to you by your health care provider. Make sure you discuss any questions you have with your health care provider. Document Revised: 02/04/2020 Document Reviewed: 01/11/2018 Elsevier Patient Education  2021 [employer].

## 2020-10-15 ENCOUNTER — Encounter: Payer: Self-pay | Admitting: Gastroenterology

## 2020-10-15 ENCOUNTER — Other Ambulatory Visit (INDEPENDENT_AMBULATORY_CARE_PROVIDER_SITE_OTHER): Payer: BC Managed Care – PPO

## 2020-10-15 ENCOUNTER — Other Ambulatory Visit: Payer: Self-pay

## 2020-10-15 ENCOUNTER — Ambulatory Visit (INDEPENDENT_AMBULATORY_CARE_PROVIDER_SITE_OTHER): Payer: BC Managed Care – PPO | Admitting: Family Medicine

## 2020-10-15 ENCOUNTER — Encounter: Payer: Self-pay | Admitting: Family Medicine

## 2020-10-15 ENCOUNTER — Ambulatory Visit (INDEPENDENT_AMBULATORY_CARE_PROVIDER_SITE_OTHER): Payer: BC Managed Care – PPO | Admitting: Gastroenterology

## 2020-10-15 VITALS — BP 110/70 | HR 72 | Ht 65.5 in | Wt 266.4 lb

## 2020-10-15 VITALS — BP 152/94 | HR 84 | Ht 65.0 in | Wt 265.4 lb

## 2020-10-15 DIAGNOSIS — I10 Essential (primary) hypertension: Secondary | ICD-10-CM

## 2020-10-15 DIAGNOSIS — R7303 Prediabetes: Secondary | ICD-10-CM | POA: Diagnosis not present

## 2020-10-15 DIAGNOSIS — K529 Noninfective gastroenteritis and colitis, unspecified: Secondary | ICD-10-CM

## 2020-10-15 DIAGNOSIS — Z90710 Acquired absence of both cervix and uterus: Secondary | ICD-10-CM | POA: Diagnosis not present

## 2020-10-15 DIAGNOSIS — K219 Gastro-esophageal reflux disease without esophagitis: Secondary | ICD-10-CM

## 2020-10-15 DIAGNOSIS — K760 Fatty (change of) liver, not elsewhere classified: Secondary | ICD-10-CM | POA: Diagnosis not present

## 2020-10-15 DIAGNOSIS — Z7185 Encounter for immunization safety counseling: Secondary | ICD-10-CM | POA: Diagnosis not present

## 2020-10-15 DIAGNOSIS — Z0000 Encounter for general adult medical examination without abnormal findings: Secondary | ICD-10-CM

## 2020-10-15 DIAGNOSIS — E7800 Pure hypercholesterolemia, unspecified: Secondary | ICD-10-CM | POA: Diagnosis not present

## 2020-10-15 DIAGNOSIS — F419 Anxiety disorder, unspecified: Secondary | ICD-10-CM

## 2020-10-15 DIAGNOSIS — F32A Depression, unspecified: Secondary | ICD-10-CM

## 2020-10-15 DIAGNOSIS — K824 Cholesterolosis of gallbladder: Secondary | ICD-10-CM | POA: Diagnosis not present

## 2020-10-15 DIAGNOSIS — E559 Vitamin D deficiency, unspecified: Secondary | ICD-10-CM

## 2020-10-15 DIAGNOSIS — N644 Mastodynia: Secondary | ICD-10-CM

## 2020-10-15 DIAGNOSIS — M542 Cervicalgia: Secondary | ICD-10-CM | POA: Diagnosis not present

## 2020-10-15 DIAGNOSIS — G43719 Chronic migraine without aura, intractable, without status migrainosus: Secondary | ICD-10-CM | POA: Diagnosis not present

## 2020-10-15 MED ORDER — FAMOTIDINE 10 MG PO TABS: 10.0000 mg | ORAL_TABLET | Freq: Every day | ORAL | Status: DC

## 2020-10-15 MED ORDER — COLESTIPOL HCL 1 G PO TABS: 1.0000 g | ORAL_TABLET | Freq: Two times a day (BID) | ORAL | 2 refills | Status: DC

## 2020-10-15 NOTE — Patient Instructions (Addendum)
If you are age 51 or older, your body mass index should be between 23-30. Your Body mass index is 44.16 kg/m. If this is out of the aforementioned range listed, please consider follow up with your Primary Care Provider.  If you are age 20 or younger, your body mass index should be between 19-25. Your Body mass index is 44.16 kg/m. If this is out of the aformentioned range listed, please consider follow up with your Primary Care Provider.   __________________________________________________________  The [facility] providers would like to encourage you to use [facility] to communicate with providers for non-urgent requests or questions.  Due to long hold times on the telephone, sending your provider a message by [facility] may be a faster and more efficient way to get a response.  Please allow 48 business hours for a response.  Please remember that this is for non-urgent requests.  __________________________________________________________  Dennis Bast have been scheduled for an abdominal ultrasound at [facility] (1st floor of hospital) on Wednesday, 10-21-20 at 10:30am. Please arrive 15 minutes prior to your appointment for registration. Make certain not to have anything to eat or drink 6 hours prior to your appointment. Should you need to reschedule your appointment, please contact radiology at [phone]. This test typically takes about 30 minutes to perform.  ___________________________________________________________  Please go to the lab in the basement of our building to have lab work done as you leave today. Hit "B" for basement when you get on the elevator.  When the doors open the lab is on your left.  We will call you with the results. Thank you.  We have sent the following medications to your pharmacy for you to pick up at your convenience: Colestid 1g: Take one to two tablets twice daily  Please purchase the following medications over the counter and take as directed: Pepcid: Take 10 to  20 mg once to twice daily as needed  Please call us in 2 weeks and provide an update on how you are doing.  Thank you for entrusting me with your care and for choosing [facility], Dr. Egypt Cellar

## 2020-10-15 NOTE — Progress Notes (Signed)
HPI :  50 year old female here for a follow-up for chronic diarrhea.  I last saw her in March 2021.  She also has a history of fatty liver with elevated liver enzymes, history of cervical cancer.  Recall that she has had chronic diarrhea for as long as she can remember, dating back to childhood.  She has had an extensive evaluation for this in the past including 2 colonoscopies, endoscopy, negative celiac testing, a few CT scans. She has had history of perirectal abscess with associated fistula in the past between 2016 and 2018 although carries no diagnosis of Crohn's disease or IBD.   At her last visit we discussed options, I gave her a trial of Viberzi 100 mg twice daily.  She states this did control her diarrhea, and she liked that aspect of it however it made her so drowsy she could not function on it.  It was also incredibly expensive for her and she could not afford it.  So she stopped it.  She has been taking Imodium to manage her symptoms as needed.  If she does not take anything for her bowels, she will have 10-12 bowel movements per day.  Stools sometimes loose but not always.  She often has urgency and need to use bathroom shortly after eating which can precipitate her symptoms.  This is really affecting her lifestyle, she does not do normal activities due to her symptoms, she feels like it is contributing to her depression.  She is not using NSAIDs.  She has used Imodium's as needed and that can help, if she has 2 Imodium in the morning it will slow her down but then she can get constipated.  She has taken upwards of 8 Imodium a day but that made severe constipation and she is scared of that.  She also has had chronic elevation in her liver enzymes.  She has had fatty liver noted on prior ultrasound a year ago as well as a 5 mm gallbladder polyp.  She does not drink any alcohol routinely.  Her body mass index is 44.  She has had 2 knee replacements over the past year, has had problems with her  mobility and difficulty with weight loss.  She suffers from depression and is currently on Viibryd which she states has really helped her over the last 6 months.  She is hoping to use cognitive behavioral feedback therapy for weight loss, she has seen weight loss clinics in the past.  She does not drink coffee has not makes her IBS worse.  She has had a serologic work-up with [place] otherwise that has been negative for any other cause of chronic elevation liver enzymes.  She has been vaccinated hepatitis B since her last visit.  She does endorse worsening reflux symptoms in recent months.  She takes Tums a few days per week for heartburn which has been bothering her lately.  She otherwise does not take any PPI or H2 blockers, she inquires about options.  No dysphagia or alarm symptoms.   Prior work-up for this to include a colonoscopy with Dr. Penelope Coop of GI at [facility] done 03/22/2017, remarkable findings include sigmoid diverticulosis, otherwise normal.  Biopsies for microscopic colitis negative.  She has had negative serologic testing for celiac disease.  I also see reports from a colonoscopy with Dr. Roney Mans in [place] from 2014 were random biopsies were negative for microscopic colitis and an EGD with small bowel biopsies were negative for celiac disease.  She has had a  CT scan of the abdomen pelvis in 2016 and 2018 without any evidence of small bowel inflammation concerning for Crohn's.   RUQ [place] 07/31/19 - IMPRESSION: 1. Increased liver echogenicity, a finding indicative of a degree of hepatic steatosis. No focal liver lesions evident.  2. Apparent gallbladder polyps, largest measuring 5 mm in length. Per consensus guidelines, polyps of this size do not warrant additional imaging surveillance. No gallstones, gallbladder wall thickening, or pericholecystic fluid.    Past Medical History:  Diagnosis Date  . 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 cervical ca  . ADD  (attention deficit disorder)   . Anal fistula   . Anxiety   . B12 deficiency   . Back pain   . Bilateral swelling of feet   . Cervical cancer [facility]) current oncologist -- dr Durwin Reges ([facility])/  previously dr Rayford Halsted 2 [facility]   dx 12/ 2014 invasive cervical cancer--- Stage IB2--- 06-11-2013  s/p  TAH w/ BSO and pelvic node dissection,  completed chemoradiation 04/ 2015  . Endometriosis   . Fatigue   . Fatty liver   . Fibroid   . Hemorrhoids   . History of cancer chemotherapy    FOR CERVICAL CANCER , COMPLETED 08-2013  . History of radiation therapy    FOR CERVICAL CANCER , COMPLETED 08-2013  . HTN (hypertension)   . Hypercholesteremia   . Hypertension   . IBS (irritable bowel syndrome)   . Joint pain   . Lactose intolerance   . Migraines   . Multiple food allergies   . Nodule of upper lobe of left lung    last CT chest 05-15-2017 (care everywhere in epic)   . OSA on CPAP   . Prediabetes   . SOB (shortness of breath)   . 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 Urinary incontinence   . Vitamin D deficiency      Past Surgical History:  Procedure Laterality Date  . ABDOMINAL HYSTERECTOMY  06/11/2013   TAH/BSO  . BREAST SURGERY Right 11/25/2008   2 benign lumps  . COLONOSCOPY    . COLPOSCOPY    . EVALUATION UNDER ANESTHESIA WITH ANAL FISTULECTOMY N/A 07/06/2017   Procedure: ANAL EXAM UNDER ANESTHESIA;  Surgeon: Leighton Ruff, MD;  Location: [place];  Service: General;  Laterality: N/A;  . HERNIA REPAIR     at hysterectomy surgical site  double  . INCISION AND DRAINAGE PERIRECTAL ABSCESS N/A 04/26/2017   Procedure: IRRIGATION AND DEBRIDEMENT PERIRECTAL ABSCESS;  Surgeon: Johnathan Hausen, MD;  Location: [facility];  Service: General;  Laterality: N/A;  . LAPAROSCOPY INCISIONAL HERNIA REPAIR W/ MESH, LYSIS ADHESIONS  01-10-2014   @[facility]   . LIGATION OF INTERNAL FISTULA TRACT N/A 03/08/2018   Procedure: LIGATION OF  INTERNAL FISTULA TRACT;  Surgeon: Leighton Ruff, MD;  Location: [place];  Service: General;  Laterality: N/A;  . [place] N/A 07/06/2017   Procedure: PLACEMENT OF SETON;  Surgeon: Leighton Ruff, MD;  Location: [facility];  Service: General;  Laterality: N/A;  . TOTAL ABDOMINAL HYSTERECTOMY W/ BILATERAL SALPINGOOPHORECTOMY  06-11-2013    dr Rayford Halsted @[facility]    W/ PELVIC LYMPH NODE DISSECTIONS  . TOTAL KNEE ARTHROPLASTY Left 04/29/2019   Procedure: TOTAL KNEE ARTHROPLASTY;  Surgeon: Gaynelle Arabian, MD;  Location: [facility];  Service: Orthopedics;  Laterality: Left;  41min  . WISDOM TOOTH EXTRACTION  Family History  Problem Relation Age of Onset  . Hyperlipidemia Mother   . Hypertension Mother   . Diabetes Father   . Irritable bowel syndrome Father   . Bladder Cancer Father 70  . Hypertension Father   . Hyperlipidemia Father   . Cancer Father   . Obesity Father   . Diabetes Maternal Grandfather   . Stroke Maternal Grandfather   . Heart disease Maternal Grandfather        pacemaker   . Colon cancer Paternal Uncle        ?  Marland Kitchen Pancreatic cancer Neg Hx   . Stomach cancer Neg Hx   . Esophageal cancer Neg Hx    Social History   Tobacco Use  . Smoking status: Former Smoker    Quit date: 04/26/1998    Years since quitting: 22.4  . Smokeless tobacco: Never Used  Vaping Use  . Vaping Use: Never used  Substance Use Topics  . Alcohol use: Not Currently    Alcohol/week: 0.0 standard drinks    Comment: social  . Drug use: No   Current Outpatient Medications  Medication Sig Dispense Refill  . acetaminophen (TYLENOL) 500 MG tablet Take 500 mg by mouth every 6 (six) hours as needed.    Marland Kitchen amLODipine (NORVASC) 5 MG tablet TAKE 1 TABLET(5 MG) BY MOUTH DAILY 90 tablet 0  . atorvastatin (LIPITOR) 10 MG tablet TAKE 1 TABLET(10 MG) BY MOUTH DAILY AT 6 PM 90 tablet 0  . AZO-CRANBERRY PO Take 2 tablets by mouth daily.    . chlorproMAZINE  (THORAZINE) 25 MG tablet Take 25 mg by mouth daily as needed (migraine headaches).     . Loperamide HCl (IMODIUM A-D PO) Take 1 tablet by mouth as needed.    . Multiple Vitamin (MULTIVITAMIN WITH MINERALS) TABS tablet Take 1 tablet by mouth daily.    . Omega-3 Fatty Acids (FISH OIL) 1000 MG CAPS Take 1,000 tablets by mouth daily.     Vladimir Faster Glycol-Propyl Glycol (SYSTANE OP) Apply 1 drop to eye daily as needed (dry/irritated eyes).    . Vilazodone HCl (VIIBRYD) 20 MG TABS Take 1 tablet by mouth daily.    . Vilazodone HCl (VIIBRYD) 40 MG TABS Take 40 mg by mouth daily.    . Vitamin D, Ergocalciferol, (DRISDOL) 1.25 MG (50000 UNIT) CAPS capsule Take 1 capsule (50,000 Units total) by mouth every 7 (seven) days. 4 capsule 0   No current facility-administered medications for this visit.   Allergies  Allergen Reactions  . Amoxicillin Rash    Has patient had a PCN reaction causing immediate rash, facial/tongue/throat swelling, SOB or lightheadedness with hypotension: Unknown Has patient had a PCN reaction causing severe rash involving mucus membranes or skin necrosis: Yes Has patient had a PCN reaction that required hospitalization: No Has patient had a PCN reaction occurring within the last 10 years: No If all of the above answers are "NO", then may proceed with Cephalosporin use.   Marland Kitchen Penicillins Rash    Has patient had a PCN reaction causing immediate rash, facial/tongue/throat swelling, SOB or lightheadedness with hypotension: Unknown Has patient had a PCN reaction causing severe rash involving mucus membranes or skin necrosis: Yes Has patient had a PCN reaction that required hospitalization: No Has patient had a PCN reaction occurring within the last 10 years: No If all of the above answers are "NO", then may proceed with Cephalosporin use.   . Sulfa Antibiotics Rash  . Other Diarrhea and Other (See  Comments)    Nuts, dairy, lettuce, peanuts, gluten, corn and wheat dairy, lettuce,   corn      Review of Systems: All systems reviewed and negative except where noted in HPI.   Lab Results  Component Value Date   WBC 8.2 09/20/2019   HGB 13.9 09/20/2019   HCT 42.3 09/20/2019   MCV 90 09/20/2019   PLT 327 09/20/2019    Lab Results  Component Value Date   ALT 47 (H) 12/04/2019   AST 25 12/04/2019   ALKPHOS 130 (H) 12/04/2019   BILITOT 0.8 12/04/2019    Lab Results  Component Value Date   CREATININE 0.58 12/04/2019   BUN 10 12/04/2019   NA 140 12/04/2019   K 4.2 12/04/2019   CL 103 12/04/2019   CO2 22 12/04/2019     Physical Exam: BP (!) 152/94 (BP Location: Left Arm, Patient Position: Sitting, Cuff Size: Large)   Pulse 84   Ht 5\' 5"  (1.651 m)   Wt 265 lb 6 oz (120.4 kg)   SpO2 97%   BMI 44.16 kg/m  Constitutional: Pleasant,well-developed, female in no acute distress. Abdominal: Soft, nondistended, nontender, suspect ventral hernia upper abdomen, mid lower abdominal scar.   Extremities: no edema Neurological: Alert and oriented to person place and time. Psychiatric: Normal mood and affect. Behavior is normal.   ASSESSMENT AND PLAN: 50 year old female here for reassessment of following:  Chronic diarrhea Fatty liver Gallbladder polyp GERD  We discussed all these issues at length today.  She has persistent chronic diarrhea has had an extensive evaluation as above, unclear if this is IBS or other.  She did have a favorable response to Viberzi however it was too expensive and cause significant drowsiness so not able to tolerate that long-term.  She does respond to Imodium but that can constipate her and she feels poorly when that happens, so she is wary of using that.  We discussed other options.  I would like to try her empirically on a trial of Colestid 1 g twice daily to see if that may help in case she has bile salt diarrhea contributing to this.  Can increase to 2 tabs twice daily if needed.  I would like to see how she responds to this and she  should contact me in a few weeks for reassessment.  If this does not work can consider a trial of Alosetron if symptoms that severe for her.  Alternatively she could try a lower dose Imodium such as 1 tab every morning and titrate up as needed although again she would prefer to avoid that.  We otherwise discussed history of fatty liver, weight loss has been problematic for her.  She is having depression treated and undergoing cognitive behavioral therapy and she hopes will help with that.  She has seen a weight loss clinic already.  We discussed long-term risks of fatty liver to include NASH/cirrhosis.  No evidence of that on imaging to date but she has had persistent elevation in liver enzymes.  She is due for repeat LFTs now.  Given her history of gallbladder polyp we will reassess with another ultrasound to ensure no interval enlargement, will also provide peace of mind if no evidence of cirrhosis which she is concerned about.  Regards to her reflux, has mild intermittent symptoms.  Recommend trial of Pepcid 10 to 20 mg once to twice daily as needed, can titrate up or down as needed, if she fails this can consider trial of PPI.  Plan: -  trial of colestid 1gm BID, can increase to BID if needed - RUQ US - gallbladder polyp, fatty liver - counseled on fatty liver as above, due for repeat LFTs - trial of pepcid 10-20mg  daily to BID for GERD, titrate as needed, has it at home - patient will contact me in a few weeks with an update on how she is doing so I can provide additional recommendations pending her course  [place] Bonita Cellar, MD [facility]

## 2020-10-16 ENCOUNTER — Encounter: Payer: Self-pay | Admitting: Internal Medicine

## 2020-10-16 ENCOUNTER — Other Ambulatory Visit: Payer: Self-pay | Admitting: Internal Medicine

## 2020-10-16 ENCOUNTER — Other Ambulatory Visit: Payer: BC Managed Care – PPO

## 2020-10-16 DIAGNOSIS — E7800 Pure hypercholesterolemia, unspecified: Secondary | ICD-10-CM | POA: Diagnosis not present

## 2020-10-16 DIAGNOSIS — Z0000 Encounter for general adult medical examination without abnormal findings: Secondary | ICD-10-CM | POA: Diagnosis not present

## 2020-10-16 DIAGNOSIS — E559 Vitamin D deficiency, unspecified: Secondary | ICD-10-CM | POA: Diagnosis not present

## 2020-10-16 DIAGNOSIS — R7303 Prediabetes: Secondary | ICD-10-CM | POA: Diagnosis not present

## 2020-10-16 DIAGNOSIS — N644 Mastodynia: Secondary | ICD-10-CM

## 2020-10-16 LAB — HEPATIC FUNCTION PANEL
ALT: 40 U/L — ABNORMAL HIGH (ref 0–35)
AST: 24 U/L (ref 0–37)
Albumin: 4.4 g/dL (ref 3.5–5.2)
Alkaline Phosphatase: 104 U/L (ref 39–117)
Bilirubin, Direct: 0.1 mg/dL (ref 0.0–0.3)
Total Bilirubin: 1.2 mg/dL (ref 0.2–1.2)
Total Protein: 7.7 g/dL (ref 6.0–8.3)

## 2020-10-17 LAB — CBC WITH DIFFERENTIAL/PLATELET
Basophils Absolute: 0.1 10*3/uL (ref 0.0–0.2)
Basos: 1 %
EOS (ABSOLUTE): 0.3 10*3/uL (ref 0.0–0.4)
Eos: 4 %
Hematocrit: 41.4 % (ref 34.0–46.6)
Hemoglobin: 14 g/dL (ref 11.1–15.9)
Immature Grans (Abs): 0 10*3/uL (ref 0.0–0.1)
Immature Granulocytes: 0 %
Lymphocytes Absolute: 2.3 10*3/uL (ref 0.7–3.1)
Lymphs: 28 %
MCH: 30.4 pg (ref 26.6–33.0)
MCHC: 33.8 g/dL (ref 31.5–35.7)
MCV: 90 fL (ref 79–97)
Monocytes Absolute: 0.9 10*3/uL (ref 0.1–0.9)
Monocytes: 11 %
Neutrophils Absolute: 4.5 10*3/uL (ref 1.4–7.0)
Neutrophils: 56 %
Platelets: 303 10*3/uL (ref 150–450)
RBC: 4.6 x10E6/uL (ref 3.77–5.28)
RDW: 13.1 % (ref 11.7–15.4)
WBC: 8 10*3/uL (ref 3.4–10.8)

## 2020-10-17 LAB — TSH: TSH: 1.86 u[IU]/mL (ref 0.450–4.500)

## 2020-10-17 LAB — LIPID PANEL
Chol/HDL Ratio: 5.7 ratio — ABNORMAL HIGH (ref 0.0–4.4)
Cholesterol, Total: 232 mg/dL — ABNORMAL HIGH (ref 100–199)
HDL: 41 mg/dL (ref >39)
LDL Chol Calc (NIH): 146 mg/dL — ABNORMAL HIGH (ref 0–99)
Triglycerides: 246 mg/dL — ABNORMAL HIGH (ref 0–149)
VLDL Cholesterol Cal: 45 mg/dL — ABNORMAL HIGH (ref 5–40)

## 2020-10-17 LAB — COMPREHENSIVE METABOLIC PANEL
ALT: 39 IU/L — ABNORMAL HIGH (ref 0–32)
AST: 24 IU/L (ref 0–40)
Albumin/Globulin Ratio: 1.5 (ref 1.2–2.2)
Albumin: 4.2 g/dL (ref 3.8–4.8)
Alkaline Phosphatase: 110 IU/L (ref 44–121)
BUN/Creatinine Ratio: 18 (ref 9–23)
BUN: 13 mg/dL (ref 6–24)
Bilirubin Total: 1 mg/dL (ref 0.0–1.2)
CO2: 24 mmol/L (ref 20–29)
Calcium: 9 mg/dL (ref 8.7–10.2)
Chloride: 99 mmol/L (ref 96–106)
Creatinine, Ser: 0.73 mg/dL (ref 0.57–1.00)
Globulin, Total: 2.8 g/dL (ref 1.5–4.5)
Glucose: 98 mg/dL (ref 65–99)
Potassium: 4.5 mmol/L (ref 3.5–5.2)
Sodium: 139 mmol/L (ref 134–144)
Total Protein: 7 g/dL (ref 6.0–8.5)
eGFR: 100 mL/min/{1.73_m2} (ref >59)

## 2020-10-17 LAB — T4, FREE: Free T4: 0.95 ng/dL (ref 0.82–1.77)

## 2020-10-17 LAB — HEMOGLOBIN A1C
Est. average glucose Bld gHb Est-mCnc: 120 mg/dL
Hgb A1c MFr Bld: 5.8 % — ABNORMAL HIGH (ref 4.8–5.6)

## 2020-10-17 LAB — T3: T3, Total: 153 ng/dL (ref 71–180)

## 2020-10-17 LAB — VITAMIN D 25 HYDROXY (VIT D DEFICIENCY, FRACTURES): Vit D, 25-Hydroxy: 34.2 ng/mL (ref 30.0–100.0)

## 2020-10-19 DIAGNOSIS — G4733 Obstructive sleep apnea (adult) (pediatric): Secondary | ICD-10-CM | POA: Diagnosis not present

## 2020-10-19 DIAGNOSIS — G4710 Hypersomnia, unspecified: Secondary | ICD-10-CM | POA: Diagnosis not present

## 2020-10-20 NOTE — Progress Notes (Signed)
Needs fasting labs in 6 weeks. Please schedule and put in order for lipids only. I sent her a mychart message with more details.

## 2020-10-21 ENCOUNTER — Other Ambulatory Visit: Payer: Self-pay

## 2020-10-21 ENCOUNTER — Other Ambulatory Visit: Payer: Self-pay | Admitting: Internal Medicine

## 2020-10-21 ENCOUNTER — Ambulatory Visit (HOSPITAL_COMMUNITY)
Admission: RE | Admit: 2020-10-21 | Discharge: 2020-10-21 | Disposition: A | Discharge status: Home / Self Care | Payer: BC Managed Care – PPO | Source: Ambulatory Visit | Attending: Gastroenterology | Admitting: Gastroenterology

## 2020-10-21 DIAGNOSIS — K824 Cholesterolosis of gallbladder: Secondary | ICD-10-CM | POA: Diagnosis not present

## 2020-10-21 DIAGNOSIS — K760 Fatty (change of) liver, not elsewhere classified: Secondary | ICD-10-CM | POA: Diagnosis not present

## 2020-10-21 DIAGNOSIS — E7800 Pure hypercholesterolemia, unspecified: Secondary | ICD-10-CM

## 2020-10-21 DIAGNOSIS — K219 Gastro-esophageal reflux disease without esophagitis: Secondary | ICD-10-CM | POA: Insufficient documentation

## 2020-10-21 DIAGNOSIS — K529 Noninfective gastroenteritis and colitis, unspecified: Secondary | ICD-10-CM

## 2020-10-22 DIAGNOSIS — F331 Major depressive disorder, recurrent, moderate: Secondary | ICD-10-CM | POA: Diagnosis not present

## 2020-10-22 DIAGNOSIS — F411 Generalized anxiety disorder: Secondary | ICD-10-CM | POA: Diagnosis not present

## 2020-10-22 DIAGNOSIS — F4312 Post-traumatic stress disorder, chronic: Secondary | ICD-10-CM | POA: Diagnosis not present

## 2020-10-22 DIAGNOSIS — Z1339 Encounter for screening examination for other mental health and behavioral disorders: Secondary | ICD-10-CM | POA: Diagnosis not present

## 2020-10-26 ENCOUNTER — Other Ambulatory Visit: Payer: Self-pay | Admitting: Family Medicine

## 2020-10-26 DIAGNOSIS — N644 Mastodynia: Secondary | ICD-10-CM

## 2020-11-05 ENCOUNTER — Other Ambulatory Visit: Payer: Self-pay | Admitting: Family Medicine

## 2020-11-05 ENCOUNTER — Encounter: Payer: Self-pay | Admitting: Family Medicine

## 2020-11-05 MED ORDER — AMLODIPINE BESYLATE 5 MG PO TABS: ORAL_TABLET | ORAL | 1 refills | Status: DC

## 2020-11-26 DIAGNOSIS — F4312 Post-traumatic stress disorder, chronic: Secondary | ICD-10-CM | POA: Diagnosis not present

## 2020-11-26 DIAGNOSIS — F331 Major depressive disorder, recurrent, moderate: Secondary | ICD-10-CM | POA: Diagnosis not present

## 2020-11-26 DIAGNOSIS — F5081 Binge eating disorder: Secondary | ICD-10-CM | POA: Diagnosis not present

## 2020-11-26 DIAGNOSIS — F411 Generalized anxiety disorder: Secondary | ICD-10-CM | POA: Diagnosis not present

## 2020-12-01 DIAGNOSIS — F900 Attention-deficit hyperactivity disorder, predominantly inattentive type: Secondary | ICD-10-CM | POA: Diagnosis not present

## 2020-12-01 DIAGNOSIS — F5081 Binge eating disorder: Secondary | ICD-10-CM | POA: Diagnosis not present

## 2020-12-01 DIAGNOSIS — F331 Major depressive disorder, recurrent, moderate: Secondary | ICD-10-CM | POA: Diagnosis not present

## 2020-12-01 DIAGNOSIS — F4312 Post-traumatic stress disorder, chronic: Secondary | ICD-10-CM | POA: Diagnosis not present

## 2020-12-08 ENCOUNTER — Other Ambulatory Visit: Payer: Self-pay

## 2020-12-08 DIAGNOSIS — R109 Unspecified abdominal pain: Secondary | ICD-10-CM

## 2020-12-08 DIAGNOSIS — K439 Ventral hernia without obstruction or gangrene: Secondary | ICD-10-CM

## 2020-12-08 DIAGNOSIS — R198 Other specified symptoms and signs involving the digestive system and abdomen: Secondary | ICD-10-CM

## 2020-12-11 ENCOUNTER — Telehealth: Payer: Self-pay | Admitting: Gastroenterology

## 2020-12-11 NOTE — Telephone Encounter (Signed)
Tried to call the number back and it went to a voice mail. Message was left.

## 2020-12-11 NOTE — Telephone Encounter (Signed)
[facility] calling about order for CT abdomen. Plz call [phone] select option 2 then 3.. Plz advise thanks.Marland Kitchen

## 2020-12-14 DIAGNOSIS — H5213 Myopia, bilateral: Secondary | ICD-10-CM | POA: Diagnosis not present

## 2020-12-14 IMAGING — US US ABDOMEN LIMITED
1 series · 14 of 25 positions shown · non-contrast
Comparison: None.

CLINICAL DATA: Elevated liver enzymes

EXAM:
ULTRASOUND ABDOMEN LIMITED RIGHT UPPER QUADRANT

[Series 1: us abdomen limited · 14 of 95 slices shown]
[im 1/95]
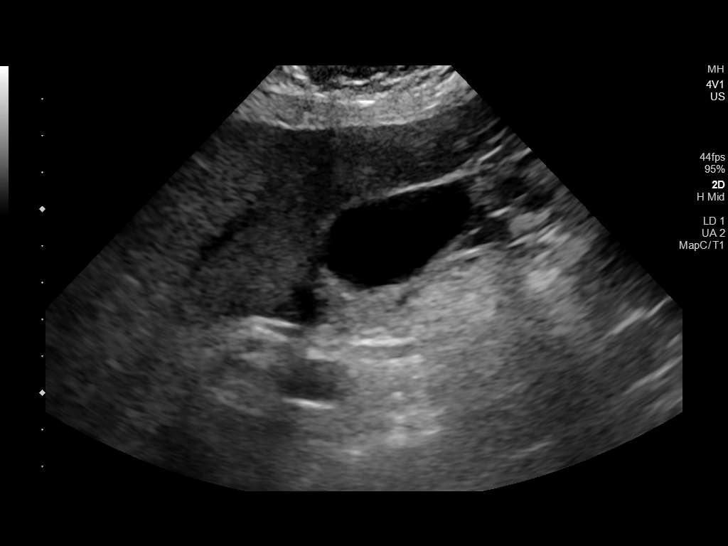
[im 8/95]
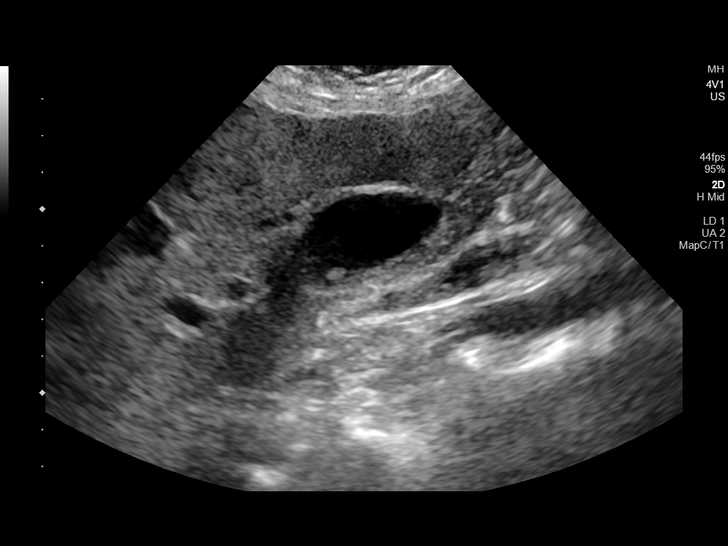
[im 16/95]
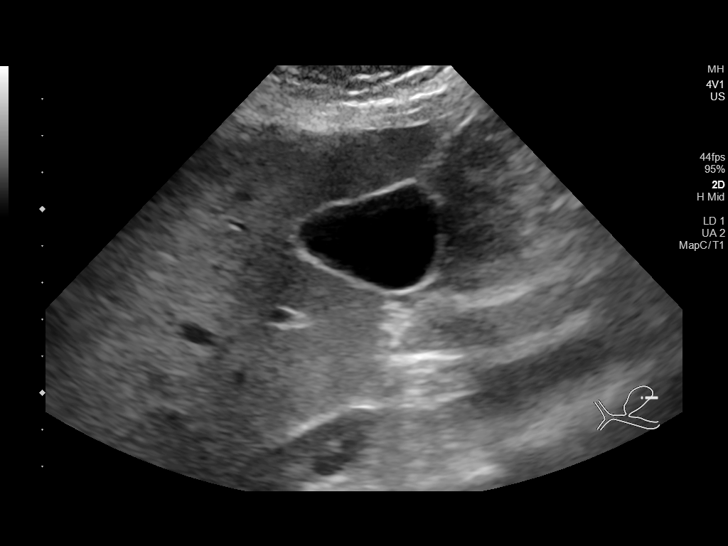
[im 24/95]
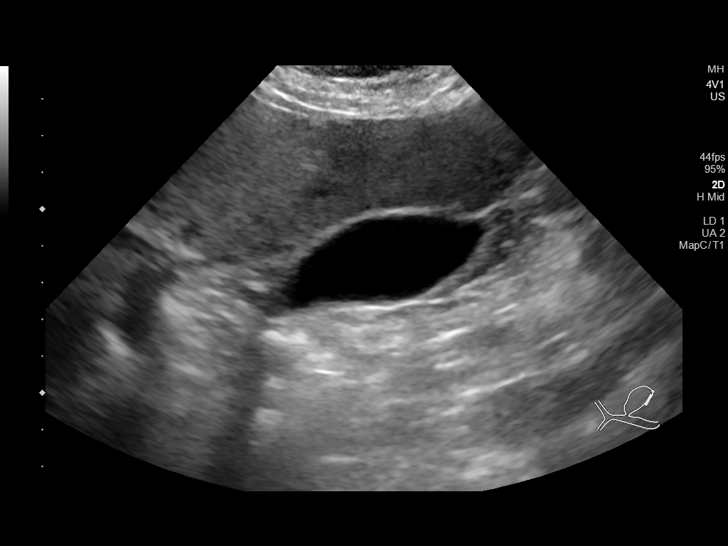
[im 32/95]
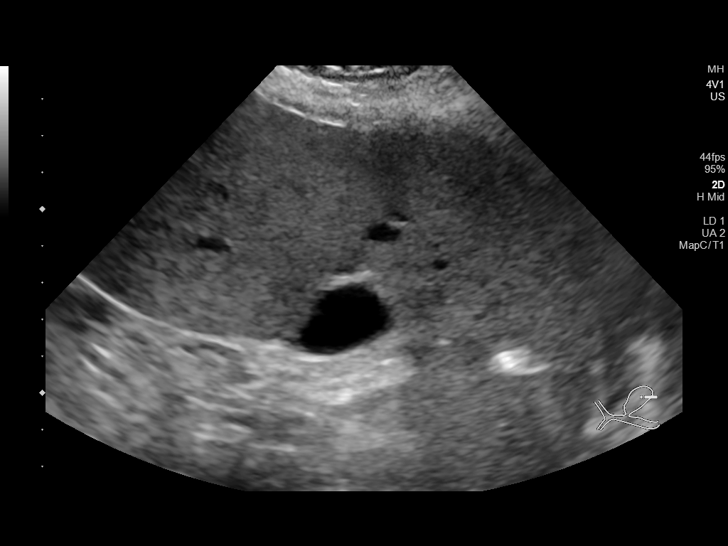
[im 36/95]
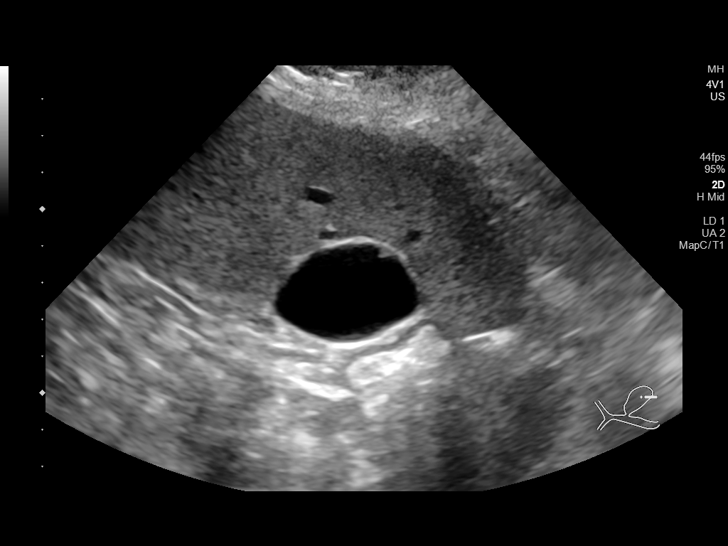
[im 44/95]
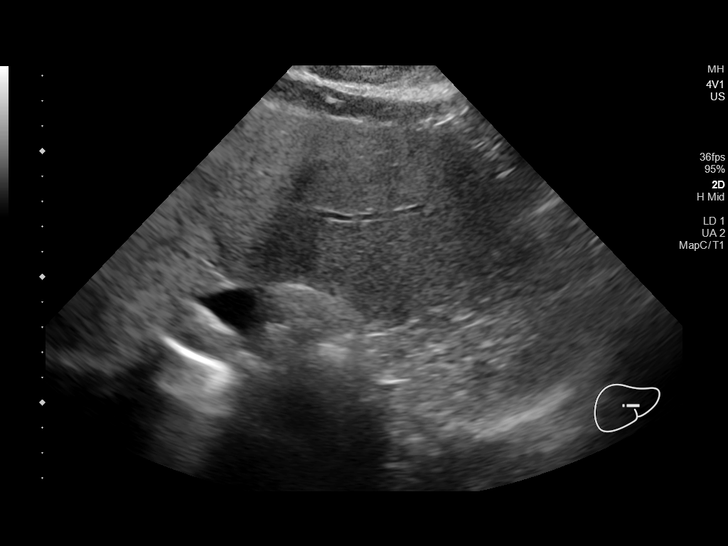
[im 51/95]
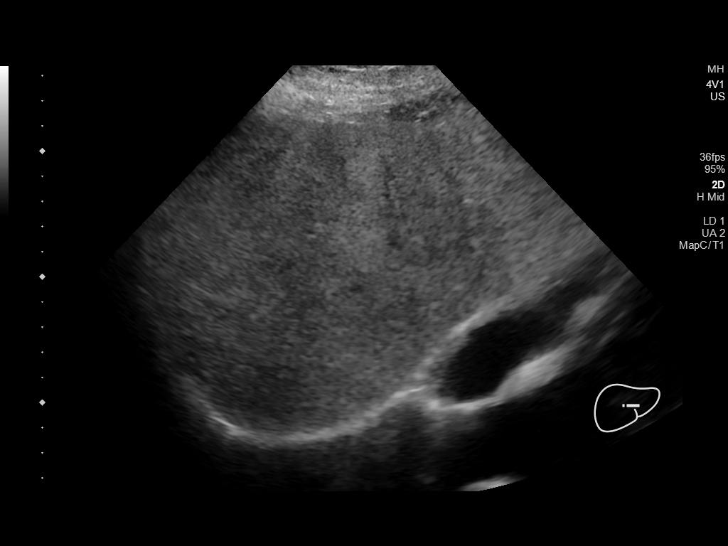
[im 59/95]
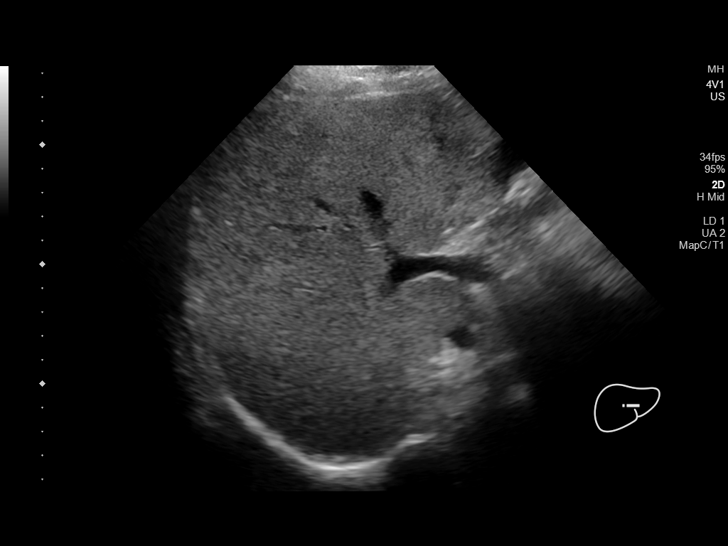
[im 63/95]
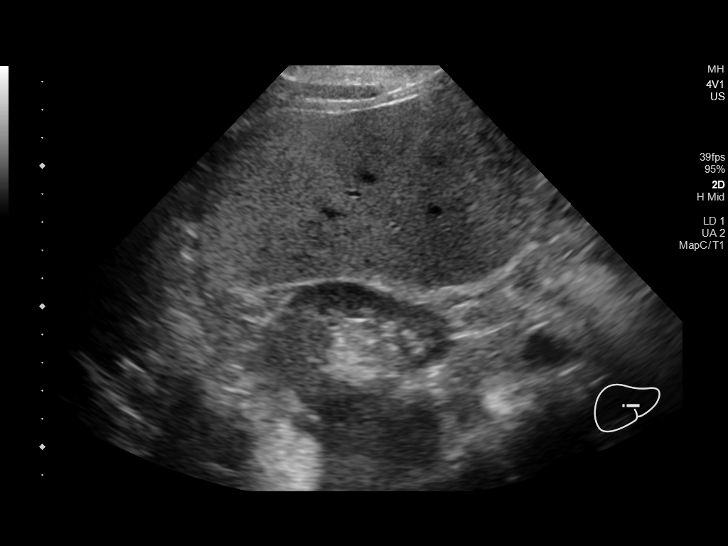
[im 71/95]
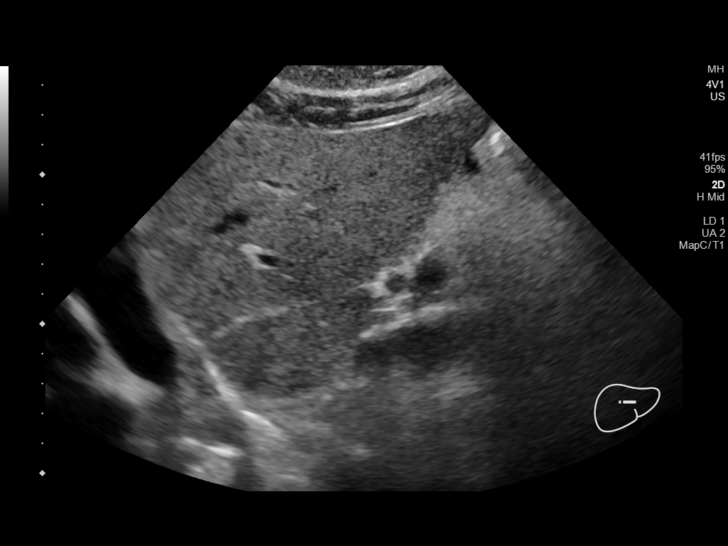
[im 79/95]
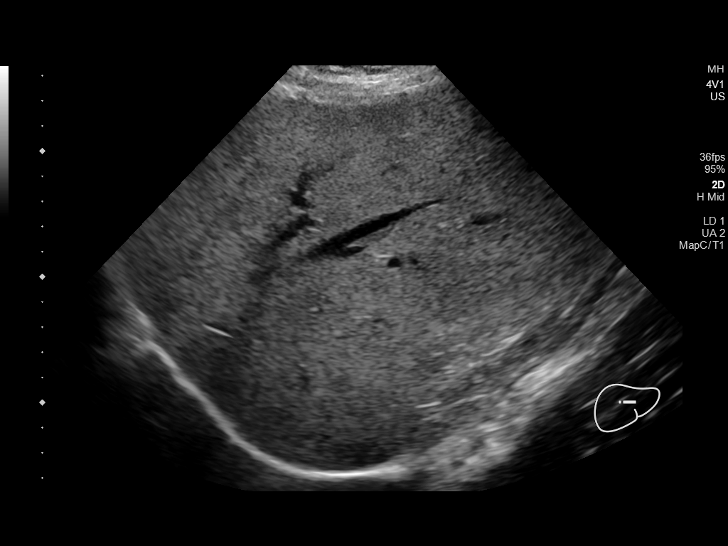
[im 87/95]
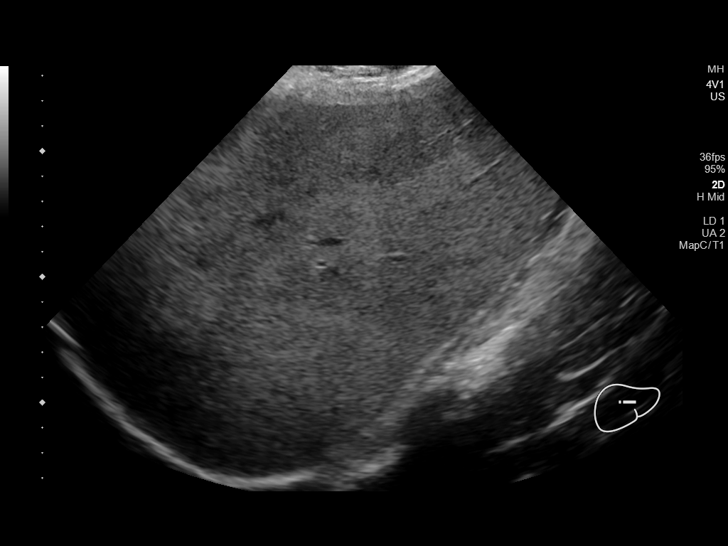
[im 95/95]
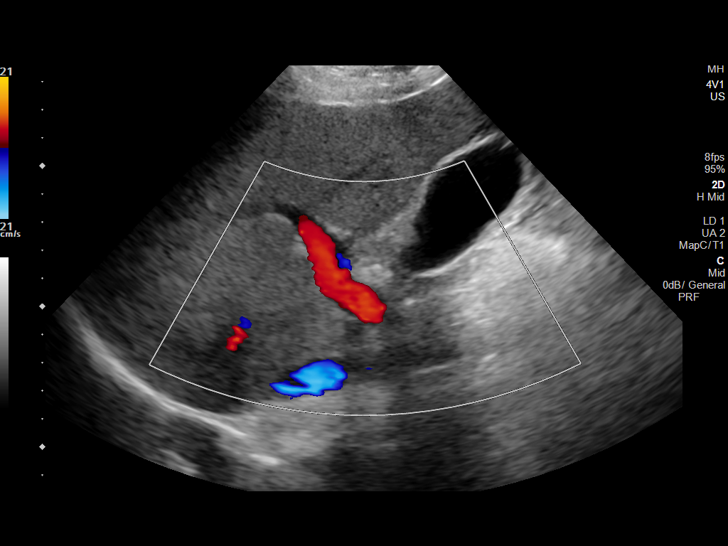

[14 of 25 positions shown; findings below may reference images not displayed]

FINDINGS: Gallbladder:

Within the gallbladder, there is a 5 mm echogenic focus which
neither moves nor shadows, a presumed polyp. There is a 2 mm polyp
present as well as a 4 mm polyp along the anterior wall. There are
no echogenic foci which move and shadow as is expected with
cholelithiasis. No gallbladder wall thickening or pericholecystic
fluid. No sonographic Murphy sign noted by sonographer.

Common bile duct:

Diameter: 4 mm. No intrahepatic or extrahepatic biliary duct
dilatation.

Liver:

No focal lesion identified. Liver echogenicity overall is increased.
Portal vein is patent on color Doppler imaging with normal direction
of blood flow towards the liver.

Other: None.
IMPRESSION: 1. Increased liver echogenicity, a finding indicative of a degree of
hepatic steatosis. No focal liver lesions evident.

2. Apparent gallbladder polyps, largest measuring 5 mm in length.
Per consensus guidelines, polyps of this size do not warrant
additional imaging surveillance. No gallstones, gallbladder wall
thickening, or pericholecystic fluid.

## 2020-12-15 ENCOUNTER — Ambulatory Visit: Payer: BC Managed Care – PPO

## 2020-12-15 ENCOUNTER — Ambulatory Visit
Admission: RE | Admit: 2020-12-15 | Discharge: 2020-12-15 | Disposition: A | Discharge status: Home / Self Care | Payer: BC Managed Care – PPO | Source: Ambulatory Visit | Attending: Family Medicine | Admitting: Family Medicine

## 2020-12-15 ENCOUNTER — Other Ambulatory Visit: Payer: Self-pay

## 2020-12-15 DIAGNOSIS — N644 Mastodynia: Secondary | ICD-10-CM

## 2020-12-15 NOTE — Telephone Encounter (Signed)
Spoke with [name] at [facility] in regards to request below. Patient has requested that we fax CT order to [facility] because it is cheaper for her to have it done there. They are located at [address] in [place], [place] [address]. [phone], [phone]:[id number]. Advised that I will fax CT order over now, Eileen Stanford will notify the patient.

## 2020-12-16 ENCOUNTER — Ambulatory Visit (HOSPITAL_COMMUNITY): Payer: BC Managed Care – PPO

## 2020-12-24 DIAGNOSIS — M4802 Spinal stenosis, cervical region: Secondary | ICD-10-CM | POA: Diagnosis not present

## 2020-12-24 DIAGNOSIS — M9902 Segmental and somatic dysfunction of thoracic region: Secondary | ICD-10-CM | POA: Diagnosis not present

## 2020-12-24 DIAGNOSIS — R519 Headache, unspecified: Secondary | ICD-10-CM | POA: Diagnosis not present

## 2020-12-24 DIAGNOSIS — M9901 Segmental and somatic dysfunction of cervical region: Secondary | ICD-10-CM | POA: Diagnosis not present

## 2020-12-28 DIAGNOSIS — Z1231 Encounter for screening mammogram for malignant neoplasm of breast: Secondary | ICD-10-CM | POA: Diagnosis not present

## 2020-12-28 DIAGNOSIS — K439 Ventral hernia without obstruction or gangrene: Secondary | ICD-10-CM | POA: Diagnosis not present

## 2020-12-28 LAB — HM MAMMOGRAPHY

## 2020-12-29 ENCOUNTER — Telehealth: Payer: Self-pay | Admitting: Gastroenterology

## 2020-12-29 NOTE — Telephone Encounter (Signed)
Brooklyn can you please let the patient know that her CT scan came back to me as outlined, done in [place]:  CT abdomen / pelvis with contrast -12/28/20 - main findings are small ventral hernia that contains some fat but no evidence of complicating features. There is nothing else of note, seems normal otherwise.  Please reassure her, no evidence of cancer or anything bad. The only way to fix the hernia is with surgery. If it is small and not causing pain routinely, etc, I would probably just monitor for now. If she feels otherwise or has a lot of symptoms from it, she would need to see a surgeon to discuss repair. Thanks

## 2020-12-29 NOTE — Telephone Encounter (Signed)
Ct result received and placed on Dr. Doyne Keel desk for review.

## 2020-12-30 NOTE — Telephone Encounter (Signed)
Spoke with patient in regards to CT results. Patient states that she has the slightest twinge of pain, less than a level 1. She will continue to monitor for now but will let us know if anything changes. She also reports that Colestid has helped a lot, she states that she does not need a refill at this time. Patient had no concerns at the end of the call.

## 2020-12-30 NOTE — Telephone Encounter (Signed)
Lm on vm for patient to return call 

## 2020-12-31 DIAGNOSIS — M4802 Spinal stenosis, cervical region: Secondary | ICD-10-CM | POA: Diagnosis not present

## 2020-12-31 DIAGNOSIS — R519 Headache, unspecified: Secondary | ICD-10-CM | POA: Diagnosis not present

## 2020-12-31 DIAGNOSIS — M9902 Segmental and somatic dysfunction of thoracic region: Secondary | ICD-10-CM | POA: Diagnosis not present

## 2020-12-31 DIAGNOSIS — M9901 Segmental and somatic dysfunction of cervical region: Secondary | ICD-10-CM | POA: Diagnosis not present

## 2021-01-01 ENCOUNTER — Other Ambulatory Visit: Payer: BC Managed Care – PPO

## 2021-01-04 DIAGNOSIS — G43719 Chronic migraine without aura, intractable, without status migrainosus: Secondary | ICD-10-CM | POA: Diagnosis not present

## 2021-01-07 DIAGNOSIS — M4802 Spinal stenosis, cervical region: Secondary | ICD-10-CM | POA: Diagnosis not present

## 2021-01-07 DIAGNOSIS — M9902 Segmental and somatic dysfunction of thoracic region: Secondary | ICD-10-CM | POA: Diagnosis not present

## 2021-01-07 DIAGNOSIS — G43719 Chronic migraine without aura, intractable, without status migrainosus: Secondary | ICD-10-CM | POA: Diagnosis not present

## 2021-01-07 DIAGNOSIS — M542 Cervicalgia: Secondary | ICD-10-CM | POA: Diagnosis not present

## 2021-01-07 DIAGNOSIS — R519 Headache, unspecified: Secondary | ICD-10-CM | POA: Diagnosis not present

## 2021-01-07 DIAGNOSIS — M9901 Segmental and somatic dysfunction of cervical region: Secondary | ICD-10-CM | POA: Diagnosis not present

## 2021-01-13 ENCOUNTER — Other Ambulatory Visit: Payer: Self-pay

## 2021-01-13 ENCOUNTER — Other Ambulatory Visit: Payer: BC Managed Care – PPO

## 2021-01-13 ENCOUNTER — Encounter: Payer: Self-pay | Admitting: Family Medicine

## 2021-01-13 ENCOUNTER — Ambulatory Visit: Payer: BC Managed Care – PPO | Admitting: Family Medicine

## 2021-01-13 VITALS — BP 132/84 | HR 87 | Temp 98.5°F | Wt 266.6 lb

## 2021-01-13 DIAGNOSIS — M9901 Segmental and somatic dysfunction of cervical region: Secondary | ICD-10-CM | POA: Diagnosis not present

## 2021-01-13 DIAGNOSIS — M4802 Spinal stenosis, cervical region: Secondary | ICD-10-CM | POA: Diagnosis not present

## 2021-01-13 DIAGNOSIS — L03011 Cellulitis of right finger: Secondary | ICD-10-CM | POA: Diagnosis not present

## 2021-01-13 DIAGNOSIS — R519 Headache, unspecified: Secondary | ICD-10-CM | POA: Diagnosis not present

## 2021-01-13 DIAGNOSIS — M9902 Segmental and somatic dysfunction of thoracic region: Secondary | ICD-10-CM | POA: Diagnosis not present

## 2021-01-13 DIAGNOSIS — M6740 Ganglion, unspecified site: Secondary | ICD-10-CM

## 2021-01-13 DIAGNOSIS — E7800 Pure hypercholesterolemia, unspecified: Secondary | ICD-10-CM

## 2021-01-13 MED ORDER — DOXYCYCLINE HYCLATE 100 MG PO TABS: 100.0000 mg | ORAL_TABLET | Freq: Two times a day (BID) | ORAL | 0 refills | Status: DC

## 2021-01-13 MED ORDER — MUPIROCIN 2 % EX OINT: 1.0000 "application " | TOPICAL_OINTMENT | Freq: Two times a day (BID) | CUTANEOUS | 0 refills | Status: DC

## 2021-01-13 NOTE — Progress Notes (Signed)
   Subjective:    Patient ID: [name], female    DOB: [birth year], 50 y.o.   MRN: [id number]  HPI Chief Complaint  Patient presents with   other    Thumb nail lt. Hand hurting and maybe infected, may have a cyst on lt. Hand,    Here with complaints of a red, swollen and painful right thumb tip on the medial side of her thumb nail for the past 7 days. She recall cutting her nail and it broke off and was cut into the quick.  No other nail issues.   Non tender, non painful mass on the base of her right 4rd finger. Does not want to do anything about this currently.   No fever, chills, N/V.   Also here to get her fasting lipid panel. Has not been eating a healthy diet but has been taking statin daily.   Review of Systems Pertinent positives and negatives in the history of present illness.     Objective:   Physical Exam Constitutional:      General: She is not in acute distress.    Appearance: She is not ill-appearing.  Musculoskeletal:     Right hand: Tenderness present. Normal range of motion. Normal strength. Normal sensation. Normal capillary refill. Normal pulse.     Comments: Erythema, mild edema, tenderness to palpation along the medial aspect of her right thumb nail. No fluctuance or abscess.  Small, round, moveable, non tender mass consistent with a ganglion cyst on the palmar aspect inferior to the 3rd MCP joint of the right hand.   Neurological:     Mental Status: She is alert.   BP 132/84   Pulse 87   Temp 98.5 F (36.9 C)   Wt 266 lb 9.6 oz (120.9 kg)   BMI 43.69 kg/m         Assessment & Plan:  Paronychia of right thumb - Plan: doxycycline (VIBRA-TABS) 100 MG tablet, mupirocin ointment (BACTROBAN) 2 %  Ganglion cyst  The area is not fluctuant, no abscess so we will continue with soaks 2-3 times daily at home as well as applying mupirocin topically.  I will also prescribe doxycycline due to her allergies.  She will follow-up if worsening or not  significantly improved after completing the antibiotic. We will do watchful waiting with a ganglion cyst for now.  She will let me know if it becomes bothersome.

## 2021-01-14 ENCOUNTER — Encounter: Payer: Self-pay | Admitting: Family Medicine

## 2021-01-14 ENCOUNTER — Other Ambulatory Visit: Payer: Self-pay | Admitting: Family Medicine

## 2021-01-14 LAB — LIPID PANEL
Chol/HDL Ratio: 5.1 ratio — ABNORMAL HIGH (ref 0.0–4.4)
Cholesterol, Total: 216 mg/dL — ABNORMAL HIGH (ref 100–199)
HDL: 42 mg/dL (ref >39)
LDL Chol Calc (NIH): 125 mg/dL — ABNORMAL HIGH (ref 0–99)
Triglycerides: 276 mg/dL — ABNORMAL HIGH (ref 0–149)
VLDL Cholesterol Cal: 49 mg/dL — ABNORMAL HIGH (ref 5–40)

## 2021-01-14 MED ORDER — ATORVASTATIN CALCIUM 20 MG PO TABS: 20.0000 mg | ORAL_TABLET | Freq: Every day | ORAL | 1 refills | Status: DC

## 2021-01-18 ENCOUNTER — Other Ambulatory Visit: Payer: Self-pay | Admitting: *Deleted

## 2021-01-18 MED ORDER — COLESTIPOL HCL 1 G PO TABS: 1.0000 g | ORAL_TABLET | Freq: Two times a day (BID) | ORAL | 2 refills | Status: DC

## 2021-01-19 DIAGNOSIS — G4733 Obstructive sleep apnea (adult) (pediatric): Secondary | ICD-10-CM | POA: Diagnosis not present

## 2021-01-19 DIAGNOSIS — G4710 Hypersomnia, unspecified: Secondary | ICD-10-CM | POA: Diagnosis not present

## 2021-01-20 DIAGNOSIS — F4312 Post-traumatic stress disorder, chronic: Secondary | ICD-10-CM | POA: Diagnosis not present

## 2021-01-20 DIAGNOSIS — F331 Major depressive disorder, recurrent, moderate: Secondary | ICD-10-CM | POA: Diagnosis not present

## 2021-01-20 DIAGNOSIS — F5081 Binge eating disorder: Secondary | ICD-10-CM | POA: Diagnosis not present

## 2021-01-20 DIAGNOSIS — F411 Generalized anxiety disorder: Secondary | ICD-10-CM | POA: Diagnosis not present

## 2021-01-27 DIAGNOSIS — M4802 Spinal stenosis, cervical region: Secondary | ICD-10-CM | POA: Diagnosis not present

## 2021-01-27 DIAGNOSIS — M9902 Segmental and somatic dysfunction of thoracic region: Secondary | ICD-10-CM | POA: Diagnosis not present

## 2021-01-27 DIAGNOSIS — M9901 Segmental and somatic dysfunction of cervical region: Secondary | ICD-10-CM | POA: Diagnosis not present

## 2021-01-27 DIAGNOSIS — R519 Headache, unspecified: Secondary | ICD-10-CM | POA: Diagnosis not present

## 2021-03-08 DIAGNOSIS — F411 Generalized anxiety disorder: Secondary | ICD-10-CM | POA: Diagnosis not present

## 2021-03-08 DIAGNOSIS — F3162 Bipolar disorder, current episode mixed, moderate: Secondary | ICD-10-CM | POA: Diagnosis not present

## 2021-03-08 DIAGNOSIS — F4312 Post-traumatic stress disorder, chronic: Secondary | ICD-10-CM | POA: Diagnosis not present

## 2021-03-08 DIAGNOSIS — F5081 Binge eating disorder: Secondary | ICD-10-CM | POA: Diagnosis not present

## 2021-03-16 DIAGNOSIS — R519 Headache, unspecified: Secondary | ICD-10-CM | POA: Diagnosis not present

## 2021-03-16 DIAGNOSIS — M9902 Segmental and somatic dysfunction of thoracic region: Secondary | ICD-10-CM | POA: Diagnosis not present

## 2021-03-16 DIAGNOSIS — M9901 Segmental and somatic dysfunction of cervical region: Secondary | ICD-10-CM | POA: Diagnosis not present

## 2021-03-16 DIAGNOSIS — M4802 Spinal stenosis, cervical region: Secondary | ICD-10-CM | POA: Diagnosis not present

## 2021-03-23 DIAGNOSIS — M9902 Segmental and somatic dysfunction of thoracic region: Secondary | ICD-10-CM | POA: Diagnosis not present

## 2021-03-23 DIAGNOSIS — R519 Headache, unspecified: Secondary | ICD-10-CM | POA: Diagnosis not present

## 2021-03-23 DIAGNOSIS — M4802 Spinal stenosis, cervical region: Secondary | ICD-10-CM | POA: Diagnosis not present

## 2021-03-23 DIAGNOSIS — M9901 Segmental and somatic dysfunction of cervical region: Secondary | ICD-10-CM | POA: Diagnosis not present

## 2021-03-24 DIAGNOSIS — G43719 Chronic migraine without aura, intractable, without status migrainosus: Secondary | ICD-10-CM | POA: Diagnosis not present

## 2021-03-25 DIAGNOSIS — F3162 Bipolar disorder, current episode mixed, moderate: Secondary | ICD-10-CM | POA: Diagnosis not present

## 2021-03-25 DIAGNOSIS — F5081 Binge eating disorder: Secondary | ICD-10-CM | POA: Diagnosis not present

## 2021-03-25 DIAGNOSIS — F4312 Post-traumatic stress disorder, chronic: Secondary | ICD-10-CM | POA: Diagnosis not present

## 2021-03-25 DIAGNOSIS — F411 Generalized anxiety disorder: Secondary | ICD-10-CM | POA: Diagnosis not present

## 2021-03-30 DIAGNOSIS — M4802 Spinal stenosis, cervical region: Secondary | ICD-10-CM | POA: Diagnosis not present

## 2021-03-30 DIAGNOSIS — R519 Headache, unspecified: Secondary | ICD-10-CM | POA: Diagnosis not present

## 2021-03-30 DIAGNOSIS — M9901 Segmental and somatic dysfunction of cervical region: Secondary | ICD-10-CM | POA: Diagnosis not present

## 2021-03-30 DIAGNOSIS — M9902 Segmental and somatic dysfunction of thoracic region: Secondary | ICD-10-CM | POA: Diagnosis not present

## 2021-04-14 DIAGNOSIS — G43719 Chronic migraine without aura, intractable, without status migrainosus: Secondary | ICD-10-CM | POA: Diagnosis not present

## 2021-04-14 DIAGNOSIS — M542 Cervicalgia: Secondary | ICD-10-CM | POA: Diagnosis not present

## 2021-04-15 DIAGNOSIS — F411 Generalized anxiety disorder: Secondary | ICD-10-CM | POA: Diagnosis not present

## 2021-04-15 DIAGNOSIS — F5081 Binge eating disorder: Secondary | ICD-10-CM | POA: Diagnosis not present

## 2021-04-15 DIAGNOSIS — F4312 Post-traumatic stress disorder, chronic: Secondary | ICD-10-CM | POA: Diagnosis not present

## 2021-04-15 DIAGNOSIS — F3162 Bipolar disorder, current episode mixed, moderate: Secondary | ICD-10-CM | POA: Diagnosis not present

## 2021-04-19 DIAGNOSIS — G4710 Hypersomnia, unspecified: Secondary | ICD-10-CM | POA: Diagnosis not present

## 2021-04-19 DIAGNOSIS — G4733 Obstructive sleep apnea (adult) (pediatric): Secondary | ICD-10-CM | POA: Diagnosis not present

## 2021-05-03 ENCOUNTER — Other Ambulatory Visit: Payer: Self-pay

## 2021-05-03 MED ORDER — AMLODIPINE BESYLATE 5 MG PO TABS: ORAL_TABLET | ORAL | 0 refills | Status: DC

## 2021-05-18 DIAGNOSIS — F4312 Post-traumatic stress disorder, chronic: Secondary | ICD-10-CM | POA: Diagnosis not present

## 2021-05-18 DIAGNOSIS — F3162 Bipolar disorder, current episode mixed, moderate: Secondary | ICD-10-CM | POA: Diagnosis not present

## 2021-05-18 DIAGNOSIS — F411 Generalized anxiety disorder: Secondary | ICD-10-CM | POA: Diagnosis not present

## 2021-05-18 DIAGNOSIS — F5081 Binge eating disorder: Secondary | ICD-10-CM | POA: Diagnosis not present

## 2021-05-20 DIAGNOSIS — R519 Headache, unspecified: Secondary | ICD-10-CM | POA: Diagnosis not present

## 2021-05-20 DIAGNOSIS — M9901 Segmental and somatic dysfunction of cervical region: Secondary | ICD-10-CM | POA: Diagnosis not present

## 2021-05-20 DIAGNOSIS — M4802 Spinal stenosis, cervical region: Secondary | ICD-10-CM | POA: Diagnosis not present

## 2021-05-20 DIAGNOSIS — M9902 Segmental and somatic dysfunction of thoracic region: Secondary | ICD-10-CM | POA: Diagnosis not present

## 2021-05-26 ENCOUNTER — Other Ambulatory Visit: Payer: Self-pay

## 2021-05-26 DIAGNOSIS — M9901 Segmental and somatic dysfunction of cervical region: Secondary | ICD-10-CM | POA: Diagnosis not present

## 2021-05-26 DIAGNOSIS — R519 Headache, unspecified: Secondary | ICD-10-CM | POA: Diagnosis not present

## 2021-05-26 DIAGNOSIS — M4802 Spinal stenosis, cervical region: Secondary | ICD-10-CM | POA: Diagnosis not present

## 2021-05-26 DIAGNOSIS — M9902 Segmental and somatic dysfunction of thoracic region: Secondary | ICD-10-CM | POA: Diagnosis not present

## 2021-05-26 MED ORDER — COLESTIPOL HCL 1 G PO TABS: 1.0000 g | ORAL_TABLET | Freq: Two times a day (BID) | ORAL | 2 refills | Status: DC

## 2021-06-09 DIAGNOSIS — R519 Headache, unspecified: Secondary | ICD-10-CM | POA: Diagnosis not present

## 2021-06-09 DIAGNOSIS — M9902 Segmental and somatic dysfunction of thoracic region: Secondary | ICD-10-CM | POA: Diagnosis not present

## 2021-06-09 DIAGNOSIS — M9901 Segmental and somatic dysfunction of cervical region: Secondary | ICD-10-CM | POA: Diagnosis not present

## 2021-06-09 DIAGNOSIS — M4802 Spinal stenosis, cervical region: Secondary | ICD-10-CM | POA: Diagnosis not present

## 2021-06-16 DIAGNOSIS — M4802 Spinal stenosis, cervical region: Secondary | ICD-10-CM | POA: Diagnosis not present

## 2021-06-16 DIAGNOSIS — M9901 Segmental and somatic dysfunction of cervical region: Secondary | ICD-10-CM | POA: Diagnosis not present

## 2021-06-16 DIAGNOSIS — M9902 Segmental and somatic dysfunction of thoracic region: Secondary | ICD-10-CM | POA: Diagnosis not present

## 2021-06-16 DIAGNOSIS — R519 Headache, unspecified: Secondary | ICD-10-CM | POA: Diagnosis not present

## 2021-06-18 DIAGNOSIS — F3162 Bipolar disorder, current episode mixed, moderate: Secondary | ICD-10-CM | POA: Diagnosis not present

## 2021-06-21 DIAGNOSIS — F3162 Bipolar disorder, current episode mixed, moderate: Secondary | ICD-10-CM | POA: Diagnosis not present

## 2021-06-22 DIAGNOSIS — F3162 Bipolar disorder, current episode mixed, moderate: Secondary | ICD-10-CM | POA: Diagnosis not present

## 2021-06-23 DIAGNOSIS — F3162 Bipolar disorder, current episode mixed, moderate: Secondary | ICD-10-CM | POA: Diagnosis not present

## 2021-06-24 DIAGNOSIS — F3162 Bipolar disorder, current episode mixed, moderate: Secondary | ICD-10-CM | POA: Diagnosis not present

## 2021-06-25 DIAGNOSIS — F3162 Bipolar disorder, current episode mixed, moderate: Secondary | ICD-10-CM | POA: Diagnosis not present

## 2021-06-28 DIAGNOSIS — F3162 Bipolar disorder, current episode mixed, moderate: Secondary | ICD-10-CM | POA: Diagnosis not present

## 2021-06-29 DIAGNOSIS — F3162 Bipolar disorder, current episode mixed, moderate: Secondary | ICD-10-CM | POA: Diagnosis not present

## 2021-06-30 DIAGNOSIS — F3162 Bipolar disorder, current episode mixed, moderate: Secondary | ICD-10-CM | POA: Diagnosis not present

## 2021-07-01 DIAGNOSIS — F3162 Bipolar disorder, current episode mixed, moderate: Secondary | ICD-10-CM | POA: Diagnosis not present

## 2021-07-02 DIAGNOSIS — F3162 Bipolar disorder, current episode mixed, moderate: Secondary | ICD-10-CM | POA: Diagnosis not present

## 2021-07-05 DIAGNOSIS — F3162 Bipolar disorder, current episode mixed, moderate: Secondary | ICD-10-CM | POA: Diagnosis not present

## 2021-07-06 DIAGNOSIS — F3162 Bipolar disorder, current episode mixed, moderate: Secondary | ICD-10-CM | POA: Diagnosis not present

## 2021-07-07 DIAGNOSIS — F3162 Bipolar disorder, current episode mixed, moderate: Secondary | ICD-10-CM | POA: Diagnosis not present

## 2021-07-08 DIAGNOSIS — F3162 Bipolar disorder, current episode mixed, moderate: Secondary | ICD-10-CM | POA: Diagnosis not present

## 2021-07-09 DIAGNOSIS — F3162 Bipolar disorder, current episode mixed, moderate: Secondary | ICD-10-CM | POA: Diagnosis not present

## 2021-07-13 DIAGNOSIS — G43719 Chronic migraine without aura, intractable, without status migrainosus: Secondary | ICD-10-CM | POA: Diagnosis not present

## 2021-07-13 DIAGNOSIS — F3162 Bipolar disorder, current episode mixed, moderate: Secondary | ICD-10-CM | POA: Diagnosis not present

## 2021-07-14 DIAGNOSIS — F3162 Bipolar disorder, current episode mixed, moderate: Secondary | ICD-10-CM | POA: Diagnosis not present

## 2021-07-15 DIAGNOSIS — F3162 Bipolar disorder, current episode mixed, moderate: Secondary | ICD-10-CM | POA: Diagnosis not present

## 2021-07-16 DIAGNOSIS — F3162 Bipolar disorder, current episode mixed, moderate: Secondary | ICD-10-CM | POA: Diagnosis not present

## 2021-07-19 DIAGNOSIS — F3162 Bipolar disorder, current episode mixed, moderate: Secondary | ICD-10-CM | POA: Diagnosis not present

## 2021-07-20 ENCOUNTER — Other Ambulatory Visit: Payer: Self-pay

## 2021-07-20 DIAGNOSIS — F3162 Bipolar disorder, current episode mixed, moderate: Secondary | ICD-10-CM | POA: Diagnosis not present

## 2021-07-20 MED ORDER — ATORVASTATIN CALCIUM 20 MG PO TABS: 20.0000 mg | ORAL_TABLET | Freq: Every day | ORAL | 0 refills | Status: DC

## 2021-07-21 DIAGNOSIS — F3162 Bipolar disorder, current episode mixed, moderate: Secondary | ICD-10-CM | POA: Diagnosis not present

## 2021-07-22 DIAGNOSIS — F3162 Bipolar disorder, current episode mixed, moderate: Secondary | ICD-10-CM | POA: Diagnosis not present

## 2021-07-23 DIAGNOSIS — F3162 Bipolar disorder, current episode mixed, moderate: Secondary | ICD-10-CM | POA: Diagnosis not present

## 2021-07-26 DIAGNOSIS — F3162 Bipolar disorder, current episode mixed, moderate: Secondary | ICD-10-CM | POA: Diagnosis not present

## 2021-07-27 DIAGNOSIS — F3162 Bipolar disorder, current episode mixed, moderate: Secondary | ICD-10-CM | POA: Diagnosis not present

## 2021-07-27 DIAGNOSIS — G4710 Hypersomnia, unspecified: Secondary | ICD-10-CM | POA: Diagnosis not present

## 2021-07-27 DIAGNOSIS — G4733 Obstructive sleep apnea (adult) (pediatric): Secondary | ICD-10-CM | POA: Diagnosis not present

## 2021-07-28 DIAGNOSIS — F3162 Bipolar disorder, current episode mixed, moderate: Secondary | ICD-10-CM | POA: Diagnosis not present

## 2021-07-30 ENCOUNTER — Other Ambulatory Visit: Payer: Self-pay | Admitting: Medical

## 2021-07-30 DIAGNOSIS — F3162 Bipolar disorder, current episode mixed, moderate: Secondary | ICD-10-CM | POA: Diagnosis not present

## 2021-08-02 DIAGNOSIS — F3162 Bipolar disorder, current episode mixed, moderate: Secondary | ICD-10-CM | POA: Diagnosis not present

## 2021-08-03 DIAGNOSIS — F3162 Bipolar disorder, current episode mixed, moderate: Secondary | ICD-10-CM | POA: Diagnosis not present

## 2021-08-04 DIAGNOSIS — F3162 Bipolar disorder, current episode mixed, moderate: Secondary | ICD-10-CM | POA: Diagnosis not present

## 2021-08-05 DIAGNOSIS — F3162 Bipolar disorder, current episode mixed, moderate: Secondary | ICD-10-CM | POA: Diagnosis not present

## 2021-08-06 DIAGNOSIS — F3162 Bipolar disorder, current episode mixed, moderate: Secondary | ICD-10-CM | POA: Diagnosis not present

## 2021-08-09 DIAGNOSIS — F3162 Bipolar disorder, current episode mixed, moderate: Secondary | ICD-10-CM | POA: Diagnosis not present

## 2021-08-10 DIAGNOSIS — F3162 Bipolar disorder, current episode mixed, moderate: Secondary | ICD-10-CM | POA: Diagnosis not present

## 2021-08-11 DIAGNOSIS — F3162 Bipolar disorder, current episode mixed, moderate: Secondary | ICD-10-CM | POA: Diagnosis not present

## 2021-08-12 DIAGNOSIS — F3162 Bipolar disorder, current episode mixed, moderate: Secondary | ICD-10-CM | POA: Diagnosis not present

## 2021-08-16 DIAGNOSIS — F3162 Bipolar disorder, current episode mixed, moderate: Secondary | ICD-10-CM | POA: Diagnosis not present

## 2021-08-17 DIAGNOSIS — F3162 Bipolar disorder, current episode mixed, moderate: Secondary | ICD-10-CM | POA: Diagnosis not present

## 2021-08-26 DIAGNOSIS — G43719 Chronic migraine without aura, intractable, without status migrainosus: Secondary | ICD-10-CM | POA: Diagnosis not present

## 2021-08-26 DIAGNOSIS — M542 Cervicalgia: Secondary | ICD-10-CM | POA: Diagnosis not present

## 2021-08-30 NOTE — Progress Notes (Signed)
? ?Established Patient Office Visit ? ?Subjective:  ?Patient ID: [name], female    DOB: [birth year]  Age: 51 y.o. MRN: [id number] ? ?CC:  ?Chief Complaint  ?Patient presents with  ? Medication Refill  ?  Med check- she also has a form that needs to be completed for a job. Left ear seems stopped up and she also has a spot on the left said of her face that's bothering her.  ? ? 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 ? ?Outpatient Medications Prior to Visit  ?Medication Sig Dispense Refill  ? AZO-CRANBERRY PO Take 2 tablets by mouth daily.    ? chlorproMAZINE (THORAZINE) 25 MG tablet Take 25 mg by mouth daily as needed (migraine headaches).    ? colestipol (COLESTID) 1 g tablet Take 1-2 tablets (1-2 g total) by mouth 2 (two) times daily. 120 tablet 2  ? famotidine (PEPCID) 10 MG tablet Take 1-2 tablets (10-20 mg total) by mouth daily. May increase to twice daily as needed    ? lamoTRIgine (LAMICTAL) 200 MG tablet Take 200 mg by mouth daily.    ? lamoTRIgine (LAMICTAL) 25 MG CHEW chewable tablet Chew 25 mg by mouth daily.    ? Loperamide HCl (IMODIUM A-D PO) Take 1 tablet by mouth as needed.    ? Multiple Vitamin (MULTIVITAMIN WITH MINERALS) TABS tablet Take 1 tablet by mouth daily.    ? Polyethyl Glycol-Propyl Glycol (SYSTANE OP) Apply 1 drop to eye daily as needed (dry/irritated eyes).    ? Vitamin D, Ergocalciferol, (DRISDOL) 1.25 MG (50000 UNIT) CAPS capsule Take 1 capsule (50,000 Units total) by mouth every 7 (seven) days. 4 capsule 0  ? amLODipine (NORVASC) 5 MG  tablet TAKE 1 TABLET(5 MG) BY MOUTH DAILY 90 tablet 0  ? atorvastatin (LIPITOR) 10 MG tablet     ? atorvastatin (LIPITOR) 20 MG tablet Take 1 tablet (20 mg total) by mouth daily. 90 tablet 0  ? Vilazodone HCl (VIIBRYD PO) Take 60 mg by mouth.    ? ADDERALL 20 MG tablet Take 20 mg by mouth 2 (two) times daily.    ? hydrOXYzine (ATARAX) 25 MG tablet Take 25 mg by mouth daily.    ? Omega-3 Fatty Acids (FISH OIL) 1000 MG CAPS Take 1,000 tablets by mouth daily.  (Patient not taking: Reported on 08/31/2021)    ? rizatriptan (MAXALT) 10 MG tablet Take by mouth.    ? Vilazodone HCl (VIIBRYD) 40 MG TABS Take 40 mg by mouth daily.    ? acetaminophen (TYLENOL) 500 MG tablet Take 500 mg by mouth every 6 (six) hours as needed.    ? amLODipine (NORVASC) 10 MG tablet Take by mouth.    ? doxycycline (VIBRA-TABS) 100 MG tablet Take 1 tablet (100 mg total) by mouth 2 (two) times daily. 20 tablet 0  ? lamoTRIgine (LAMICTAL) 100 MG tablet Take by mouth.    ? lamoTRIgine (LAMICTAL) 25 MG tablet Take by mouth.    ? methylphenidate 27 MG PO CR tablet Take 27 mg  by mouth every morning.    ? mupirocin ointment (BACTROBAN) 2 % Apply 1 application topically 2 (two) times daily. 22 g 0  ? ?No facility-administered medications prior to visit.  ? ? ?Allergies  ?Allergen Reactions  ? Amoxicillin Rash  ?  Has patient had a PCN reaction causing immediate rash, facial/tongue/throat swelling, SOB or lightheadedness with hypotension: Unknown ?Has patient had a PCN reaction causing severe rash involving mucus membranes or skin necrosis: Yes ?Has patient had a PCN reaction that required hospitalization: No ?Has patient had a PCN reaction occurring within the last 10 years: No ?If all of the above answers are "NO", then may proceed with Cephalosporin use. ?  ? Penicillins Rash  ?  Has patient had a PCN reaction causing immediate rash, facial/tongue/throat swelling, SOB or lightheadedness with hypotension: Unknown ?Has patient had a PCN reaction causing  severe rash involving mucus membranes or skin necrosis: Yes ?Has patient had a PCN reaction that required hospitalization: No ?Has patient had a PCN reaction occurring within the last 10 years: No ?If all of the above answers are "NO", then may proceed with Cephalosporin use. ?  ? Sulfa Antibiotics Rash  ? ? ?ROS ?Review of Systems  ?Constitutional:  Negative for activity change and chills.  ?HENT:  Negative for congestion, ear pain, hearing loss and voice change.   ?Eyes:  Negative for pain and redness.  ?Respiratory:  Negative for cough and wheezing.   ?Cardiovascular:  Negative for chest pain.  ?Gastrointestinal:  Negative for constipation, diarrhea, nausea and vomiting.  ?Endocrine: Negative for polyuria.  ?Genitourinary:  Negative for frequency.  ?Skin:  Negative for color change and rash.  ?Allergic/Immunologic: Negative for immunocompromised state.  ?Neurological:  Negative for dizziness.  ?Psychiatric/Behavioral:  Negative for agitation.   ? ?  ?Objective:  ?  ?Physical Exam ?Vitals and nursing note reviewed.  ?Constitutional:   ?   General: She is not in acute distress. ?   Appearance: She is normal weight. She is not ill-appearing.  ?HENT:  ?   Head: Normocephalic and atraumatic.  ?   Right Ear: External ear normal. There is impacted cerumen.  ?   Left Ear: External ear normal. There is impacted cerumen.  ?Eyes:  ?   Extraocular Movements: Extraocular movements intact.  ?   Conjunctiva/sclera: Conjunctivae normal.  ?   Pupils: Pupils are equal, round, and reactive to light.  ?Cardiovascular:  ?   Rate and Rhythm: Normal rate and regular rhythm.  ?Pulmonary:  ?   Effort: Pulmonary effort is normal.  ?   Breath sounds: Normal breath sounds. No wheezing.  ?Abdominal:  ?   General: Bowel sounds are normal.  ?   Palpations: Abdomen is soft.  ?Musculoskeletal:     ?   General: Normal range of motion.  ?   Cervical back: Normal range of motion.  ?Skin: ?   General: Skin is warm and dry.  ?Neurological:  ?    Mental Status: She is alert and oriented to person, place, and time.  ?Psychiatric:     ?   Mood and Affect: Mood normal.  ? ? ?BP 138/88   Pulse 83   Ht 5' 5.5" (1.664 m)   Wt 277 lb (125.6 kg)   SpO2 96%   BMI 45.39 kg/m?  ? ?Wt Readings from Last 3 Encounters:  ?08/31/21 277 lb (125.6 kg)  ?01/13/21 266 lb 9.6 oz (120.9 kg)  ?10/15/20 266 lb 6.4 oz (120.8 kg)  ? ?Name  Date  ?Hepb-cpg 09/16/2019 , 08/12/2019  ?Influenza Split 04/01/2010  ?Influenza,inj,Quad PF,6+ Mos 02/14/2015  ?Influenza,inj,Quad PF,6-35 Mos 02/05/2018  ?Influenza,inj,quad, With Preservative 02/21/2013  ?Tdap 03/08/2012 , 04/28/2010  ? ? ?Results for orders placed or performed in visit on 01/13/21  ?Lipid Panel  ?Result Value Ref Range  ? Cholesterol, Total 216 (H) 100 - 199 mg/dL  ? Triglycerides 276 (H) 0 - 149 mg/dL  ? HDL 42 >39 mg/dL  ? VLDL Cholesterol Cal 49 (H) 5 - 40 mg/dL  ? LDL Chol Calc (NIH) 125 (H) 0 - 99 mg/dL  ? Chol/HDL Ratio 5.1 (H) 0.0 - 4.4 ratio  ?  ? ? ?Last CBC ?Lab Results  ?Component Value Date  ? WBC 8.0 10/16/2020  ? HGB 14.0 10/16/2020  ? HCT 41.4 10/16/2020  ? MCV 90 10/16/2020  ? MCH 30.4 10/16/2020  ? RDW 13.1 10/16/2020  ? PLT 303 10/16/2020  ? ?Last metabolic panel ?Lab Results  ?Component Value Date  ? GLUCOSE 98 10/16/2020  ? NA 139 10/16/2020  ? K 4.5 10/16/2020  ? CL 99 10/16/2020  ? CO2 24 10/16/2020  ? BUN 13 10/16/2020  ? CREATININE 0.73 10/16/2020  ? EGFR 100 10/16/2020  ? CALCIUM 9.0 10/16/2020  ? PROT 7.0 10/16/2020  ? ALBUMIN 4.2 10/16/2020  ? LABGLOB 2.8 10/16/2020  ? AGRATIO 1.5 10/16/2020  ? BILITOT 1.0 10/16/2020  ? ALKPHOS 110 10/16/2020  ? AST 24 10/16/2020  ? ALT 39 (H) 10/16/2020  ? ANIONGAP 14 05/01/2019  ? ?Last lipids ?Lab Results  ?Component Value Date  ? CHOL 216 (H) 01/13/2021  ? HDL 42 01/13/2021  ? LDLCALC 125 (H) 01/13/2021  ? TRIG 276 (H) 01/13/2021  ? CHOLHDL 5.1 (H) 01/13/2021  ? ?Last hemoglobin A1c ?Lab Results  ?Component Value Date  ? HGBA1C 5.8 (H) 10/16/2020  ? ?Last thyroid  functions ?Lab Results  ?Component Value Date  ? TSH 1.860 10/16/2020  ? T3TOTAL 153 10/16/2020  ? ?Last vitamin D ?Lab Results  ?Component Value Date  ? VD25OH 34.2 10/16/2020  ? ?Last vitamin B12 and Folate ?Lab R

## 2021-08-31 ENCOUNTER — Ambulatory Visit (INDEPENDENT_AMBULATORY_CARE_PROVIDER_SITE_OTHER): Payer: BC Managed Care – PPO | Admitting: Physician Assistant

## 2021-08-31 ENCOUNTER — Encounter: Payer: Self-pay | Admitting: Physician Assistant

## 2021-08-31 VITALS — BP 138/88 | HR 83 | Ht 65.5 in | Wt 277.0 lb

## 2021-08-31 DIAGNOSIS — Z6841 Body Mass Index (BMI) 40.0 and over, adult: Secondary | ICD-10-CM

## 2021-08-31 DIAGNOSIS — H6123 Impacted cerumen, bilateral: Secondary | ICD-10-CM

## 2021-08-31 DIAGNOSIS — I10 Essential (primary) hypertension: Secondary | ICD-10-CM

## 2021-08-31 DIAGNOSIS — Z021 Encounter for pre-employment examination: Secondary | ICD-10-CM | POA: Diagnosis not present

## 2021-08-31 DIAGNOSIS — E782 Mixed hyperlipidemia: Secondary | ICD-10-CM

## 2021-08-31 DIAGNOSIS — E66813 Obesity, class 3: Secondary | ICD-10-CM | POA: Insufficient documentation

## 2021-08-31 HISTORY — DX: Morbid (severe) obesity due to excess calories: E66.01

## 2021-08-31 HISTORY — DX: Body Mass Index (BMI) 40.0 and over, adult: Z684

## 2021-08-31 MED ORDER — ATORVASTATIN CALCIUM 20 MG PO TABS: 20.0000 mg | ORAL_TABLET | Freq: Every day | ORAL | 1 refills | Status: DC

## 2021-08-31 MED ORDER — AMLODIPINE BESYLATE 5 MG PO TABS: ORAL_TABLET | ORAL | 1 refills | Status: DC

## 2021-08-31 NOTE — Assessment & Plan Note (Signed)
controlled, continue amlodipine 5 mg qd, eat a low salt diet, do not add any salt to food when cooking, avoid processed foods, avoid fried foods ? ?

## 2021-08-31 NOTE — Assessment & Plan Note (Signed)
Will monitor

## 2021-08-31 NOTE — Assessment & Plan Note (Signed)
continue Lipitor 20 mg qd  , eat a low fat diet, increase fiber intake (Benefiber or Metamucil, Cherrios,  oatmeal, beans, nuts, fruits and vegetables), limit saturated fats (in fried foods, red meat), can add OTC fish oil supplement, eat fish with Omega-3 fatty acids like salmon and tuna, exercise for 30 minutes 3 - 5 times a week, drink 8 - 10 glasses of water a day ? ? ?

## 2021-09-03 NOTE — Progress Notes (Signed)
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 Thanks.

## 2021-09-04 LAB — QUANTIFERON-TB GOLD PLUS
QuantiFERON Mitogen Value: 4.44 IU/mL
QuantiFERON Nil Value: 0.07 IU/mL
QuantiFERON TB1 Ag Value: 0.05 IU/mL
QuantiFERON TB2 Ag Value: 0.05 IU/mL
QuantiFERON-TB Gold Plus: NEGATIVE

## 2021-09-05 NOTE — Progress Notes (Signed)
Quantiferon Gold plus is negative. She is cleared for her pre-employment physical. Please print this result and fax it with her form. Thank you.

## 2021-09-06 DIAGNOSIS — F411 Generalized anxiety disorder: Secondary | ICD-10-CM | POA: Diagnosis not present

## 2021-09-06 DIAGNOSIS — F3162 Bipolar disorder, current episode mixed, moderate: Secondary | ICD-10-CM | POA: Diagnosis not present

## 2021-09-06 DIAGNOSIS — F4312 Post-traumatic stress disorder, chronic: Secondary | ICD-10-CM | POA: Diagnosis not present

## 2021-09-06 DIAGNOSIS — F5081 Binge eating disorder: Secondary | ICD-10-CM | POA: Diagnosis not present

## 2021-09-07 DIAGNOSIS — R519 Headache, unspecified: Secondary | ICD-10-CM | POA: Diagnosis not present

## 2021-09-07 DIAGNOSIS — M9902 Segmental and somatic dysfunction of thoracic region: Secondary | ICD-10-CM | POA: Diagnosis not present

## 2021-09-07 DIAGNOSIS — M4802 Spinal stenosis, cervical region: Secondary | ICD-10-CM | POA: Diagnosis not present

## 2021-09-07 DIAGNOSIS — M9901 Segmental and somatic dysfunction of cervical region: Secondary | ICD-10-CM | POA: Diagnosis not present

## 2021-10-06 DIAGNOSIS — F4312 Post-traumatic stress disorder, chronic: Secondary | ICD-10-CM | POA: Diagnosis not present

## 2021-10-06 DIAGNOSIS — F411 Generalized anxiety disorder: Secondary | ICD-10-CM | POA: Diagnosis not present

## 2021-10-06 DIAGNOSIS — F3162 Bipolar disorder, current episode mixed, moderate: Secondary | ICD-10-CM | POA: Diagnosis not present

## 2021-10-06 DIAGNOSIS — F423 Hoarding disorder: Secondary | ICD-10-CM | POA: Diagnosis not present

## 2021-10-20 ENCOUNTER — Other Ambulatory Visit: Payer: Self-pay | Admitting: Physician Assistant

## 2021-10-25 DIAGNOSIS — G4710 Hypersomnia, unspecified: Secondary | ICD-10-CM | POA: Diagnosis not present

## 2021-10-25 DIAGNOSIS — G4733 Obstructive sleep apnea (adult) (pediatric): Secondary | ICD-10-CM | POA: Diagnosis not present

## 2021-11-04 DIAGNOSIS — F3162 Bipolar disorder, current episode mixed, moderate: Secondary | ICD-10-CM | POA: Diagnosis not present

## 2021-11-04 DIAGNOSIS — F900 Attention-deficit hyperactivity disorder, predominantly inattentive type: Secondary | ICD-10-CM | POA: Diagnosis not present

## 2021-11-04 DIAGNOSIS — F411 Generalized anxiety disorder: Secondary | ICD-10-CM | POA: Diagnosis not present

## 2021-11-04 DIAGNOSIS — F4312 Post-traumatic stress disorder, chronic: Secondary | ICD-10-CM | POA: Diagnosis not present

## 2021-11-09 DIAGNOSIS — G43719 Chronic migraine without aura, intractable, without status migrainosus: Secondary | ICD-10-CM | POA: Diagnosis not present

## 2021-11-12 ENCOUNTER — Encounter: Payer: Self-pay | Admitting: Internal Medicine

## 2021-11-25 DIAGNOSIS — G43719 Chronic migraine without aura, intractable, without status migrainosus: Secondary | ICD-10-CM | POA: Diagnosis not present

## 2021-11-25 DIAGNOSIS — M542 Cervicalgia: Secondary | ICD-10-CM | POA: Diagnosis not present

## 2021-12-02 DIAGNOSIS — F3162 Bipolar disorder, current episode mixed, moderate: Secondary | ICD-10-CM | POA: Diagnosis not present

## 2021-12-02 DIAGNOSIS — F423 Hoarding disorder: Secondary | ICD-10-CM | POA: Diagnosis not present

## 2021-12-02 DIAGNOSIS — F4312 Post-traumatic stress disorder, chronic: Secondary | ICD-10-CM | POA: Diagnosis not present

## 2021-12-02 DIAGNOSIS — F900 Attention-deficit hyperactivity disorder, predominantly inattentive type: Secondary | ICD-10-CM | POA: Diagnosis not present

## 2021-12-06 ENCOUNTER — Ambulatory Visit (INDEPENDENT_AMBULATORY_CARE_PROVIDER_SITE_OTHER): Payer: BC Managed Care – PPO | Admitting: Family Medicine

## 2021-12-06 ENCOUNTER — Encounter: Payer: Self-pay | Admitting: Family Medicine

## 2021-12-06 VITALS — BP 138/86 | HR 81 | Temp 98.2°F | Wt 276.0 lb

## 2021-12-06 DIAGNOSIS — R0789 Other chest pain: Secondary | ICD-10-CM | POA: Diagnosis not present

## 2021-12-06 DIAGNOSIS — Z6841 Body Mass Index (BMI) 40.0 and over, adult: Secondary | ICD-10-CM

## 2021-12-06 NOTE — Progress Notes (Signed)
   Subjective:    Patient ID: [name], female    DOB: 1971-01-04, 51 y.o.   MRN: [id number]  HPI She states that approximately 1 week ago she had the onset of chest tightness with slight tingling in the both jaws but no shortness of breath, diaphoresis, weakness, nausea, dizziness.  No family history of heart disease.  She does not smoke and has no allergies.  She did state that she did eructation and the symptoms went away within a few minutes.  She has not had any more these episodes. She then had questions concerning the weight gain.  She thinks it is related to her IBS medication Colestid   Review of Systems     Objective:   Physical Exam Alert and in no distress.  Cardiac exam shows regular rhythm without murmurs or gallops.  Lungs are clear to auscultation.  No chest wall tenderness. EKG read by me shows a rate of 81, sinus rhythm with no abnormal parameters.      Assessment & Plan:  Chest tightness - Plan: EKG 12-Lead  Class 3 severe obesity with serious comorbidity and body mass index (BMI) of 40.0 to 44.9 in adult, unspecified obesity type (Section) I reassured her that I found nothing cardiac.  Explained that this could all be GI.  Then explained that she would need to come back for more thorough history and exam concerning her weight gain.  She expressed understanding of this.

## 2021-12-22 ENCOUNTER — Encounter (INDEPENDENT_AMBULATORY_CARE_PROVIDER_SITE_OTHER): Payer: Self-pay

## 2021-12-31 DIAGNOSIS — Z1231 Encounter for screening mammogram for malignant neoplasm of breast: Secondary | ICD-10-CM | POA: Diagnosis not present

## 2021-12-31 LAB — HM MAMMOGRAPHY

## 2022-01-02 NOTE — Progress Notes (Unsigned)
  No chief complaint on file.     Seen last month by Dr. Redmond School with complaint of chest tightness.  Felt to be GI-related, non-cardiac.  PMH, PSH, SH reviewed  HTN, HLD, ADD, obesity, IBS, OSA, h/o cervical cancer  TG high on last lipid check, in 12/2020. Vickie increased lipitor dose to 16m.  She had med check with LSula Soda4/2023 but no lipids were rechecked. Due for med check (and physical, but PCP no longer here)   PHYSICAL EXAM:  There were no vitals taken for this visit.  Wt Readings from Last 3 Encounters:  12/06/21 276 lb (125.2 kg)  08/31/21 277 lb (125.6 kg)  01/13/21 266 lb 9.6 oz (120.9 kg)      Needs med check with any provider. [facility]  Saw LSula Soda4/2023 for med check and no labs done (just quantiferon) ?!?!?!?!  Due for lipids, c-met, vit D, A1c

## 2022-01-03 ENCOUNTER — Ambulatory Visit (INDEPENDENT_AMBULATORY_CARE_PROVIDER_SITE_OTHER): Payer: BC Managed Care – PPO | Admitting: Family Medicine

## 2022-01-03 ENCOUNTER — Encounter: Payer: Self-pay | Admitting: Family Medicine

## 2022-01-03 VITALS — BP 130/80 | HR 88 | Wt 274.4 lb

## 2022-01-03 DIAGNOSIS — I10 Essential (primary) hypertension: Secondary | ICD-10-CM

## 2022-01-03 DIAGNOSIS — E782 Mixed hyperlipidemia: Secondary | ICD-10-CM | POA: Diagnosis not present

## 2022-01-03 DIAGNOSIS — R1901 Right upper quadrant abdominal swelling, mass and lump: Secondary | ICD-10-CM | POA: Diagnosis not present

## 2022-01-03 DIAGNOSIS — K219 Gastro-esophageal reflux disease without esophagitis: Secondary | ICD-10-CM | POA: Diagnosis not present

## 2022-01-03 DIAGNOSIS — E559 Vitamin D deficiency, unspecified: Secondary | ICD-10-CM

## 2022-01-03 NOTE — Patient Instructions (Signed)
  Take prilosec OTC '20mg'$  once daily before breakfast. Take this for 2 weeks.  This should help with any heartburn and the upper stomach soreness.  We may need to continue this, vs increase the dose if your symptoms aren't resolving. If your discomfort (soreness) and heartburn improve with the prilosec, but recur upon stopping it, you can try taking the Pepcid AC ('10mg'$  or '20mg'$ ) twice daily, and see if this works as well. Take this prior to breakfast and dinner). If you no longer need anything, consider taking the pepcid prior to a meal that you know will trigger your heartburn (spicy, large meal, eating late).  Try and avoid Excedrin--the aspirin can irritate the stomach.  Tylenol and caffeine for a headache may be effective.  The ultrasound didn't show a hernia.  It didn't show a tumor or anything else   If this continues to enlarge, we may need to re-image it. I think most of your symptoms are from reflux rather than the lump.

## 2022-01-10 DIAGNOSIS — F3162 Bipolar disorder, current episode mixed, moderate: Secondary | ICD-10-CM | POA: Diagnosis not present

## 2022-01-10 DIAGNOSIS — F900 Attention-deficit hyperactivity disorder, predominantly inattentive type: Secondary | ICD-10-CM | POA: Diagnosis not present

## 2022-01-10 DIAGNOSIS — F4312 Post-traumatic stress disorder, chronic: Secondary | ICD-10-CM | POA: Diagnosis not present

## 2022-01-10 DIAGNOSIS — F5081 Binge eating disorder: Secondary | ICD-10-CM | POA: Diagnosis not present

## 2022-01-19 ENCOUNTER — Encounter: Payer: Self-pay | Admitting: Internal Medicine

## 2022-01-24 DIAGNOSIS — G4710 Hypersomnia, unspecified: Secondary | ICD-10-CM | POA: Diagnosis not present

## 2022-01-24 DIAGNOSIS — G4733 Obstructive sleep apnea (adult) (pediatric): Secondary | ICD-10-CM | POA: Diagnosis not present

## 2022-01-31 ENCOUNTER — Ambulatory Visit: Payer: BC Managed Care – PPO | Admitting: Family Medicine

## 2022-02-02 NOTE — Progress Notes (Signed)
02/07/2022 [name] [id number] 1970/09/19  Referring provider: Irene Pap, PA-C Primary GI doctor: Dr. Havery Moros  ASSESSMENT AND PLAN:   Assessment: 51 y.o. female here for assessment of the following: 1. Ventral hernia without obstruction or gangrene   2. Chronic diarrhea   3. Gastroesophageal reflux disease, unspecified whether esophagitis present   4. Fatty liver   5. Irritable bowel syndrome with diarrhea   6. Stress incontinence of urine    03/22/2017 colonoscopy Dr. [facility] showed sigmoid diverticulosis otherwise normal, biopsies negative microscopic colitis. Due in 2028. Long standing history of chronic diarrhea Some response to Colestid but stopped Viberzi helped but had fatigue.  Negative celiac, negative microscopic colitis on colonoscopy   12/28/20 CT AB and pelvis main findings are small ventral hernia that contains some fat but no evidence of complicating features. There is nothing else of note, seems normal otherwise. Retractable hernia on exam, some mild tenderness  Plan: Get labs, no alarm features for diarrhea.  - Discussed FODMAP diet, avoid lactose.  - will do trial of xifaxin for IBS-D, get back on colestid, consider repeat endoscopy if any change in symptoms otherwise due 03/2027 - Possible pelvic floor component, will refer to pelvic floor PT.  - For ventral hernia will get limited US and refer to general surgery, ER precautions discussed.   Check LFTs.  - discussed weight loss as this could potentially help hernia and symptoms and fatty liver  Orders Placed This Encounter  Procedures   US Abdomen Limited   CBC with Differential/Platelet   Comprehensive metabolic panel   TSH   Sedimentation rate   High sensitivity CRP   Ambulatory referral to General Surgery   AMB referral to rehabilitation    Meds ordered this encounter  Medications   rifaximin (XIFAXAN) 550 MG TABS tablet    Sig: Take 1 tablet (550 mg total) by  mouth 3 (three) times daily for 14 days.    Dispense:  42 tablet    Refill:  0   colestipol (COLESTID) 1 g tablet    Sig: Take 1-2 tablets (1-2 g total) by mouth 2 (two) times daily.    Dispense:  120 tablet    Refill:  2    History of Present Illness:  51 y.o. female  with a past medical history of fatty liver, cervical cancer, chronic diarrhea and others listed below, returns to clinic today for evaluation of IBS-D and hernia.  10/15/2020 office visit with Dr. Havery Moros chronic diarrhea-favorable response to Viberzi however too expensive can cause drowsiness, Imodium causes her to feel poorly.   Given trial of colestid which helped for a few years but has not helped.  Negative celiac  2014 colonoscopy random biopsies negative for microscopic colitis, EGD showed negative celiac small bowel biopsies. 03/22/2017 colonoscopy Dr. [facility] showed sigmoid diverticulosis otherwise normal, biopsies negative microscopic colitis. Due in 2028.  2018 CT abdomen pelvis  unremarkable 07/31/2019 right upper quadrant ultrasound showed fatty liver, gallbladder polyps no acute changes. 12/28/20 CT AB and pelvis main findings are small ventral hernia that contains some fat but no evidence of complicating features. There is nothing else of note, seems normal otherwise.  States she has always had small pouch on her AB, worse with sitting and standing. Some discomfort.  Can have diarrhea 8-12 times a day.  Has lower AB pain worse before BM, better after BM, can be very intense and into her legs. Can feel she has urgency, has  had 3 episodes of fecal incontinence in last 2-3 years.  No hematochezia, no weight loss, has gained some weight.  Gets UTI afterwards, has not happened in 1 year.  She she has urinary frequency, can have 2-3 nocturia, no urinary incontinence except with coughing and sneezing.  She has had previous hernia repair with Dr. Marcello Moores after her cervical cancer surgery.   She  reports  that she quit smoking about 23 years ago. Her smoking use included cigarettes. She has never used smokeless tobacco. She reports that she does not currently use alcohol. She reports that she does not use drugs. Her family history includes Bladder Cancer (age of onset: 15) in her father; Cancer in her father; Colon cancer in her paternal uncle; Diabetes in her father and maternal grandfather; Heart disease in her maternal grandfather; Hyperlipidemia in her father and mother; Hypertension in her father and mother; Irritable bowel syndrome in her father; Obesity in her father; Stroke in her maternal grandfather.   Current Medications:    Current Outpatient Medications (Cardiovascular):    amLODipine (NORVASC) 5 MG tablet, TAKE 1 TABLET(5 MG) BY MOUTH DAILY   atorvastatin (LIPITOR) 20 MG tablet, Take 1 tablet (20 mg total) by mouth daily.   colestipol (COLESTID) 1 g tablet, Take 1-2 tablets (1-2 g total) by mouth 2 (two) times daily.   Current Outpatient Medications (Analgesics):    rizatriptan (MAXALT) 10 MG tablet, Take by mouth.   Current Outpatient Medications (Other):    AZO-CRANBERRY PO, Take 2 tablets by mouth daily.   chlorproMAZINE (THORAZINE) 25 MG tablet, Take 25 mg by mouth daily as needed (migraine headaches).   famotidine (PEPCID) 10 MG tablet, Take 1-2 tablets (10-20 mg total) by mouth daily. May increase to twice daily as needed   lamoTRIgine (LAMICTAL) 200 MG tablet, Take 200 mg by mouth daily.   lamoTRIgine (LAMICTAL) 25 MG CHEW chewable tablet, Chew 25 mg by mouth daily.   Loperamide HCl (IMODIUM A-D PO), Take 1 tablet by mouth as needed.   Multiple Vitamin (MULTIVITAMIN WITH MINERALS) TABS tablet, Take 1 tablet by mouth daily.   Omega-3 Fatty Acids (FISH OIL) 1000 MG CAPS, Take 1,000 tablets by mouth daily.   Polyethyl Glycol-Propyl Glycol (SYSTANE OP), Apply 1 drop to eye daily as needed (dry/irritated eyes).   rifaximin (XIFAXAN) 550 MG TABS tablet, Take 1 tablet (550 mg  total) by mouth 3 (three) times daily for 14 days.   VITAMIN D PO, Take by mouth.  Surgical History:  She  has a past surgical history that includes Incision and drainage perirectal abscess (N/A, 04/26/2017); Wisdom tooth extraction; Colonoscopy; Exam under anesthesia with anal fistulectomy (N/A, 07/06/2017); Placement of seton (N/A, 07/06/2017); LAPAROSCOPY INCISIONAL HERNIA REPAIR W/ MESH, LYSIS ADHESIONS (01-10-2014   '@[facility]'$ ); Total abdominal hysterectomy w/ bilateral salpingoophorectomy (06-11-2013    dr Rayford Halsted '@[facility]'$ ); Breast surgery (Right, 11/25/2008); Ligation of internal fistula tract (N/A, 03/08/2018); Abdominal hysterectomy (06/11/2013); Colposcopy; Hernia repair; and Total knee arthroplasty (Left, 04/29/2019).  Current Medications, Allergies, Past Medical History, Past Surgical History, Family History and Social History were reviewed in [employer] record.  Physical Exam: BP 120/70 Comment: large cuff  Pulse 73   Ht 5' 4.5" (1.638 m)   Wt 265 lb (120.2 kg)   BMI 44.78 kg/m  General:   Pleasant, obese female in no acute distress Heart : Regular rate and rhythm; no murmurs Pulm: Clear anteriorly; no wheezing Abdomen:  Soft, Obese AB, Active bowel sounds. mild tenderness  at  ventral hernia upper AB , easily retractableWith guarding and Without rebound, No organomegaly appreciated. Rectal: Not evaluated Extremities:  without  edema. Neurologic:  Alert and  oriented x4;  No focal deficits.  Psych:  Cooperative. Normal mood and affect.   Vladimir Crofts, PA-C 02/07/22

## 2022-02-07 ENCOUNTER — Telehealth: Payer: Self-pay

## 2022-02-07 ENCOUNTER — Encounter: Payer: Self-pay | Admitting: Physician Assistant

## 2022-02-07 ENCOUNTER — Ambulatory Visit (INDEPENDENT_AMBULATORY_CARE_PROVIDER_SITE_OTHER): Payer: BC Managed Care – PPO | Admitting: Physician Assistant

## 2022-02-07 ENCOUNTER — Other Ambulatory Visit (HOSPITAL_COMMUNITY): Payer: Self-pay

## 2022-02-07 VITALS — BP 120/70 | HR 73 | Ht 64.5 in | Wt 265.0 lb

## 2022-02-07 DIAGNOSIS — K529 Noninfective gastroenteritis and colitis, unspecified: Secondary | ICD-10-CM | POA: Diagnosis not present

## 2022-02-07 DIAGNOSIS — F411 Generalized anxiety disorder: Secondary | ICD-10-CM | POA: Diagnosis not present

## 2022-02-07 DIAGNOSIS — F4312 Post-traumatic stress disorder, chronic: Secondary | ICD-10-CM | POA: Diagnosis not present

## 2022-02-07 DIAGNOSIS — Z1211 Encounter for screening for malignant neoplasm of colon: Secondary | ICD-10-CM

## 2022-02-07 DIAGNOSIS — K439 Ventral hernia without obstruction or gangrene: Secondary | ICD-10-CM | POA: Diagnosis not present

## 2022-02-07 DIAGNOSIS — K219 Gastro-esophageal reflux disease without esophagitis: Secondary | ICD-10-CM | POA: Diagnosis not present

## 2022-02-07 DIAGNOSIS — K760 Fatty (change of) liver, not elsewhere classified: Secondary | ICD-10-CM | POA: Diagnosis not present

## 2022-02-07 DIAGNOSIS — F3162 Bipolar disorder, current episode mixed, moderate: Secondary | ICD-10-CM | POA: Diagnosis not present

## 2022-02-07 DIAGNOSIS — N393 Stress incontinence (female) (male): Secondary | ICD-10-CM

## 2022-02-07 DIAGNOSIS — F5081 Binge eating disorder: Secondary | ICD-10-CM | POA: Diagnosis not present

## 2022-02-07 DIAGNOSIS — K580 Irritable bowel syndrome with diarrhea: Secondary | ICD-10-CM

## 2022-02-07 MED ORDER — COLESTIPOL HCL 1 G PO TABS: 1.0000 g | ORAL_TABLET | Freq: Two times a day (BID) | ORAL | 2 refills | Status: DC

## 2022-02-07 MED ORDER — RIFAXIMIN 550 MG PO TABS: 550.0000 mg | ORAL_TABLET | Freq: Three times a day (TID) | ORAL | 0 refills | Status: AC

## 2022-02-07 NOTE — Progress Notes (Signed)
Agree with assessment and plan as outlined.  

## 2022-02-07 NOTE — Telephone Encounter (Signed)
Patient Advocate Encounter   Received notification that prior authorization is required for Xifaxan '550MG'$  tablets  Submitted: 02-07-2022 Key [id number]  Status is pending

## 2022-02-07 NOTE — Patient Instructions (Signed)
Your provider has requested that you go to the basement level for lab work before leaving today. Press "B" on the elevator. The lab is located at the first door on the left as you exit the elevator.  We have sent the following medications to your pharmacy for you to pick up at your convenience: Payne have been scheduled for an abdominal ultrasound at [facility] (1st floor of hospital) on 02/09/22 at 8 am. Please arrive 15 minutes prior to your appointment for registration. Make certain not to have anything to eat or drink after midnight prior to your appointment. Should you need to reschedule your appointment, please contact radiology at [phone]. This test typically takes about 30 minutes to perform.   Due to recent changes in healthcare laws, you may see the results of your imaging and laboratory studies on MyChart before your provider has had a chance to review them.  We understand that in some cases there may be results that are confusing or concerning to you. Not all laboratory results come back in the same time frame and the provider may be waiting for multiple results in order to interpret others.  Please give [place] 48 hours in order for your provider to thoroughly review all the results before contacting the office for clarification of your results.     First do a trial off milk/lactose products if you use them.  Add fiber like benefiber or citracel once a day Increase activity Can do trial of IBGard which is over the counter for AB pain- Take 1-2 capsules once a day for maintence or twice a day during a flare Please try to decrease stress. consider talking with PCP about anti anxiety medication or try head space app for meditation. if any worsening symptoms like blood in stool, weight loss, please call the office   Please try low FODMAP diet- see below- start with eliminating just one column at a time, the table at the very bottom contains foods that are safe to  take   FODMAP stands for fermentable oligo-, di-, mono-saccharides and polyols (1). These are the scientific terms used to classify groups of carbs that are notorious for triggering digestive symptoms like bloating, gas and stomach pain.   Will do trial of xifaxin.  Will redo the colestid, can take up to 4 tablets a day.      Ventral Hernia  A ventral hernia is a bulge of tissue from inside the abdomen that pushes through a weak area of the muscles that form the front wall of the abdomen. The tissues inside the abdomen are inside a sac (peritoneum). These tissues include the small intestine, large intestine, and the fatty tissue that covers the intestines (omentum). Sometimes, the bulge that forms a hernia contains intestines. Other hernias contain only fat. Ventral hernias do not go away without surgical treatment. There are several types of ventral hernias. You may have: A hernia at an incision site from previous abdominal surgery (incisional hernia). A hernia just above the belly button (epigastric hernia), or at the belly button (umbilical hernia). These types of hernias can develop from heavy lifting or straining. A hernia that comes and goes (reducible hernia). It may be visible only when you lift or strain. This type of hernia can be pushed back into the abdomen (reduced). A hernia that traps abdominal tissue inside the hernia (incarcerated hernia). This type of hernia does not reduce. A hernia that cuts off blood flow to the tissues  inside the hernia (strangulated hernia). The tissues can start to die if this happens. This is a very painful bulge that cannot be reduced. A strangulated hernia is a medical emergency. What are the causes? This condition is caused by abdominal tissue putting pressure on an area of weakness in the abdominal muscles. What increases the risk? The following factors may make you more likely to develop this condition: Being age 64 or older. Being overweight or  obese. Having had previous abdominal surgery, especially if there was an infection after surgery. Having had an injury to the abdominal wall. Frequently lifting or pushing heavy objects. Having had several pregnancies. Having a buildup of fluid inside the abdomen (ascites). Straining to have a bowel movement or to urinate. Having frequent coughing episodes. What are the signs or symptoms? The only symptom of a ventral hernia may be a painless bulge in the abdomen. A reducible hernia may be visible only when you strain, cough, or lift. Other symptoms may include: Dull pain. A feeling of pressure. Signs and symptoms of a strangulated hernia may include: Increasing pain. Nausea and vomiting. Pain when pressing on the hernia. The skin over the hernia turning red or purple. Constipation. Blood in the stool (feces). How is this diagnosed? This condition may be diagnosed based on: Your symptoms. Your medical history. A physical exam. You may be asked to cough or strain while standing. These actions increase the pressure inside your abdomen and force the hernia through the opening in your muscles. Your health care provider may try to reduce the hernia by gently pushing the hernia back in. Imaging studies, such as an ultrasound or CT scan. How is this treated? This condition is treated with surgery. If you have a strangulated hernia, surgery is done as soon as possible. If your hernia is small and not incarcerated, you may be asked to lose some weight before surgery. Follow these instructions at home: Follow instructions from your health care provider about eating or drinking restrictions. If you are overweight, your health care provider may recommend that you increase your activity level and eat a healthier diet. Do not lift anything that is heavier than 10 lb (4.5 kg), or the limit that you are told, until your health care provider says that it is safe. Return to your normal activities as  told by your health care provider. Ask your health care provider what activities are safe for you. You may need to avoid activities that increase pressure on your hernia. Take over-the-counter and prescription medicines only as told by your health care provider. Keep all follow-up visits. This is important. Contact a health care provider if: Your hernia gets larger. Your hernia becomes painful. Get help right away if: Your hernia becomes increasingly painful. You have pain along with any of the following: Changes in skin color in the area of the hernia. Nausea. Vomiting. Fever. These symptoms may represent a serious problem that is an emergency. Do not wait to see if the symptoms will go away. Get medical help right away. Call your local emergency services (911 in the [place] Do not drive yourself to the hospital. Summary A ventral hernia is a bulge of tissue from inside the abdomen that pushes through a weak area of the muscles that form the front wall of the abdomen. This condition is treated with surgery, which may be urgent depending on your hernia. Do not lift anything that is heavier than 10 lb (4.5 kg), and follow activity instructions from your health  care provider. This information is not intended to replace advice given to you by your health care provider. Make sure you discuss any questions you have with your health care provider. Document Revised: 12/20/2019 Document Reviewed: 12/20/2019 Elsevier Patient Education  [place].

## 2022-02-08 DIAGNOSIS — G43719 Chronic migraine without aura, intractable, without status migrainosus: Secondary | ICD-10-CM | POA: Diagnosis not present

## 2022-02-09 ENCOUNTER — Other Ambulatory Visit (HOSPITAL_COMMUNITY): Payer: BC Managed Care – PPO

## 2022-02-09 NOTE — Telephone Encounter (Signed)
Patient Advocate Encounter  Prior Authorization for Xifaxan '550MG'$  tablets has been approved.     Effective: 02-09-2022 to 02-06-2023

## 2022-02-11 ENCOUNTER — Ambulatory Visit (HOSPITAL_BASED_OUTPATIENT_CLINIC_OR_DEPARTMENT_OTHER)
Admission: RE | Admit: 2022-02-11 | Discharge: 2022-02-11 | Disposition: A | Discharge status: Home / Self Care | Payer: BC Managed Care – PPO | Source: Ambulatory Visit | Attending: Physician Assistant | Admitting: Physician Assistant

## 2022-02-11 DIAGNOSIS — K439 Ventral hernia without obstruction or gangrene: Secondary | ICD-10-CM | POA: Insufficient documentation

## 2022-02-14 ENCOUNTER — Ambulatory Visit: Payer: BC Managed Care – PPO | Admitting: Family Medicine

## 2022-02-17 DIAGNOSIS — Z8719 Personal history of other diseases of the digestive system: Secondary | ICD-10-CM | POA: Diagnosis not present

## 2022-02-17 DIAGNOSIS — Z9889 Other specified postprocedural states: Secondary | ICD-10-CM | POA: Diagnosis not present

## 2022-02-17 DIAGNOSIS — K439 Ventral hernia without obstruction or gangrene: Secondary | ICD-10-CM | POA: Diagnosis not present

## 2022-02-22 ENCOUNTER — Encounter: Payer: Self-pay | Admitting: Internal Medicine

## 2022-02-23 ENCOUNTER — Other Ambulatory Visit (INDEPENDENT_AMBULATORY_CARE_PROVIDER_SITE_OTHER): Payer: BC Managed Care – PPO

## 2022-02-23 DIAGNOSIS — K529 Noninfective gastroenteritis and colitis, unspecified: Secondary | ICD-10-CM | POA: Diagnosis not present

## 2022-02-23 DIAGNOSIS — H5213 Myopia, bilateral: Secondary | ICD-10-CM | POA: Diagnosis not present

## 2022-02-23 LAB — COMPREHENSIVE METABOLIC PANEL
ALT: 49 U/L — ABNORMAL HIGH (ref 0–35)
AST: 31 U/L (ref 0–37)
Albumin: 4.6 g/dL (ref 3.5–5.2)
Alkaline Phosphatase: 134 U/L — ABNORMAL HIGH (ref 39–117)
BUN: 10 mg/dL (ref 6–23)
CO2: 30 mEq/L (ref 19–32)
Calcium: 9.7 mg/dL (ref 8.4–10.5)
Chloride: 101 mEq/L (ref 96–112)
Creatinine, Ser: 0.64 mg/dL (ref 0.40–1.20)
GFR: 101.76 mL/min (ref >60.00)
Glucose, Bld: 94 mg/dL (ref 70–99)
Potassium: 3.8 mEq/L (ref 3.5–5.1)
Sodium: 141 mEq/L (ref 135–145)
Total Bilirubin: 1.8 mg/dL — ABNORMAL HIGH (ref 0.2–1.2)
Total Protein: 7.9 g/dL (ref 6.0–8.3)

## 2022-02-23 LAB — CBC WITH DIFFERENTIAL/PLATELET
Basophils Absolute: 0.1 10*3/uL (ref 0.0–0.1)
Basophils Relative: 0.9 % (ref 0.0–3.0)
Eosinophils Absolute: 0.3 10*3/uL (ref 0.0–0.7)
Eosinophils Relative: 4 % (ref 0.0–5.0)
HCT: 43.4 % (ref 36.0–46.0)
Hemoglobin: 14.6 g/dL (ref 12.0–15.0)
Lymphocytes Relative: 27.8 % (ref 12.0–46.0)
Lymphs Abs: 2.4 10*3/uL (ref 0.7–4.0)
MCHC: 33.7 g/dL (ref 30.0–36.0)
MCV: 88 fl (ref 78.0–100.0)
Monocytes Absolute: 0.9 10*3/uL (ref 0.1–1.0)
Monocytes Relative: 10 % (ref 3.0–12.0)
Neutro Abs: 4.9 10*3/uL (ref 1.4–7.7)
Neutrophils Relative %: 57.3 % (ref 43.0–77.0)
Platelets: 274 10*3/uL (ref 150.0–400.0)
RBC: 4.93 Mil/uL (ref 3.87–5.11)
RDW: 14.1 % (ref 11.5–15.5)
WBC: 8.6 10*3/uL (ref 4.0–10.5)

## 2022-02-23 LAB — HIGH SENSITIVITY CRP: CRP, High Sensitivity: 5.31 mg/L — ABNORMAL HIGH (ref 0.000–5.000)

## 2022-02-23 LAB — TSH: TSH: 2.92 u[IU]/mL (ref 0.35–5.50)

## 2022-02-23 LAB — SEDIMENTATION RATE: Sed Rate: 16 mm/hr (ref 0–30)

## 2022-02-24 ENCOUNTER — Other Ambulatory Visit: Payer: Self-pay

## 2022-02-24 DIAGNOSIS — G43719 Chronic migraine without aura, intractable, without status migrainosus: Secondary | ICD-10-CM | POA: Diagnosis not present

## 2022-02-24 DIAGNOSIS — R7982 Elevated C-reactive protein (CRP): Secondary | ICD-10-CM

## 2022-02-24 DIAGNOSIS — M542 Cervicalgia: Secondary | ICD-10-CM | POA: Diagnosis not present

## 2022-02-25 ENCOUNTER — Other Ambulatory Visit: Payer: Self-pay | Admitting: Physician Assistant

## 2022-02-25 ENCOUNTER — Other Ambulatory Visit: Payer: Self-pay | Admitting: Surgery

## 2022-02-25 DIAGNOSIS — K439 Ventral hernia without obstruction or gangrene: Secondary | ICD-10-CM

## 2022-02-27 NOTE — Patient Instructions (Incomplete)
I recommend getting the new shingles vaccine (Shingrix). You may want to check with your insurance to verify what your out of pocket cost may be (usually covered as preventative, but better to verify to avoid any surprises, as this vaccine is expensive), and then schedule a nurse visit at our office when convenient (based on the possible side effects as discussed).   This is a series of 2 injections, spaced 2 months apart.  It doesn't have to be exactly 2 months apart (but can't be sooner), if that isn't feasible for your schedule, but try and get them close to 2 months (and definitely within 6 months of each other, or else the efficacy of the vaccine drops off). This should be separated from other vaccines by at least 2 weeks.  COVID booster is recommended.   Consider restarting omega-3 fish oil at 3000mg  daily (after your surgery). Continue to limit your sweets and regular soda. Try and cut back on red meat to 1-2 times/week, eating more poultry, fish and plant-based proteins (nuts, seeds, legumes). Always use lowfat dairy products (vs using non-dairy milks)  Cut back on processed meats. Look at the labels and limit sodium in your diet.

## 2022-02-27 NOTE — Progress Notes (Unsigned)
No chief complaint on file.   Patient presents to f/u on lipids. These were last checked in 12/2020.  At that time, [name] increased her atorvastatin dose to '20mg'$ . She has not had lipids repeated since that time.  She is planning for surgical repair of ventral hernia.  She had recent US which showed fat containing midline ventral hernia. CT has been ordered. She had recent labs done, with mildly elevated LFT, and elevated hs-CRP  Lab Results  Component Value Date   CHOL 216 (H) 01/13/2021   HDL 42 01/13/2021   LDLCALC 125 (H) 01/13/2021   TRIG 276 (H) 01/13/2021   CHOLHDL 5.1 (H) 01/13/2021   Lab Results  Component Value Date   ALT 49 (H) 02/23/2022   AST 31 02/23/2022   ALKPHOS 134 (H) 02/23/2022   BILITOT 1.8 (H) 02/23/2022   Hs-CRP 5.310  She had normal CBC, electrolytes.  Current diet includes:  Hypertension follow-up:  Blood pressures elsewhere are ***.  Denies dizziness, headaches, chest pain.  Denies side effects of medications.  BP Readings from Last 3 Encounters:  02/07/22 120/70  01/03/22 130/80  12/06/21 138/86     PMH, PSH, SH reviewed    ROS:  no fever, chills, headaches, dizziness, URI symptoms, chest pain, shortness of breath. Infrequent heartburn No n/v/d No bleeding, bruising, rash  +intentional weight loss   PHYSICAL EXAM:  There were no vitals taken for this visit.  Wt Readings from Last 3 Encounters:  02/07/22 265 lb (120.2 kg)  01/03/22 274 lb 6.4 oz (124.5 kg)  12/06/21 276 lb (125.2 kg)     ASSESSMENT/PLAN:  Flu, COVID, Tdap and Shingrix all due/recommended. Offer flu, COVID.  If declines COVID, then offer TdaP today (vs getting at physical, which will need to be scheduled with new provider). Likely needs to check insurance before shingrix    Lipids Atorvastatin refill due--AFTER LABS  Send note to other doc that CRP future order wasn't entered (said repeat with CBC, and only CBC order placed).

## 2022-02-28 ENCOUNTER — Encounter: Payer: Self-pay | Admitting: Family Medicine

## 2022-02-28 ENCOUNTER — Ambulatory Visit (INDEPENDENT_AMBULATORY_CARE_PROVIDER_SITE_OTHER): Payer: BC Managed Care – PPO | Admitting: Family Medicine

## 2022-02-28 VITALS — BP 138/84 | HR 80 | Ht 65.0 in | Wt 265.0 lb

## 2022-02-28 DIAGNOSIS — E782 Mixed hyperlipidemia: Secondary | ICD-10-CM | POA: Diagnosis not present

## 2022-02-28 DIAGNOSIS — Z23 Encounter for immunization: Secondary | ICD-10-CM | POA: Diagnosis not present

## 2022-02-28 DIAGNOSIS — I10 Essential (primary) hypertension: Secondary | ICD-10-CM

## 2022-02-28 DIAGNOSIS — K439 Ventral hernia without obstruction or gangrene: Secondary | ICD-10-CM

## 2022-03-01 ENCOUNTER — Telehealth: Payer: Self-pay

## 2022-03-01 ENCOUNTER — Other Ambulatory Visit: Payer: Self-pay

## 2022-03-01 DIAGNOSIS — R7982 Elevated C-reactive protein (CRP): Secondary | ICD-10-CM

## 2022-03-01 LAB — LIPID PANEL
Chol/HDL Ratio: 3.6 ratio (ref 0.0–4.4)
Cholesterol, Total: 178 mg/dL (ref 100–199)
HDL: 49 mg/dL (ref >39)
LDL Chol Calc (NIH): 108 mg/dL — ABNORMAL HIGH (ref 0–99)
Triglycerides: 120 mg/dL (ref 0–149)
VLDL Cholesterol Cal: 21 mg/dL (ref 5–40)

## 2022-03-01 MED ORDER — ATORVASTATIN CALCIUM 20 MG PO TABS: 20.0000 mg | ORAL_TABLET | Freq: Every day | ORAL | 3 refills | Status: DC

## 2022-03-01 NOTE — Telephone Encounter (Signed)
Additional lab ordered.  Merril Abbe, MD; Bonner Puna A, RN  Should have repeat CBC and hs-CRP.  Thanks!

## 2022-03-02 ENCOUNTER — Other Ambulatory Visit: Payer: Self-pay

## 2022-03-02 ENCOUNTER — Ambulatory Visit: Payer: BC Managed Care – PPO | Attending: Physician Assistant | Admitting: Physical Therapy

## 2022-03-02 DIAGNOSIS — M6281 Muscle weakness (generalized): Secondary | ICD-10-CM | POA: Diagnosis not present

## 2022-03-02 DIAGNOSIS — N393 Stress incontinence (female) (male): Secondary | ICD-10-CM | POA: Diagnosis not present

## 2022-03-02 DIAGNOSIS — R293 Abnormal posture: Secondary | ICD-10-CM | POA: Diagnosis not present

## 2022-03-02 DIAGNOSIS — K529 Noninfective gastroenteritis and colitis, unspecified: Secondary | ICD-10-CM | POA: Diagnosis not present

## 2022-03-02 DIAGNOSIS — R279 Unspecified lack of coordination: Secondary | ICD-10-CM | POA: Diagnosis not present
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
## 2022-03-03 ENCOUNTER — Ambulatory Visit
Admission: RE | Admit: 2022-03-03 | Discharge: 2022-03-03 | Disposition: A | Discharge status: Home / Self Care | Payer: BC Managed Care – PPO | Source: Ambulatory Visit | Attending: Surgery | Admitting: Surgery

## 2022-03-03 DIAGNOSIS — K439 Ventral hernia without obstruction or gangrene: Secondary | ICD-10-CM | POA: Diagnosis not present

## 2022-03-03 DIAGNOSIS — K5730 Diverticulosis of large intestine without perforation or abscess without bleeding: Secondary | ICD-10-CM | POA: Diagnosis not present

## 2022-03-03 DIAGNOSIS — K429 Umbilical hernia without obstruction or gangrene: Secondary | ICD-10-CM | POA: Diagnosis not present

## 2022-03-03 DIAGNOSIS — K8689 Other specified diseases of pancreas: Secondary | ICD-10-CM | POA: Diagnosis not present

## 2022-03-03 MED ORDER — IOPAMIDOL (ISOVUE-300) INJECTION 61%
100.0000 mL | Freq: Once | INTRAVENOUS | Status: AC | PRN
  Administered 2022-03-03: 100 mL via INTRAVENOUS

## 2022-03-18 ENCOUNTER — Ambulatory Visit: Payer: BC Managed Care – PPO | Attending: Physician Assistant | Admitting: Physical Therapy

## 2022-03-18 DIAGNOSIS — R279 Unspecified lack of coordination: Secondary | ICD-10-CM | POA: Insufficient documentation

## 2022-03-18 DIAGNOSIS — R293 Abnormal posture: Secondary | ICD-10-CM | POA: Insufficient documentation

## 2022-03-18 DIAGNOSIS — K529 Noninfective gastroenteritis and colitis, unspecified: Secondary | ICD-10-CM | POA: Insufficient documentation

## 2022-03-18 DIAGNOSIS — M6281 Muscle weakness (generalized): Secondary | ICD-10-CM | POA: Insufficient documentation

## 2022-03-18 DIAGNOSIS — N393 Stress incontinence (female) (male): Secondary | ICD-10-CM | POA: Insufficient documentation

## 2022-03-24 DIAGNOSIS — Z9889 Other specified postprocedural states: Secondary | ICD-10-CM | POA: Diagnosis not present

## 2022-03-24 DIAGNOSIS — Z90710 Acquired absence of both cervix and uterus: Secondary | ICD-10-CM | POA: Diagnosis not present

## 2022-03-24 DIAGNOSIS — C531 Malignant neoplasm of exocervix: Secondary | ICD-10-CM | POA: Diagnosis not present

## 2022-03-24 DIAGNOSIS — Z90722 Acquired absence of ovaries, bilateral: Secondary | ICD-10-CM | POA: Diagnosis not present

## 2022-03-24 DIAGNOSIS — Z9079 Acquired absence of other genital organ(s): Secondary | ICD-10-CM | POA: Diagnosis not present

## 2022-03-25 ENCOUNTER — Ambulatory Visit: Payer: BC Managed Care – PPO | Admitting: Physical Therapy

## 2022-03-28 DIAGNOSIS — Z9079 Acquired absence of other genital organ(s): Secondary | ICD-10-CM | POA: Diagnosis not present

## 2022-03-28 DIAGNOSIS — Z08 Encounter for follow-up examination after completed treatment for malignant neoplasm: Secondary | ICD-10-CM | POA: Diagnosis not present

## 2022-03-28 DIAGNOSIS — C539 Malignant neoplasm of cervix uteri, unspecified: Secondary | ICD-10-CM | POA: Diagnosis not present

## 2022-03-28 DIAGNOSIS — Z8541 Personal history of malignant neoplasm of cervix uteri: Secondary | ICD-10-CM | POA: Diagnosis not present

## 2022-03-28 DIAGNOSIS — Z90710 Acquired absence of both cervix and uterus: Secondary | ICD-10-CM | POA: Diagnosis not present

## 2022-03-28 DIAGNOSIS — Z90722 Acquired absence of ovaries, bilateral: Secondary | ICD-10-CM | POA: Diagnosis not present

## 2022-03-29 ENCOUNTER — Encounter: Payer: Self-pay | Admitting: Internal Medicine

## 2022-03-31 DIAGNOSIS — D2261 Melanocytic nevi of right upper limb, including shoulder: Secondary | ICD-10-CM | POA: Diagnosis not present

## 2022-03-31 DIAGNOSIS — L918 Other hypertrophic disorders of the skin: Secondary | ICD-10-CM | POA: Diagnosis not present

## 2022-03-31 DIAGNOSIS — L821 Other seborrheic keratosis: Secondary | ICD-10-CM | POA: Diagnosis not present

## 2022-03-31 DIAGNOSIS — D2262 Melanocytic nevi of left upper limb, including shoulder: Secondary | ICD-10-CM | POA: Diagnosis not present

## 2022-03-31 DIAGNOSIS — D1801 Hemangioma of skin and subcutaneous tissue: Secondary | ICD-10-CM | POA: Diagnosis not present

## 2022-04-01 ENCOUNTER — Encounter: Payer: BC Managed Care – PPO | Admitting: Physical Therapy

## 2022-04-05 ENCOUNTER — Encounter: Payer: BC Managed Care – PPO | Admitting: Physical Therapy

## 2022-04-06 DIAGNOSIS — F4312 Post-traumatic stress disorder, chronic: Secondary | ICD-10-CM | POA: Diagnosis not present

## 2022-04-06 DIAGNOSIS — F3162 Bipolar disorder, current episode mixed, moderate: Secondary | ICD-10-CM | POA: Diagnosis not present

## 2022-04-06 DIAGNOSIS — F411 Generalized anxiety disorder: Secondary | ICD-10-CM | POA: Diagnosis not present

## 2022-04-06 DIAGNOSIS — F423 Hoarding disorder: Secondary | ICD-10-CM | POA: Diagnosis not present

## 2022-04-13 ENCOUNTER — Encounter: Payer: BC Managed Care – PPO | Admitting: Physical Therapy

## 2022-04-15 ENCOUNTER — Ambulatory Visit: Payer: Self-pay | Admitting: Surgery

## 2022-04-19 ENCOUNTER — Encounter (HOSPITAL_BASED_OUTPATIENT_CLINIC_OR_DEPARTMENT_OTHER): Payer: Self-pay | Admitting: Surgery

## 2022-04-20 ENCOUNTER — Encounter: Payer: BC Managed Care – PPO | Admitting: Physical Therapy

## 2022-04-20 ENCOUNTER — Encounter (HOSPITAL_BASED_OUTPATIENT_CLINIC_OR_DEPARTMENT_OTHER): Payer: Self-pay | Admitting: Surgery

## 2022-04-20 NOTE — Progress Notes (Addendum)
Spoke w/ via phone for pre-op interview--- pt Lab needs dos----  [employer] results------ current EKG in epic/ chart COVID test -----patient states asymptomatic no test needed Arrive at -------  0530 on 04-22-2022 NPO after MN NO Solid Food.  Clear liquids from MN until--- 0430 Med rec completed Medications to take morning of surgery ----- lamictal , norvasc, pepcid, and if needed may take imodium or maxalt Diabetic medication ----- n/a Patient instructed no nail polish to be worn day of surgery Patient instructed to bring photo id and insurance card day of surgery Patient aware to have Driver (ride ) / caregiver for 24 hours after surgery -- mother, [name] Patient Special Instructions ----- n/a Pre-Op special Istructions ----- n/a Patient verbalized understanding of instructions that were given at this phone interview. Patient denies shortness of breath, chest pain, fever, cough at this phone interview.

## 2022-04-22 ENCOUNTER — Ambulatory Visit (HOSPITAL_BASED_OUTPATIENT_CLINIC_OR_DEPARTMENT_OTHER): Payer: BC Managed Care – PPO | Admitting: Anesthesiology

## 2022-04-22 ENCOUNTER — Encounter (HOSPITAL_BASED_OUTPATIENT_CLINIC_OR_DEPARTMENT_OTHER)
Admission: RE | Disposition: A | Discharge status: Home / Self Care | Payer: Self-pay | Source: Home / Self Care | Attending: Surgery

## 2022-04-22 ENCOUNTER — Encounter (HOSPITAL_BASED_OUTPATIENT_CLINIC_OR_DEPARTMENT_OTHER): Payer: Self-pay | Admitting: Surgery

## 2022-04-22 ENCOUNTER — Other Ambulatory Visit: Payer: Self-pay

## 2022-04-22 ENCOUNTER — Ambulatory Visit (HOSPITAL_BASED_OUTPATIENT_CLINIC_OR_DEPARTMENT_OTHER)
Admission: RE | Admit: 2022-04-22 | Discharge: 2022-04-22 | Disposition: A | Discharge status: Home / Self Care | Payer: BC Managed Care – PPO | Attending: Surgery | Admitting: Surgery

## 2022-04-22 DIAGNOSIS — Z6841 Body Mass Index (BMI) 40.0 and over, adult: Secondary | ICD-10-CM | POA: Insufficient documentation

## 2022-04-22 DIAGNOSIS — Z87891 Personal history of nicotine dependence: Secondary | ICD-10-CM | POA: Insufficient documentation

## 2022-04-22 DIAGNOSIS — R519 Headache, unspecified: Secondary | ICD-10-CM | POA: Insufficient documentation

## 2022-04-22 DIAGNOSIS — M1990 Unspecified osteoarthritis, unspecified site: Secondary | ICD-10-CM | POA: Diagnosis not present

## 2022-04-22 DIAGNOSIS — I10 Essential (primary) hypertension: Secondary | ICD-10-CM | POA: Insufficient documentation

## 2022-04-22 DIAGNOSIS — G4733 Obstructive sleep apnea (adult) (pediatric): Secondary | ICD-10-CM | POA: Diagnosis not present

## 2022-04-22 DIAGNOSIS — F319 Bipolar disorder, unspecified: Secondary | ICD-10-CM | POA: Diagnosis not present

## 2022-04-22 DIAGNOSIS — F419 Anxiety disorder, unspecified: Secondary | ICD-10-CM | POA: Diagnosis not present

## 2022-04-22 DIAGNOSIS — K439 Ventral hernia without obstruction or gangrene: Secondary | ICD-10-CM | POA: Diagnosis not present

## 2022-04-22 DIAGNOSIS — Z01818 Encounter for other preprocedural examination: Secondary | ICD-10-CM

## 2022-04-22 DIAGNOSIS — K219 Gastro-esophageal reflux disease without esophagitis: Secondary | ICD-10-CM | POA: Diagnosis not present

## 2022-04-22 DIAGNOSIS — Z9989 Dependence on other enabling machines and devices: Secondary | ICD-10-CM | POA: Diagnosis not present

## 2022-04-22 HISTORY — DX: Personal history of other benign neoplasm: Z86.018

## 2022-04-22 HISTORY — PX: EPIGASTRIC HERNIA REPAIR: SHX404

## 2022-04-22 HISTORY — DX: Irritable bowel syndrome with diarrhea: K58.0

## 2022-04-22 HISTORY — DX: Personal history of other diseases of the digestive system: Z87.19

## 2022-04-22 HISTORY — DX: Gastro-esophageal reflux disease without esophagitis: K21.9

## 2022-04-22 HISTORY — DX: Generalized anxiety disorder: F41.1

## 2022-04-22 HISTORY — DX: Ventral hernia without obstruction or gangrene: K43.9

## 2022-04-22 HISTORY — DX: Binge eating disorder: F50.81

## 2022-04-22 HISTORY — DX: Unspecified osteoarthritis, unspecified site: M19.90

## 2022-04-22 HISTORY — DX: Mixed hyperlipidemia: E78.2

## 2022-04-22 HISTORY — DX: Binge eating disorder, unspecified: F50.819

## 2022-04-22 HISTORY — DX: Stress incontinence (female) (male): N39.3

## 2022-04-22 HISTORY — DX: Noninfective gastroenteritis and colitis, unspecified: K52.9

## 2022-04-22 HISTORY — DX: Hyperlipidemia, unspecified: E78.5

## 2022-04-22 LAB — POCT I-STAT, CHEM 8
BUN: 11 mg/dL (ref 6–20)
Calcium, Ion: 1.03 mmol/L — ABNORMAL LOW (ref 1.15–1.40)
Chloride: 107 mmol/L (ref 98–111)
Creatinine, Ser: 0.5 mg/dL (ref 0.44–1.00)
Glucose, Bld: 110 mg/dL — ABNORMAL HIGH (ref 70–99)
HCT: 43 % (ref 36.0–46.0)
Hemoglobin: 14.6 g/dL (ref 12.0–15.0)
Potassium: 3.4 mmol/L — ABNORMAL LOW (ref 3.5–5.1)
Sodium: 142 mmol/L (ref 135–145)
TCO2: 24 mmol/L (ref 22–32)

## 2022-04-22 SURGERY — REPAIR, HERNIA, EPIGASTRIC, ADULT
Anesthesia: General | Site: Abdomen

## 2022-04-22 MED ORDER — BUPIVACAINE LIPOSOME 1.3 % IJ SUSP
INTRAMUSCULAR | Status: AC
  Filled 2022-04-22: qty 20

## 2022-04-22 MED ORDER — FENTANYL CITRATE (PF) 100 MCG/2ML IJ SOLN
25.0000 ug | INTRAMUSCULAR | Status: DC | PRN
  Administered 2022-04-22: 25 ug via INTRAVENOUS

## 2022-04-22 MED ORDER — OXYCODONE HCL 5 MG/5ML PO SOLN: 5.0000 mg | Freq: Once | ORAL | Status: AC | PRN

## 2022-04-22 MED ORDER — LIDOCAINE HCL (CARDIAC) PF 100 MG/5ML IV SOSY
PREFILLED_SYRINGE | INTRAVENOUS | Status: DC | PRN
  Administered 2022-04-22: 100 mg via INTRAVENOUS

## 2022-04-22 MED ORDER — LACTATED RINGERS IV SOLN: INTRAVENOUS | Status: DC

## 2022-04-22 MED ORDER — PROPOFOL 10 MG/ML IV BOLUS
INTRAVENOUS | Status: AC
  Filled 2022-04-22: qty 20

## 2022-04-22 MED ORDER — BUPIVACAINE HCL (PF) 0.5 % IJ SOLN
INTRAMUSCULAR | Status: AC
  Filled 2022-04-22: qty 30

## 2022-04-22 MED ORDER — GABAPENTIN 300 MG PO CAPS
300.0000 mg | ORAL_CAPSULE | ORAL | Status: AC
  Administered 2022-04-22: 300 mg via ORAL

## 2022-04-22 MED ORDER — OXYCODONE-ACETAMINOPHEN 5-325 MG PO TABS: 1.0000 | ORAL_TABLET | ORAL | 0 refills | Status: DC | PRN

## 2022-04-22 MED ORDER — FENTANYL CITRATE (PF) 100 MCG/2ML IJ SOLN
INTRAMUSCULAR | Status: AC
  Filled 2022-04-22: qty 2

## 2022-04-22 MED ORDER — FENTANYL CITRATE (PF) 100 MCG/2ML IJ SOLN
INTRAMUSCULAR | Status: DC | PRN
  Administered 2022-04-22: 100 ug via INTRAVENOUS

## 2022-04-22 MED ORDER — LIDOCAINE HCL (PF) 2 % IJ SOLN
INTRAMUSCULAR | Status: AC
  Filled 2022-04-22: qty 5

## 2022-04-22 MED ORDER — DEXAMETHASONE SODIUM PHOSPHATE 4 MG/ML IJ SOLN
INTRAMUSCULAR | Status: DC | PRN
  Administered 2022-04-22: 5 mg via INTRAVENOUS

## 2022-04-22 MED ORDER — BUPIVACAINE HCL (PF) 0.5 % IJ SOLN
INTRAMUSCULAR | Status: DC | PRN
  Administered 2022-04-22: 30 mL

## 2022-04-22 MED ORDER — KETOROLAC TROMETHAMINE 30 MG/ML IJ SOLN
INTRAMUSCULAR | Status: DC | PRN
  Administered 2022-04-22: 30 mg via INTRAVENOUS

## 2022-04-22 MED ORDER — CHLORHEXIDINE GLUCONATE CLOTH 2 % EX PADS: 6.0000 | MEDICATED_PAD | Freq: Once | CUTANEOUS | Status: DC

## 2022-04-22 MED ORDER — PHENYLEPHRINE HCL (PRESSORS) 10 MG/ML IV SOLN
INTRAVENOUS | Status: DC | PRN
  Administered 2022-04-22 (×6): 80 ug via INTRAVENOUS

## 2022-04-22 MED ORDER — MIDAZOLAM HCL 5 MG/5ML IJ SOLN
INTRAMUSCULAR | Status: DC | PRN
  Administered 2022-04-22: 2 mg via INTRAVENOUS

## 2022-04-22 MED ORDER — ACETAMINOPHEN 500 MG PO TABS: 1000.0000 mg | ORAL_TABLET | ORAL | Status: DC

## 2022-04-22 MED ORDER — PHENYLEPHRINE 80 MCG/ML (10ML) SYRINGE FOR IV PUSH (FOR BLOOD PRESSURE SUPPORT)
PREFILLED_SYRINGE | INTRAVENOUS | Status: AC
  Filled 2022-04-22: qty 10

## 2022-04-22 MED ORDER — SCOPOLAMINE 1 MG/3DAYS TD PT72
MEDICATED_PATCH | TRANSDERMAL | Status: AC
  Filled 2022-04-22: qty 1

## 2022-04-22 MED ORDER — OXYCODONE HCL 5 MG PO TABS
5.0000 mg | ORAL_TABLET | Freq: Once | ORAL | Status: AC | PRN
  Administered 2022-04-22: 5 mg via ORAL

## 2022-04-22 MED ORDER — SCOPOLAMINE 1 MG/3DAYS TD PT72
1.0000 | MEDICATED_PATCH | Freq: Once | TRANSDERMAL | Status: DC
  Administered 2022-04-22: 1.5 mg via TRANSDERMAL

## 2022-04-22 MED ORDER — GABAPENTIN 300 MG PO CAPS
ORAL_CAPSULE | ORAL | Status: AC
  Filled 2022-04-22: qty 1

## 2022-04-22 MED ORDER — ROCURONIUM BROMIDE 10 MG/ML (PF) SYRINGE
PREFILLED_SYRINGE | INTRAVENOUS | Status: AC
  Filled 2022-04-22: qty 10

## 2022-04-22 MED ORDER — AMISULPRIDE (ANTIEMETIC) 5 MG/2ML IV SOLN: 10.0000 mg | Freq: Once | INTRAVENOUS | Status: DC | PRN

## 2022-04-22 MED ORDER — ACETAMINOPHEN 500 MG PO TABS
ORAL_TABLET | ORAL | Status: AC
  Filled 2022-04-22: qty 2

## 2022-04-22 MED ORDER — ONDANSETRON HCL 4 MG/2ML IJ SOLN
INTRAMUSCULAR | Status: AC
  Filled 2022-04-22: qty 2

## 2022-04-22 MED ORDER — ROCURONIUM BROMIDE 100 MG/10ML IV SOLN
INTRAVENOUS | Status: DC | PRN
  Administered 2022-04-22: 60 mg via INTRAVENOUS

## 2022-04-22 MED ORDER — DEXAMETHASONE SODIUM PHOSPHATE 10 MG/ML IJ SOLN
INTRAMUSCULAR | Status: AC
  Filled 2022-04-22: qty 1

## 2022-04-22 MED ORDER — PROPOFOL 10 MG/ML IV BOLUS
INTRAVENOUS | Status: DC | PRN
  Administered 2022-04-22: 200 mg via INTRAVENOUS

## 2022-04-22 MED ORDER — SUGAMMADEX SODIUM 200 MG/2ML IV SOLN
INTRAVENOUS | Status: DC | PRN
  Administered 2022-04-22: 200 mg via INTRAVENOUS

## 2022-04-22 MED ORDER — MIDAZOLAM HCL 2 MG/2ML IJ SOLN
INTRAMUSCULAR | Status: AC
  Filled 2022-04-22: qty 2

## 2022-04-22 MED ORDER — KETOROLAC TROMETHAMINE 30 MG/ML IJ SOLN
INTRAMUSCULAR | Status: AC
  Filled 2022-04-22: qty 1

## 2022-04-22 MED ORDER — OXYCODONE HCL 5 MG PO TABS
ORAL_TABLET | ORAL | Status: AC
  Filled 2022-04-22: qty 1

## 2022-04-22 MED ORDER — 0.9 % SODIUM CHLORIDE (POUR BTL) OPTIME
TOPICAL | Status: DC | PRN
  Administered 2022-04-22: 1000 mL

## 2022-04-22 MED ORDER — VANCOMYCIN HCL 1500 MG/300ML IV SOLN
1500.0000 mg | INTRAVENOUS | Status: AC
  Administered 2022-04-22: 1500 mg via INTRAVENOUS
  Filled 2022-04-22: qty 300

## 2022-04-22 MED ORDER — ONDANSETRON HCL 4 MG/2ML IJ SOLN
INTRAMUSCULAR | Status: DC | PRN
  Administered 2022-04-22: 4 mg via INTRAVENOUS

## 2022-04-22 SURGICAL SUPPLY — 36 items
BLADE CLIPPER SENSICLIP SURGIC (BLADE) IMPLANT
BLADE SURG 15 STRL LF DISP TIS (BLADE) ×1 IMPLANT
BLADE SURG 15 STRL SS (BLADE) ×1
CHLORAPREP W/TINT 26 (MISCELLANEOUS) ×1 IMPLANT
COVER BACK TABLE 60X90IN (DRAPES) ×1 IMPLANT
COVER MAYO STAND STRL (DRAPES) ×1 IMPLANT
DERMABOND ADVANCED .7 DNX12 (GAUZE/BANDAGES/DRESSINGS) IMPLANT
DRAPE LAPAROTOMY 100X72 PEDS (DRAPES) ×1 IMPLANT
DRAPE UTILITY XL STRL (DRAPES) ×1 IMPLANT
ELECT COATED BLADE 2.86 ST (ELECTRODE) ×1 IMPLANT
ELECT REM PT RETURN 9FT ADLT (ELECTROSURGICAL) ×1
ELECTRODE REM PT RTRN 9FT ADLT (ELECTROSURGICAL) ×1 IMPLANT
GAUZE 4X4 16PLY ~~LOC~~+RFID DBL (SPONGE) ×1 IMPLANT
GLOVE BIO SURGEON STRL SZ7.5 (GLOVE) ×1 IMPLANT
GLOVE BIOGEL PI IND STRL 7.5 (GLOVE) ×1 IMPLANT
GLOVE BIOGEL PI IND STRL 8 (GLOVE) ×1 IMPLANT
GOWN STRL REUS W/TWL XL LVL3 (GOWN DISPOSABLE) ×2 IMPLANT
KIT TURNOVER CYSTO (KITS) ×1 IMPLANT
MESH BARD SOFT 3X6IN (Mesh General) IMPLANT
NEEDLE HYPO 22GX1.5 SAFETY (NEEDLE) ×1 IMPLANT
NS IRRIG 1000ML POUR BTL (IV SOLUTION) IMPLANT
PACK BASIN DAY SURGERY FS (CUSTOM PROCEDURE TRAY) ×1 IMPLANT
PENCIL SMOKE EVACUATOR (MISCELLANEOUS) ×1 IMPLANT
SPIKE FLUID TRANSFER (MISCELLANEOUS) IMPLANT
SPONGE T-LAP 4X18 ~~LOC~~+RFID (SPONGE) ×1 IMPLANT
SUT MNCRL AB 4-0 PS2 18 (SUTURE) ×1 IMPLANT
SUT NOVA NAB DX-16 0-1 5-0 T12 (SUTURE) IMPLANT
SUT NOVA NAB GS-21 0 18 T12 DT (SUTURE) IMPLANT
SUT VIC AB 0 SH 27 (SUTURE) IMPLANT
SUT VIC AB 2-0 SH 27 (SUTURE)
SUT VIC AB 2-0 SH 27XBRD (SUTURE) IMPLANT
SUT VIC AB 3-0 SH 27 (SUTURE) ×1
SUT VIC AB 3-0 SH 27X BRD (SUTURE) ×1 IMPLANT
SUT VICRYL AB 3 0 TIES (SUTURE) IMPLANT
SYR CONTROL 10ML LL (SYRINGE) ×1 IMPLANT
TOWEL OR 17X26 10 PK STRL BLUE (TOWEL DISPOSABLE) ×1 IMPLANT

## 2022-04-22 NOTE — Anesthesia Preprocedure Evaluation (Addendum)
Anesthesia Evaluation  Patient identified by MRN, date of birth, ID band Patient awake    Reviewed: Allergy & Precautions, NPO status , Patient's Chart, lab work & pertinent test results  History of Anesthesia Complications Negative for: history of anesthetic complications  Airway Mallampati: II  TM Distance: >3 FB Neck ROM: Full    Dental no notable dental hx.    Pulmonary sleep apnea and Continuous Positive Airway Pressure Ventilation , former smoker   Pulmonary exam normal        Cardiovascular hypertension, Pt. on medications Normal cardiovascular exam     Neuro/Psych  Headaches  Anxiety  Bipolar Disorder      GI/Hepatic Neg liver ROS,GERD  Medicated,,EPIGASTRIC HERNIA   Endo/Other    Morbid obesity (BMI 43)  Renal/GU negative Renal ROS  negative genitourinary   Musculoskeletal  (+) Arthritis ,    Abdominal   Peds  Hematology negative hematology ROS (+)   Anesthesia Other Findings Day of surgery medications reviewed with patient.  Reproductive/Obstetrics negative OB ROS                             Anesthesia Physical Anesthesia Plan  ASA: 3  Anesthesia Plan: General   Post-op Pain Management: Tylenol PO (pre-op)* and Toradol IV (intra-op)*   Induction: Intravenous  PONV Risk Score and Plan: 3 and Treatment may vary due to age or medical condition, Midazolam, Ondansetron, Dexamethasone and Scopolamine patch - Pre-op  Airway Management Planned: Oral ETT  Additional Equipment: None  Intra-op Plan:   Post-operative Plan: Extubation in OR  Informed Consent: I have reviewed the patients History and Physical, chart, labs and discussed the procedure including the risks, benefits and alternatives for the proposed anesthesia with the patient or authorized representative who has indicated his/her understanding and acceptance.     Dental advisory given  Plan Discussed with:  CRNA  Anesthesia Plan Comments:         Anesthesia Quick Evaluation

## 2022-04-22 NOTE — Transfer of Care (Signed)
Immediate Anesthesia Transfer of Care Note  Patient: [name] [facility]  Procedure(s) Performed: Procedure(s) (LRB): OPEN EPIGASTRIC HERNIA REPAIR WITH MESH (N/A)  Patient Location: PACU  Anesthesia Type: General  Level of Consciousness: awake, sedated, patient cooperative and responds to stimulation  Airway & Oxygen Therapy: Patient Spontanous Breathing and Patient connected to [place] oxygen  Post-op Assessment: Report given to PACU RN, Post -op Vital signs reviewed and stable and Patient moving all extremities  Post vital signs: Reviewed and stable  Complications: No apparent anesthesia complications

## 2022-04-22 NOTE — Discharge Instructions (Addendum)
 VENTRAL HERNIA REPAIR POST OPERATIVE INSTRUCTIONS  Thinking Clearly  The anesthesia may cause you to feel different for 1 or 2 days. Do not drive, drink alcohol, or make any big decisions for at least 2 days.  Nutrition When you wake up, you will be able to drink small amounts of liquid. If you do not feel sick, you can slowly advance your diet to regular foods. Continue to drink lots of fluids, usually about 8 to 10 glasses per day. Eat a high-fiber diet so you don't strain during bowel movements. High-Fiber Foods Foods high in fiber include beans, bran cereals and whole-grain breads, peas, dried fruit (figs, apricots, and dates), raspberries, blackberries, strawberries, sweet corn, broccoli, baked potatoes with skin, plums, pears, apples, greens, and nuts. Activity Slowly increase your activity. Be sure to get up and walk every hour or so to prevent blood clots. No heavy lifting or strenuous activity for 4 weeks following surgery to prevent hernias at your incision sites or recurrence of your hernia. It is normal to feel tired. You may need more sleep than usual.  Get your rest but make sure to get up and move around frequently to prevent blood clots and pneumonia.  Work and Return to School You can go back to work when you feel well enough. Discuss the timing with your surgeon. You can usually go back to school or work 1 week or less after an laparoscopic or an open repair. If your work requires heavy lifting or strenuous activity you need to be placed on light duty for 4 weeks following surgery. You can return to gym class, sports or other physical activities 4 weeks after surgery.  Wound Care You may experience significant bruising throughout the abdominal wall that may track down into the groin including into the scrotum in males.  Rest, elevating the groin and scrotum above the level of the heart, ice and compression with tight fitting underwear or an abdominal binder can help.   Always wash your hands before and after touching near your incision site. Do not soak in a bathtub until cleared at your follow up appointment. You may take a shower 24 hours after surgery. A small amount of drainage from the incision is normal. If the drainage is thick and yellow or the site is red, you may have an infection, so call your surgeon. If you have a drain in one of your incisions, it will be taken out in office when the drainage stops. Steri-Strips will fall off in 7 to 10 days or they will be removed during your first office visit. If you have dermabond glue covering over the incision, allow the glue to flake off on its own. Protect the new skin, especially from the sun. The sun can burn and cause darker scarring. Your scar will heal in about 4 to 6 weeks and will become softer and continue to fade over the next year.  The cosmetic appearance of the incisions will improve over the course of the first year after surgery. Sensation around your incision will return in a few weeks or months.  Bowel Movements After intestinal surgery, you may have loose watery stools for several days. If watery diarrhea lasts longer than 3 days, contact your surgeon. Pain medication (narcotics) can cause constipation. Increase the fiber in your diet with high-fiber foods if you are constipated. You can take an over the counter stool softener like Colace to avoid constipation.  Additional over the counter medications can also be used   if Colace isn't sufficient (for example, Milk of Magnesia or Miralax).  Pain The amount of pain is different for each person. Some people need only 1 to 3 doses of pain control medication, while others need more. Take alternating doses of tylenol and ibuprofen around the clock for the first five days following surgery.  This will provide a baseline of pain control and help with inflammation.  Take the narcotic pain medication in addition if needed for severe pain.  Contact  Your Surgeon at [phone], if you have: Pain that will not go away Pain that gets worse A fever of more than 101F (38.3C) Repeated vomiting Swelling, redness, bleeding, or bad-smelling drainage from your wound site Strong abdominal pain No bowel movement or unable to pass gas for 3 days Watery diarrhea lasting longer than 3 days  Pain Control The goal of pain control is to minimize pain, keep you moving and help you heal. Your surgical team will work with you on your pain plan. Most often a combination of therapies and medications are used to control your pain. You may also be given medication (local anesthetic) at the surgical site. This may help control your pain for several days. Extreme pain puts extra stress on your body at a time when your body needs to focus on healing. Do not wait until your pain has reached a level "10" or is unbearable before telling your doctor or nurse. It is much easier to control pain before it becomes severe. Following a laparoscopic procedure, pain is sometimes felt in the shoulder. This is due to the gas inserted into your abdomen during the procedure. Moving and walking helps to decrease the gas and the right shoulder pain.  Use the guide below for ways to manage your post-operative pain. Learn more by going to facs.org/safepaincontrol.  How Intense Is My Pain Common Therapies to Feel Better       I hardly notice my pain, and it does not interfere with my activities.  I notice my pain and it distracts me, but I can still do activities (sitting up, walking, standing).  Non-Medication Therapies  Ice (in a bag, applied over clothing at the surgical site), elevation, rest, meditation, massage, distraction (music, TV, play) walking and mild exercise Splinting the abdomen with pillows +  Non-Opioid Medications Acetaminophen (Tylenol) Non-steroidal anti-inflammatory drugs (NSAIDS) Aspirin, Ibuprofen (Motrin, Advil) Naproxen (Aleve) Take these as  needed, when you feel pain. Both acetaminophen and NSAIDs help to decrease pain and swelling (inflammation).      My pain is hard to ignore and is more noticeable even when I rest.  My pain interferes with my usual activities.  Non-Medication Therapies  +  Non-Opioid medications  Take on a regular schedule (around-the-clock) instead of as needed. (For example, Tylenol every 6 hours at 9:00 am, 3:00 pm, 9:00 pm, 3:00 am and Motrin every 6 hours at 12:00 am, 6:00 am, 12:00 pm, 6:00 pm)         I am focused on my pain, and I am not doing my daily activities.  I am groaning in pain, and I cannot sleep. I am unable to do anything.  My pain is as bad as it could be, and nothing else matters.  Non-Medication Therapies  +  Around-the-Clock Non-Opioid Medications  +  Short-acting opioids  Opioids should be used with other medications to manage severe pain. Opioids block pain and give a feeling of euphoria (feel high). Addiction, a serious side effect of opioids, is   rare with short-term (a few days) use.  Examples of short-acting opioids include: Tramadol (Ultram), Hydrocodone (Norco, Vicodin), Hydromorphone (Dilaudid), Oxycodone (Oxycontin)     The above directions have been adapted from the [employer] Surgical Patient Education Program.  Please refer to the ACS website if needed: [email or web address]   Louanna Raw, MD [facility], [place], [place], [place], [place]  [address] ?  [address], [place], [place]   [address] [phone] ? [phone] ? FAX [phone] Web site: [email or web address]  Post Anesthesia Home Care Instructions  Activity: Get plenty of rest for the remainder of the day. A responsible adult should stay with you for 24 hours following the procedure.  For the next 24 hours, DO NOT: -Drive a car -Conservation officer, nature -Drink alcoholic beverages -Take any medication unless instructed by your physician -Make any legal decisions or sign important papers.  Meals: Start with liquid foods such as gelatin or soup. Progress to regular foods as tolerated. Avoid greasy, spicy, heavy foods. If nausea and/or vomiting occur, drink only clear liquids until the nausea and/or vomiting subsides. Call your physician if vomiting continues.  Special Instructions/Symptoms: Your throat may feel dry or sore from the anesthesia or the breathing tube placed in your throat during surgery. If this causes discomfort, gargle with warm salt water. The discomfort should disappear within 24 hours.      Post Anesthesia Home Care Instructions  Activity: Get plenty of rest for the remainder of the day. A responsible adult should stay with you for 24 hours following the procedure.  For the next 24 hours, DO NOT: -Drive a car -Paediatric nurse -Drink alcoholic beverages -Take any medication unless instructed by your physician -Make any legal decisions or sign important papers.  Meals: Start with liquid foods such as gelatin or soup. Progress to regular foods as tolerated. Avoid greasy, spicy, heavy foods. If nausea and/or vomiting occur, drink only clear liquids until the nausea and/or vomiting subsides. Call your physician if vomiting continues.  Special Instructions/Symptoms: Your throat may feel dry or sore from the anesthesia or the breathing tube placed in your throat during surgery. If this causes discomfort, gargle with warm salt water. The discomfort should disappear within 24 hours.  If you had a scopolamine patch placed behind your ear for the management of post- operative nausea and/or vomiting:  1. The medication in the patch is effective for 72 hours, after which it should be removed.  Wrap patch in a tissue and discard in the trash. Wash hands thoroughly with soap and water. 2. You may remove the patch  earlier than 72 hours if you experience unpleasant side effects which may include dry mouth, dizziness or visual disturbances. 3. Avoid touching the patch. Wash your hands with soap and water after contact with the patch.

## 2022-04-22 NOTE — Op Note (Signed)
Patient: [name] (March 28, 1971, [id number])  Date of Surgery: 04/22/2022   Preoperative Diagnosis: EPIGASTRIC HERNIA   Postoperative Diagnosis: EPIGASTRIC HERNIA   Surgical Procedure: OPEN EPIGASTRIC HERNIA REPAIR WITH MESH:    Operative Team Members:  Surgeon(s) and Role:    * Dimetrius Montfort, Hyman Hopes, MD - Primary   Anesthesiologist: Kaylyn Layer, MD CRNA: Jessica Priest, CRNA   Anesthesia: General   Fluids:  Total I/O In: -  Out: 5 [Blood:5]  Complications: * No complications entered in OR log *  Drains:  none   Specimen: * No specimens in log *   Disposition:  PACU - hemodynamically stable.  Plan of Care: Discharge to home after PACU    Indications for Procedure: [name] is an 51 y.o. female with an epigastric hernia.  I recommended open repair, possibly with mesh.  The procedure, its risks, benefits and alternatives were discussed and the patient granted consent to proceed.   Findings:  Hernia Location: epigastric Hernia Size:  4cm wide x 2 cm tall (2x2 central defect, 1x1 defect to the left of the midline identified while creating preperitoneal space)  Mesh Size &Type:  7.5 cm tall x 8 cm wide Bard Soft mesh Mesh Position: Preperitoneal  Description of Procedure:  The patient was positioned supine, padded and secured to the bed.  The abdomen was widely prepped and draped.  A time out procedure was performed.   A midline vertical incision was made over the area marked in preop consistent with the patient's symptoms. Dissection was carried down through the subcutaneous tissue to the level of the fascia.  The umbilical stalk was encircled and the hernia sac amputated off the umbilical skin.  The hernia defect was measured as 2 cm wide by 2 cm in vertical dimension.  Later in the dissection a 1x1 cm defect was noted just to the left of this with a total hernia area of 2cm tall x 4 cm wide  A hernia repair with mesh was performed.  The hernia  sac was scored along the perimeter of the fascial defect, entering the pre peritoneal plane.  A wide pre peritoneal dissection was performed creating a space for mesh placement.  The 1cm x 1 cm defect was closed with 1 novafil suture from underneath, working in the preperitoneal plane.  A piece of bard soft was opened and trimmed to 7.5 cm tall x 8 cm wide and deployed into the pre peritoneal space.  The mesh laid in taut position in the pre peritoneal plane and did not require fixation.  The space was irrigated with normal saline.  The fascial defect was closed horizontally, overtop the mesh with four figure of eight 1 novafil suture.  The skin was closed with 4-0 Monocryl subcuticular suture and skin glue.     Ivar Drape, MD General, Bariatric, & Minimally Invasive Surgery [facility], [place]

## 2022-04-22 NOTE — Anesthesia Procedure Notes (Signed)
Procedure Name: Intubation Date/Time: 04/22/2022 7:49 AM  Performed by: Justice Rocher, CRNAPre-anesthesia Checklist: Patient identified, Emergency Drugs available, Suction available, Patient being monitored and Timeout performed Patient Re-evaluated:Patient Re-evaluated prior to induction Oxygen Delivery Method: Circle system utilized Preoxygenation: Pre-oxygenation with 100% oxygen Induction Type: IV induction Ventilation: Mask ventilation without difficulty Laryngoscope Size: Mac and 4 Grade View: Grade II Tube type: Oral Tube size: 7.0 mm Number of attempts: 1 Airway Equipment and Method: Stylet and Oral airway Placement Confirmation: ETT inserted through vocal cords under direct vision, positive ETCO2, breath sounds checked- equal and bilateral and CO2 detector Secured at: 23 cm Tube secured with: Tape Dental Injury: Teeth and Oropharynx as per pre-operative assessment

## 2022-04-22 NOTE — H&P (Signed)
Admitting Physician: Nickola Major Damauri Minion  Service: General Surgery  CC: hernia  Subjective   HPI: [name] is an 51 y.o. female who is here for epigastric hernia repair  Past Medical History:  Diagnosis Date   ADHD    B12 deficiency    Binge eating disorder    Bipolar disease, chronic (HCC)    Chronic diarrhea    Endometriosis    Epigastric hernia    Fatty liver    followed by dr Havery Moros   (last abd ultrasound in epic 02-11-2022)   GAD (generalized anxiety disorder)    GERD (gastroesophageal reflux disease)    Hemorrhoids    History of anal fissures    History of cancer chemotherapy    FOR CERVICAL CANCER , COMPLETED 08-2013   History of cervical cancer in adulthood 04/2013   gyn oncologist--- dr Craig Guess;   dx 12/ 2014 invasive cervical cancer--- Stage IB2--- 06-11-2013  s/p  TAH w/ BSO and pelvic node dissection,  completed chemoradiation 04/ 2015   History of colitis 06/24/2019   History of radiation therapy    FOR CERVICAL CANCER , COMPLETED 08-2013   History of uterine fibroid    Hyperlipidemia, mixed    Hypertension    Irritable bowel syndrome with diarrhea    GI--  dr Havery Moros   Lactose intolerance    Migraines    Nodule of upper lobe of left lung    last CT chest 05-15-2017 (care everywhere in epic)    OA (osteoarthritis)    OSA on CPAP    study in epic 02-24-2015  mild osa,  uses cpap nightly,  followed by [facility]   Prediabetes    SOB (shortness of breath)    [name] (stress urinary incontinence, female)    Vitamin D deficiency     Past Surgical History:  Procedure Laterality Date   COLONOSCOPY  03/2017   dr Penelope Coop   EVALUATION UNDER ANESTHESIA WITH ANAL FISTULECTOMY N/A 07/06/2017   Procedure: ANAL EXAM UNDER ANESTHESIA;  Surgeon: Leighton Ruff, MD;  Location: [facility];  Service: General;  Laterality: N/A;   EXCISION OF BREAST BIOPSY Right    2010 and 2012   (both benign)   INCISION AND DRAINAGE PERIRECTAL  ABSCESS N/A 04/26/2017   Procedure: IRRIGATION AND DEBRIDEMENT PERIRECTAL ABSCESS;  Surgeon: Johnathan Hausen, MD;  Location: [facility];  Service: General;  Laterality: N/A;   LAPAROSCOPY INCISIONAL HERNIA REPAIR W/ MESH, LYSIS ADHESIONS  01/10/2014   '@[facility]'$    LIGATION OF INTERNAL FISTULA TRACT N/A 03/08/2018   Procedure: LIGATION OF INTERNAL FISTULA TRACT;  Surgeon: Leighton Ruff, MD;  Location: [facility];  Service: General;  Laterality: N/A;   PLACEMENT OF SETON N/A 07/06/2017   Procedure: PLACEMENT OF SETON;  Surgeon: Leighton Ruff, MD;  Location: [facility];  Service: General;  Laterality: N/A;   TOTAL ABDOMINAL HYSTERECTOMY W/ BILATERAL SALPINGOOPHORECTOMY  06/11/2013   '@[facility]'$  by dr Rayford Halsted;     W/ PELVIC [place] ARTHROPLASTY Left 04/29/2019   Procedure: TOTAL KNEE ARTHROPLASTY;  Surgeon: Gaynelle Arabian, MD;  Location: [facility];  Service: Orthopedics;  Laterality: Left;  46mn   TOTAL KNEE ARTHROPLASTY Right 01/2020   '@[facility]'$  by dr aWynelle Link  WISDOM TOOTH EXTRACTION      Family History  Problem Relation Age of Onset   Hyperlipidemia Mother    Hypertension Mother    Diabetes Father    Irritable bowel  syndrome Father    Bladder Cancer Father 54   Hypertension Father    Hyperlipidemia Father    Cancer Father    Obesity Father    Diabetes Maternal Grandfather    Stroke Maternal Grandfather    Heart disease Maternal Grandfather        pacemaker    Colon cancer Paternal Uncle        ?   Pancreatic cancer Neg Hx    Stomach cancer Neg Hx    Esophageal cancer Neg Hx     Social:  reports that she quit smoking about 24 years ago. Her smoking use included cigarettes. She has never used smokeless tobacco. She reports that she does not currently use alcohol. She reports that she does not use drugs.  Allergies:  Allergies  Allergen Reactions   Amoxicillin Rash    Has patient had a PCN reaction causing immediate rash,  facial/tongue/throat swelling, SOB or lightheadedness with hypotension: Unknown Has patient had a PCN reaction causing severe rash involving mucus membranes or skin necrosis: Yes Has patient had a PCN reaction that required hospitalization: No Has patient had a PCN reaction occurring within the last 10 years: No If all of the above answers are "NO", then may proceed with Cephalosporin use.    Penicillins Rash    Has patient had a PCN reaction causing immediate rash, facial/tongue/throat swelling, SOB or lightheadedness with hypotension: Unknown Has patient had a PCN reaction causing severe rash involving mucus membranes or skin necrosis: Yes Has patient had a PCN reaction that required hospitalization: No Has patient had a PCN reaction occurring within the last 10 years: No If all of the above answers are "NO", then may proceed with Cephalosporin use.    Sulfa Antibiotics Rash    Medications: Current Outpatient Medications  Medication Instructions   acetaminophen (TYLENOL) 1,000 mg, Oral, Every 6 hours PRN   amLODipine (NORVASC) 5 MG tablet TAKE 1 TABLET(5 MG) BY MOUTH DAILY   aspirin-acetaminophen-caffeine (EXCEDRIN MIGRAINE) 250-250-65 MG tablet 1-2 tablets, Oral, Every 6 hours PRN   atorvastatin (LIPITOR) 20 mg, Oral, Daily   AZO-CRANBERRY PO 2 tablets, Oral, Every other day   chlorproMAZINE (THORAZINE) 25 mg, Oral, Daily PRN   Cholecalciferol (VITAMIN D-3) 25 MCG (1000 UT) CAPS 1 capsule, Oral, Daily at bedtime   colestipol (COLESTID) 1 g, Oral, 2 times daily PRN   famotidine (PEPCID) 10-20 mg, Oral, Daily, May increase to twice daily as needed   lamoTRIgine (LAMICTAL) 200 mg, Oral, Daily   lamoTRIgine (LAMICTAL) 25 mg, Oral, Daily at bedtime   Loperamide HCl (IMODIUM A-D PO) 1 tablet, Oral, As needed   Multiple Vitamin (MULTIVITAMIN WITH MINERALS) TABS tablet 1 tablet, Oral, Daily at bedtime   naphazoline-glycerin (CLEAR EYES REDNESS) 0.012-0.25 % SOLN 1-2 drops, As needed    Polyethyl Glycol-Propyl Glycol (SYSTANE OP) 1 drop, Ophthalmic, Daily PRN   rizatriptan (MAXALT) 10 mg, Oral, As needed    ROS - all of the below systems have been reviewed with the patient and positives are indicated with bold text General: chills, fever or night sweats Eyes: blurry vision or double vision ENT: epistaxis or sore throat Allergy/Immunology: itchy/watery eyes or nasal congestion Hematologic/Lymphatic: bleeding problems, blood clots or swollen lymph nodes Endocrine: temperature intolerance or unexpected weight changes Breast: new or changing breast lumps or nipple discharge Resp: cough, shortness of breath, or wheezing CV: chest pain or dyspnea on exertion GI: as per HPI GU: dysuria, trouble voiding, or hematuria MSK: joint pain or  joint stiffness Neuro: TIA or stroke symptoms Derm: pruritus and skin lesion changes Psych: anxiety and depression  Objective   PE Blood pressure (!) 177/89, pulse 78, temperature 98.1 F (36.7 C), temperature source Oral, resp. rate 17, height '5\' 5"'$  (1.651 m), weight 117.8 kg, SpO2 96 %. Constitutional: NAD; conversant; no deformities Eyes: Moist conjunctiva; no lid lag; anicteric; PERRL Neck: Trachea midline; no thyromegaly Lungs: Normal respiratory effort; no tactile fremitus CV: RRR; no palpable thrills; no pitting edema GI: Abd epigastric hernia marked in preoperative area; no palpable hepatosplenomegaly MSK: Normal range of motion of extremities; no clubbing/cyanosis Psychiatric: Appropriate affect; alert and oriented x3 Lymphatic: No palpable cervical or axillary lymphadenopathy  Results for orders placed or performed during the hospital encounter of 04/22/22 (from the past 24 hour(s))  I-STAT, chem 8     Status: Abnormal   Collection Time: 04/22/22  6:43 AM  Result Value Ref Range   Sodium 142 135 - 145 mmol/L   Potassium 3.4 (L) 3.5 - 5.1 mmol/L   Chloride 107 98 - 111 mmol/L   BUN 11 6 - 20 mg/dL   Creatinine, Ser 0.50  0.44 - 1.00 mg/dL   Glucose, Bld 110 (H) 70 - 99 mg/dL   Calcium, Ion 1.03 (L) 1.15 - 1.40 mmol/L   TCO2 24 22 - 32 mmol/L   Hemoglobin 14.6 12.0 - 15.0 g/dL   HCT 43.0 36.0 - 46.0 %    Imaging Orders  No imaging studies ordered today     Assessment and Plan   [name] is an 51 y.o. female with an epigastric hernia.  I recommended open repair, possibly with mesh.  The procedure, its risks, benefits and alternatives were discussed and the patient granted consent to proceed.    ICD-10-CM   1. Pre-op testing  Z01.818 CBG per Guidelines for Diabetes Management for Patients Undergoing Surgery ([facility], [facility], and [facility] only)    CBG per protocol    I-Stat, Chem 8 on day of surgery per protocol    CBG per Guidelines for Diabetes Management for Patients Undergoing Surgery ([facility], [facility], and [facility] only)    CBG per protocol    I-Stat, Chem 8 on day of surgery per protocol       Felicie Morn, MD  [facility] Use [email or web address] to contact on call provider

## 2022-04-22 NOTE — Anesthesia Postprocedure Evaluation (Signed)
Anesthesia Post Note  Patient: [name] [facility]  Procedure(s) Performed: OPEN EPIGASTRIC HERNIA REPAIR WITH MESH (Abdomen)     Patient location during evaluation: PACU Anesthesia Type: General Level of consciousness: awake and alert Pain management: pain level controlled Vital Signs Assessment: post-procedure vital signs reviewed and stable Respiratory status: spontaneous breathing, nonlabored ventilation and respiratory function stable Cardiovascular status: blood pressure returned to baseline Postop Assessment: no apparent nausea or vomiting Anesthetic complications: no   No notable events documented.  Last Vitals:  Vitals:   04/22/22 0915 04/22/22 0930  BP: 137/76 (!) 143/90  Pulse: 74 78  Resp: (!) 25 (!) 24  Temp:    SpO2: 95% 94%    Last Pain:  Vitals:   04/22/22 0915  TempSrc:   PainSc: [place]

## 2022-04-25 ENCOUNTER — Encounter (HOSPITAL_BASED_OUTPATIENT_CLINIC_OR_DEPARTMENT_OTHER): Payer: Self-pay | Admitting: Surgery

## 2022-04-25 DIAGNOSIS — G4733 Obstructive sleep apnea (adult) (pediatric): Secondary | ICD-10-CM | POA: Diagnosis not present

## 2022-04-25 DIAGNOSIS — G4710 Hypersomnia, unspecified: Secondary | ICD-10-CM | POA: Diagnosis not present

## 2022-04-27 DIAGNOSIS — Z1339 Encounter for screening examination for other mental health and behavioral disorders: Secondary | ICD-10-CM | POA: Diagnosis not present

## 2022-04-27 DIAGNOSIS — Z1331 Encounter for screening for depression: Secondary | ICD-10-CM | POA: Diagnosis not present

## 2022-04-27 DIAGNOSIS — N3000 Acute cystitis without hematuria: Secondary | ICD-10-CM | POA: Diagnosis not present

## 2022-05-02 ENCOUNTER — Encounter: Payer: Self-pay | Admitting: Nurse Practitioner

## 2022-05-02 ENCOUNTER — Telehealth: Payer: BC Managed Care – PPO | Admitting: Nurse Practitioner

## 2022-05-02 DIAGNOSIS — G4733 Obstructive sleep apnea (adult) (pediatric): Secondary | ICD-10-CM

## 2022-05-02 DIAGNOSIS — U071 COVID-19: Secondary | ICD-10-CM

## 2022-05-02 MED ORDER — HYDROCODONE BIT-HOMATROP MBR 5-1.5 MG/5ML PO SOLN: 5.0000 mL | Freq: Every evening | ORAL | 0 refills | Status: DC | PRN

## 2022-05-02 MED ORDER — AMBULATORY NON FORMULARY MEDICATION: 1 refills | Status: AC

## 2022-05-02 MED ORDER — BENZONATATE 200 MG PO CAPS: 200.0000 mg | ORAL_CAPSULE | Freq: Three times a day (TID) | ORAL | 1 refills | Status: DC | PRN

## 2022-05-02 NOTE — Progress Notes (Signed)
Virtual Visit Encounter telephone visit.   I connected with  [name] on 05/02/22 at 10:45 AM EST by secure audio telemedicine application. I verified that I am speaking with the correct person using two identifiers.   I introduced myself as a Designer, jewellery with the practice. The limitations of evaluation and management by telemedicine discussed with the patient and the availability of in person appointments. The patient expressed verbal understanding and consent to proceed.  Participating parties in this visit include: Myself and patient  The patient is: Patient Location: Home I am: Provider Location: Office/Clinic Subjective:    CC and HPI: [name] is a 51 y.o. year old female presenting for follow up of OSA and to discuss COVID sx. Patient reports the following: OSA [name] endorses using a CPAP machine for the past 7 years . She called [facility] to see about getting a new one, but they told her that she needed a prescription/authorization from her provider. She is doing well with the machine and uses it nightly. She currently has the [place]. She is using a mask that covers the nose and the mouth. This is working well for her. She tells me she has become more of a mouth breather in the recent years.   COVID She has a recent diagnosis of COVID. She is doing well with the exception of loss of taste and burning tongue along with severe cough. She had hernia surgery on 12/8 and is concerned about the grequency and intensity of her cough and would like to know if there is something she can be prescribed to help.  [name] fax: [phone]    Past medical history, Surgical history, Family history not pertinant except as noted below, Social history, Allergies, and medications have been entered into the medical record, reviewed, and corrections made.   Review of Systems:  All review of systems negative except what is listed in the HPI  Objective:     Alert and oriented x 4 Audibly congested with cough Speaking in clear sentences with no shortness of breath. No distress.  Impression and Recommendations:    Problem List Items Addressed This Visit     OSA (obstructive sleep apnea)    Patient doing well with CPAP on autoadjust. Will fax new prescription for full face mask and machine to [facility].       COVID-19 - Primary    Doing fairly well with diagnosis with exception of significant cough. Given that she has recently had hernia surgery, we will aggressively treat her cough to prevent complications. No alarm sx present today. Hycodan for night time and Tessalon for day sent to pharmacy. Recommend mucinex, rest, and increased hydration. F/U if sx worsen or fail to improve.       Relevant Medications   benzonatate (TESSALON) 200 MG capsule   HYDROcodone bit-homatropine (HYCODAN) 5-1.5 MG/5ML syrup    orders and follow up as documented in EMR I discussed the assessment and treatment plan with the patient. The patient was provided an opportunity to ask questions and all were answered. The patient agreed with the plan and demonstrated an understanding of the instructions.   The patient was advised to call back or seek an in-person evaluation if the symptoms worsen or if the condition fails to improve as anticipated.  Follow-Up: in 6 months  I provided 21 minutes of non-face-to-face interaction with this non face-to-face encounter including intake, same-day documentation, and chart review.   Orma Render, NP ,  DNP, AGNP-c [place] [facility] [phone] (fax)

## 2022-05-02 NOTE — Assessment & Plan Note (Signed)
Doing fairly well with diagnosis with exception of significant cough. Given that she has recently had hernia surgery, we will aggressively treat her cough to prevent complications. No alarm sx present today. Hycodan for night time and Tessalon for day sent to pharmacy. Recommend mucinex, rest, and increased hydration. F/U if sx worsen or fail to improve.

## 2022-05-02 NOTE — Assessment & Plan Note (Signed)
Patient doing well with CPAP on autoadjust. Will fax new prescription for full face mask and machine to [facility].

## 2022-05-04 ENCOUNTER — Encounter: Payer: BC Managed Care – PPO | Admitting: Physical Therapy

## 2022-05-05 ENCOUNTER — Telehealth (INDEPENDENT_AMBULATORY_CARE_PROVIDER_SITE_OTHER): Payer: BC Managed Care – PPO | Admitting: Family Medicine

## 2022-05-05 ENCOUNTER — Encounter: Payer: Self-pay | Admitting: Family Medicine

## 2022-05-05 VITALS — Temp 98.0°F | Ht 65.0 in | Wt 259.2 lb

## 2022-05-05 DIAGNOSIS — U071 COVID-19: Secondary | ICD-10-CM

## 2022-05-05 DIAGNOSIS — N3000 Acute cystitis without hematuria: Secondary | ICD-10-CM | POA: Diagnosis not present

## 2022-05-05 MED ORDER — NITROFURANTOIN MONOHYD MACRO 100 MG PO CAPS: 100.0000 mg | ORAL_CAPSULE | Freq: Two times a day (BID) | ORAL | 0 refills | Status: DC

## 2022-05-05 NOTE — Patient Instructions (Signed)
Stay well hydrated. Take the antibiotics twice daily. Contact us if your symptoms worsen or do not improve within the next 2-3 days.   Contact us if your symptoms haven't completley resolved by the 5 days. Contact us if you develop fever, nausea, vomiting, flank/back pain, blood in the urine or worsening abdominal pain.

## 2022-05-05 NOTE — Progress Notes (Signed)
Start time: 3:31 End time: 3:44  Virtual Visit via Video Note  I connected with [name] on 05/05/22 by a video enabled telemedicine application and verified that I am speaking with the correct person using two identifiers.  Location: Patient: home Provider: office   I discussed the limitations of evaluation and management by telemedicine and the availability of in person appointments. The patient expressed understanding and agreed to proceed.  History of Present Illness:  Chief Complaint  Patient presents with   Urinary Frequency    VIRTUAL burning with urination as well as frequency that began yesterday. Has covid so she cannot come in. Tested positive last Friday.    She had hernia surgery was 12/8. Tested + for COVID on Friday 12/15.   She had cough, no fever. Burning tongue, loss of taste and smell. She is feeling much better now.   She wasn't seen for a visit, did home test; wasn't able to drive.  Burning with urination started yesterday. Today she is having urinary urgency and frequency. This feels similar to prior UTI's. Last UTI that she recalls was in 2019.  She has IBS, produces a lot of stool.  She stopped taking her colestipol, AND was taking miralax to avoid straining after hernia surgery, and so has been having diarrhea.   PMH, PSH, SH reviewed  Outpatient Encounter Medications as of 05/05/2022  Medication Sig Note   AMBULATORY NON FORMULARY MEDICATION Continuous positive airway pressure (CPAP) device: Auto titrate to maximum of 20 cm H2O with pressure. Please provide all supplemental supplies as needed. Facemask preferred.    amLODipine (NORVASC) 5 MG tablet TAKE 1 TABLET(5 MG) BY MOUTH DAILY (Patient taking differently: Take 5 mg by mouth daily. TAKE 1 TABLET(5 MG) BY MOUTH DAILY)    atorvastatin (LIPITOR) 20 MG tablet Take 1 tablet (20 mg total) by mouth daily. (Patient taking differently: Take 20 mg by mouth at bedtime.)    AZO-CRANBERRY PO Take 2  tablets by mouth every other day. 04/20/2022: .   benzonatate (TESSALON) 200 MG capsule Take 1 capsule (200 mg total) by mouth 3 (three) times daily as needed for cough. 05/05/2022: Took one this am   Cholecalciferol (VITAMIN D-3) 25 MCG (1000 UT) CAPS Take 1 capsule by mouth at bedtime.    colestipol (COLESTID) 1 g tablet Take 1 g by mouth 2 (two) times daily as needed (chronic diarrhea).    ibuprofen (ADVIL) 200 MG tablet Take 400 mg by mouth every 6 (six) hours as needed. 05/05/2022: Last dose 3pm   lamoTRIgine (LAMICTAL) 200 MG tablet Take 200 mg by mouth daily.    lamoTRIgine (LAMICTAL) 25 MG CHEW chewable tablet Chew 25 mg by mouth at bedtime.    Multiple Vitamin (MULTIVITAMIN WITH MINERALS) TABS tablet Take 1 tablet by mouth at bedtime.    acetaminophen (TYLENOL) 500 MG tablet Take 1,000 mg by mouth every 6 (six) hours as needed for mild pain or moderate pain. (Patient not taking: Reported on 05/05/2022) 05/05/2022: prn   aspirin-acetaminophen-caffeine (EXCEDRIN MIGRAINE) 250-250-65 MG tablet Take 1-2 tablets by mouth every 6 (six) hours as needed for headache. (Patient not taking: Reported on 05/05/2022) 05/05/2022: prn   chlorproMAZINE (THORAZINE) 25 MG tablet Take 25 mg by mouth daily as needed (migraine headaches). (Patient not taking: Reported on 05/05/2022) 05/05/2022: prn   famotidine (PEPCID) 10 MG tablet Take 1-2 tablets (10-20 mg total) by mouth daily. May increase to twice daily as needed (Patient not taking: Reported on 05/05/2022) 05/05/2022: prn  HYDROcodone bit-homatropine (HYCODAN) 5-1.5 MG/5ML syrup Take 5 mLs by mouth at bedtime as needed for cough. (Patient not taking: Reported on 05/05/2022)    Loperamide HCl (IMODIUM A-D PO) Take 1 tablet by mouth as needed. (Patient not taking: Reported on 05/05/2022) 05/05/2022: prn   naphazoline-glycerin (CLEAR EYES REDNESS) 0.012-0.25 % SOLN 1-2 drops as needed for eye irritation. (Patient not taking: Reported on 05/05/2022) 05/05/2022:  prn   Polyethyl Glycol-Propyl Glycol (SYSTANE OP) Apply 1 drop to eye daily as needed (dry/irritated eyes). (Patient not taking: Reported on 05/05/2022) 05/05/2022: prn   rizatriptan (MAXALT) 10 MG tablet Take 10 mg by mouth as needed for migraine. (Patient not taking: Reported on 05/05/2022) 05/05/2022: prn   No facility-administered encounter medications on file as of 05/05/2022.   Allergies  Allergen Reactions   Penicillins Rash    Has patient had a PCN reaction causing immediate rash, facial/tongue/throat swelling, SOB or lightheadedness with hypotension: Unknown  Has patient had a PCN reaction causing severe rash involving mucus membranes or skin necrosis: Yes  Has patient had a PCN reaction that required hospitalization: No  Has patient had a PCN reaction occurring within the last 10 years: No  If all of the above answers are "NO", then may proceed with Cephalosporin use.  Has patient had a PCN reaction causing immediate rash, facial/tongue/throat swelling, SOB or lightheadedness with hypotension: Unknown Has patient had a PCN reaction causing severe rash involving mucus membranes or skin necrosis: Yes Has patient had a PCN reaction that required hospitalization: No Has patient had a PCN reaction occurring within the last 10 years: No If all of the above answers are "NO", then may proceed with Cephalosporin use.    rash   Sulfa Antibiotics Rash   Penicillamine Other (See Comments)    ROS: No f/c, no flank pain or abdominal pain. No nausea or vomiting. No vaginal discharge. URI are improving. No chest pain, shortness of breath, leg swelling.   Observations/Objective:  Temp 98 F (36.7 C) (Oral) Comment: 98.0  Ht '5\' 5"'$  (1.651 m)   Wt 259 lb 3.2 oz (117.6 kg)   BMI 43.13 kg/m   Well-appearing, pleasant female. She is alert and oriented.  She doesn't sound congested, no coughing. Exam is limited due to the virtual nature of the visit.   Assessment and Plan:  Acute  cystitis without hematuria - treating presumptively with macrobid (no urine sample, virtual visit). To f/u if sx persist/worsen, esp if fever, flank pain, vomiting - Plan: nitrofurantoin, macrocrystal-monohydrate, (MACROBID) 100 MG capsule  COVID-19 virus infection - improving without treatment  Stay well hydrated. Take the antibiotics twice daily. Contact us if your symptoms worsen or do not improve within the next 2-3 days.   Contact us if your symptoms haven't completley resolved by the 5 days. Contact us if you develop fever, nausea, vomiting, flank/back pain, blood in the urine or worsening abdominal pain.    Follow Up Instructions:    I discussed the assessment and treatment plan with the patient. The patient was provided an opportunity to ask questions and all were answered. The patient agreed with the plan and demonstrated an understanding of the instructions.   The patient was advised to call back or seek an in-person evaluation if the symptoms worsen or if the condition fails to improve as anticipated.  I spent 20 minutes dedicated to the care of this patient, including pre-visit review of records, face to face time, post-visit ordering of testing and documentation.  Vikki Ports, MD

## 2022-05-06 DIAGNOSIS — F3162 Bipolar disorder, current episode mixed, moderate: Secondary | ICD-10-CM | POA: Diagnosis not present

## 2022-05-06 DIAGNOSIS — F423 Hoarding disorder: Secondary | ICD-10-CM | POA: Diagnosis not present

## 2022-05-06 DIAGNOSIS — F411 Generalized anxiety disorder: Secondary | ICD-10-CM | POA: Diagnosis not present

## 2022-05-06 DIAGNOSIS — F4312 Post-traumatic stress disorder, chronic: Secondary | ICD-10-CM | POA: Diagnosis not present

## 2022-05-11 ENCOUNTER — Encounter: Payer: BC Managed Care – PPO | Admitting: Physical Therapy

## 2022-05-11 DIAGNOSIS — L918 Other hypertrophic disorders of the skin: Secondary | ICD-10-CM | POA: Diagnosis not present

## 2022-05-11 DIAGNOSIS — L720 Epidermal cyst: Secondary | ICD-10-CM | POA: Diagnosis not present

## 2022-05-12 DIAGNOSIS — Z9889 Other specified postprocedural states: Secondary | ICD-10-CM | POA: Diagnosis not present

## 2022-05-12 DIAGNOSIS — K439 Ventral hernia without obstruction or gangrene: Secondary | ICD-10-CM | POA: Diagnosis not present

## 2022-05-18 ENCOUNTER — Encounter: Payer: BC Managed Care – PPO | Admitting: Physical Therapy

## 2022-05-25 ENCOUNTER — Encounter: Payer: BC Managed Care – PPO | Admitting: Physical Therapy

## 2022-05-26 DIAGNOSIS — L918 Other hypertrophic disorders of the skin: Secondary | ICD-10-CM | POA: Diagnosis not present

## 2022-06-01 ENCOUNTER — Ambulatory Visit: Payer: BC Managed Care – PPO | Admitting: Family Medicine

## 2022-06-01 ENCOUNTER — Telehealth: Payer: Self-pay

## 2022-06-01 ENCOUNTER — Encounter: Payer: Self-pay | Admitting: Family Medicine

## 2022-06-01 VITALS — BP 130/82 | HR 84 | Temp 98.6°F | Ht 65.0 in | Wt 263.2 lb

## 2022-06-01 DIAGNOSIS — R350 Frequency of micturition: Secondary | ICD-10-CM

## 2022-06-01 LAB — POCT URINALYSIS DIP (PROADVANTAGE DEVICE)
Blood, UA: NEGATIVE
Glucose, UA: NEGATIVE mg/dL
Ketones, POC UA: NEGATIVE mg/dL
Nitrite, UA: POSITIVE — AB
Protein Ur, POC: 30 mg/dL — AB
Specific Gravity, Urine: 1.02
Urobilinogen, Ur: 0.2
pH, UA: 6 (ref 5.0–8.0)

## 2022-06-01 NOTE — Patient Outreach (Signed)
  Care Coordination   Initial Visit Note   06/01/2022 Name: [name] MRN: [id number] DOB: 14-Jan-1971  [name] is a 52 y.o. year old female who sees Early, Coralee Pesa, NP for primary care. I spoke with  [name] by phone today.  What matters to the patients health and wellness today?  Ms. [name] denies any care coordination, disease management or resource needs at this time. [name] to contact RNCM if care coordination needs in the future.    Goals Addressed             This Visit's Progress    COMPLETED: Care Coordination activities-no follow up required       Care Coordination Interventions: Discussed care coordination program Provided contact number for [name], [name]([phone]). Encouraged to contact if care coordination needs in the future.        SDOH assessments and interventions completed:  Yes  SDOH Interventions Today    Flowsheet Row Most Recent Value  SDOH Interventions   Food Insecurity Interventions Intervention Not Indicated  Housing Interventions Intervention Not Indicated  Transportation Interventions Intervention Not Indicated  Utilities Interventions Intervention Not Indicated     Care Coordination Interventions:  Yes, provided   Follow up plan: No further intervention required.   Encounter Outcome:  Pt. Visit Completed   Thea Silversmith, RN, MSN, BSN, [place] Coordinator [phone]

## 2022-06-01 NOTE — Progress Notes (Signed)
Chief Complaint  Patient presents with   Urinary Frequency    Has been having urinary frequency. Seen virtually by you 05/05/22. Also did another virtual with a national 1-800 # doctor and was given 5 days of cipro, did get better. Two days ago urgency, frequency and pain began again. She also thinks she has a yeast infection, outer vaginal pain-no itching. Thinks it may be from the two rounds of abx. Used 3 day Monistat (only used 2 days worth).    She presents with 2 days of urgency, frequency and pain with voiding.  She has some vaginal pain even when not voiding.  She had some lower abdominal/pelvic pain last night, none currently. She took AZO yesterday, which helped a lot. Hasn't taken any today. Today she denies any urinary frequency.  She has been treated for 2 UTI's in the last month, both visits were virtual (1 here, one elsewhere), so no urine samples were collected, treated presumptively.   She was treated with macrobid 12/21 by me (done virtually due to her having COVID), and treated with cipro around 1/1 for a 5 day course elsewhere.  She has been off ABX for about 10 days.  She had some pain on the outside of the vagina, slight itching only. She denies any vaginal discharge. She used 2 days of Monistat (3 day course), none yesterday (using both external cream and vaginal applicator). The external pain and vaginal pain resolved with the monistat.  She has IBS with diarrhea.  Colestipol helps. Denies any watery stool or potential contamination of the urethral area, practices good hygiene.  PMH, PSH, SH reviewed  She tells me of negative PET scan done recently (Nov)  Outpatient Encounter Medications as of 06/01/2022  Medication Sig Note   AMBULATORY NON FORMULARY MEDICATION Continuous positive airway pressure (CPAP) device: Auto titrate to maximum of 20 cm H2O with pressure. Please provide all supplemental supplies as needed. Facemask preferred.    amLODipine (NORVASC) 5 MG tablet  TAKE 1 TABLET(5 MG) BY MOUTH DAILY (Patient taking differently: Take 5 mg by mouth daily. TAKE 1 TABLET(5 MG) BY MOUTH DAILY)    amphetamine-dextroamphetamine (ADDERALL XR) 10 MG 24 hr capsule Take 10 mg by mouth daily.    aspirin-acetaminophen-caffeine (EXCEDRIN MIGRAINE) 250-250-65 MG tablet Take 1 tablet by mouth every 6 (six) hours as needed for headache. 06/01/2022: Took one last night 7pm   atorvastatin (LIPITOR) 20 MG tablet Take 1 tablet (20 mg total) by mouth daily. (Patient taking differently: Take 20 mg by mouth at bedtime.)    AZO-CRANBERRY PO Take 2 tablets by mouth every other day. 04/20/2022: .   BIOTIN 5000 PO Take 1 capsule by mouth daily.    Cholecalciferol (VITAMIN D-3) 25 MCG (1000 UT) CAPS Take 1 capsule by mouth at bedtime.    colestipol (COLESTID) 1 g tablet Take 1 g by mouth 2 (two) times daily as needed (chronic diarrhea).    lamoTRIgine (LAMICTAL) 200 MG tablet Take 200 mg by mouth daily.    lamoTRIgine (LAMICTAL) 25 MG CHEW chewable tablet Chew 25 mg by mouth at bedtime.    miconazole (MICOTIN) 200 MG vaginal suppository Place 200 mg vaginally at bedtime.    Multiple Vitamin (MULTIVITAMIN WITH MINERALS) TABS tablet Take 1 tablet by mouth at bedtime.    Vilazodone HCl (VIIBRYD) 10 MG TABS Take 10 mg by mouth daily.    [DISCONTINUED] aspirin-acetaminophen-caffeine (EXCEDRIN MIGRAINE) 250-250-65 MG tablet Take 1-2 tablets by mouth every 6 (six) hours as needed for  headache. 05/05/2022: prn   acetaminophen (TYLENOL) 500 MG tablet Take 1,000 mg by mouth every 6 (six) hours as needed for mild pain or moderate pain. (Patient not taking: Reported on 05/05/2022) 05/05/2022: prn   chlorproMAZINE (THORAZINE) 25 MG tablet Take 25 mg by mouth daily as needed (migraine headaches). (Patient not taking: Reported on 05/05/2022) 05/05/2022: prn   famotidine (PEPCID) 10 MG tablet Take 1-2 tablets (10-20 mg total) by mouth daily. May increase to twice daily as needed (Patient not taking:  Reported on 05/05/2022) 05/05/2022: prn   ibuprofen (ADVIL) 200 MG tablet Take 400 mg by mouth every 6 (six) hours as needed. (Patient not taking: Reported on 06/01/2022) 06/01/2022: prn   Loperamide HCl (IMODIUM A-D PO) Take 1 tablet by mouth as needed. (Patient not taking: Reported on 05/05/2022) 05/05/2022: prn   naphazoline-glycerin (CLEAR EYES REDNESS) 0.012-0.25 % SOLN 1-2 drops as needed for eye irritation. (Patient not taking: Reported on 05/05/2022) 05/05/2022: prn   nitrofurantoin, macrocrystal-monohydrate, (MACROBID) 100 MG capsule Take 1 capsule (100 mg total) by mouth 2 (two) times daily. (Patient not taking: Reported on 06/01/2022)    Polyethyl Glycol-Propyl Glycol (SYSTANE OP) Apply 1 drop to eye daily as needed (dry/irritated eyes). (Patient not taking: Reported on 05/05/2022) 05/05/2022: prn   rizatriptan (MAXALT) 10 MG tablet Take 10 mg by mouth as needed for migraine. (Patient not taking: Reported on 05/05/2022) 05/05/2022: prn   [DISCONTINUED] benzonatate (TESSALON) 200 MG capsule Take 1 capsule (200 mg total) by mouth 3 (three) times daily as needed for cough. 05/05/2022: Took one this am   [DISCONTINUED] HYDROcodone bit-homatropine (HYCODAN) 5-1.5 MG/5ML syrup Take 5 mLs by mouth at bedtime as needed for cough. (Patient not taking: Reported on 05/05/2022)    No facility-administered encounter medications on file as of 06/01/2022.   Allergies  Allergen Reactions   Penicillins Rash    Has patient had a PCN reaction causing immediate rash, facial/tongue/throat swelling, SOB or lightheadedness with hypotension: Unknown  Has patient had a PCN reaction causing severe rash involving mucus membranes or skin necrosis: Yes  Has patient had a PCN reaction that required hospitalization: No  Has patient had a PCN reaction occurring within the last 10 years: No  If all of the above answers are "NO", then may proceed with Cephalosporin use.  Has patient had a PCN reaction causing  immediate rash, facial/tongue/throat swelling, SOB or lightheadedness with hypotension: Unknown Has patient had a PCN reaction causing severe rash involving mucus membranes or skin necrosis: Yes Has patient had a PCN reaction that required hospitalization: No Has patient had a PCN reaction occurring within the last 10 years: No If all of the above answers are "NO", then may proceed with Cephalosporin use.    rash   Sulfa Antibiotics Rash   Penicillamine Other (See Comments)   ROS: No f/c, no n/v.  Baseline bowels (frequent/loose).  No URI symptoms, chest pain, shortness of breath. No bleeding, bruising, rashes. No vaginal discharge, vaginal bleeding. No hematuria.  See HPI for urinary complaints, which are better today.   PHYSICAL EXAM:  BP 130/82   Pulse 84   Temp 98.6 F (37 C) (Tympanic)   Ht '5\' 5"'$  (1.651 m)   Wt 263 lb 3.2 oz (119.4 kg)   BMI 43.80 kg/m   Wt Readings from Last 3 Encounters:  06/01/22 263 lb 3.2 oz (119.4 kg)  05/05/22 259 lb 3.2 oz (117.6 kg)  04/22/22 259 lb 12.8 oz (117.8 kg)   Well-appearing, pleasant female in  no distress HEENT: conjunctiva and sclera are clear, EOMI Neck: no lymphadenopathy Heart: regular rate and rhythm Lungs: clear bilaterally abdomen: no suprapubic tenderness, obese, nontender Back: No CVA tenderness GU: normal external genitalia, on rashes.  Small amount of white noted in crevices (likely the external cream she had been using). No vaginal discharge.  Declined chaperone for external pelvic exam Pelvic exam not performed  Urine dip: small bili, small protein, trace leuks, positive nitrite, neg blood  ASSESSMENT/PLAN:  Urinary frequency - undergoing treatment for yeast vaginitis with improvement; s/p AZO x 1d, and symptoms better today. Urine culture sent, willl base treatment on results - Plan: POCT Urinalysis DIP (Proadvantage Device), Urine Culture  We are sending a urine off for culture to see if you have an infection or not  (the last 2 urinary tract infections were treated presumptively based on symptoms, not proven with any culture). If the culture is negative and you have recurrent symptoms (urgency, frequency and pain with urination), then we will refer you to a urologist for further evaluation.  We discussed potential other diagnoses, a common one being interstitial cystitis, possible pelvic floor issues

## 2022-06-01 NOTE — Patient Instructions (Signed)
  We are sending a urine off for culture to see if you have an infection or not (the last 2 urinary tract infections were treated presumptively based on symptoms, not proven with any culture). If the culture is negative and you have recurrent symptoms (urgency, frequency and pain with urination), then we will refer you to a urologist for further evaluation.  We discussed potential other diagnoses, a common one being interstitial cystitis, possible pelvic floor issues..  You may use the azo if needed for recurrent symptoms, but don't use for longer than 2 days.  Contact us if you develop fever, flank pain, worsening abdominal pain, blood in the urine, or other new/worsening symptoms.

## 2022-06-02 LAB — URINE CULTURE

## 2022-06-07 DIAGNOSIS — G43719 Chronic migraine without aura, intractable, without status migrainosus: Secondary | ICD-10-CM | POA: Diagnosis not present

## 2022-06-08 DIAGNOSIS — F423 Hoarding disorder: Secondary | ICD-10-CM | POA: Diagnosis not present

## 2022-06-08 DIAGNOSIS — F4312 Post-traumatic stress disorder, chronic: Secondary | ICD-10-CM | POA: Diagnosis not present

## 2022-06-08 DIAGNOSIS — F411 Generalized anxiety disorder: Secondary | ICD-10-CM | POA: Diagnosis not present

## 2022-06-08 DIAGNOSIS — F3162 Bipolar disorder, current episode mixed, moderate: Secondary | ICD-10-CM | POA: Diagnosis not present

## 2022-06-09 DIAGNOSIS — G43719 Chronic migraine without aura, intractable, without status migrainosus: Secondary | ICD-10-CM | POA: Diagnosis not present

## 2022-06-09 DIAGNOSIS — M542 Cervicalgia: Secondary | ICD-10-CM | POA: Diagnosis not present

## 2022-07-04 DIAGNOSIS — F411 Generalized anxiety disorder: Secondary | ICD-10-CM | POA: Diagnosis not present

## 2022-07-04 DIAGNOSIS — F3162 Bipolar disorder, current episode mixed, moderate: Secondary | ICD-10-CM | POA: Diagnosis not present

## 2022-07-04 DIAGNOSIS — F900 Attention-deficit hyperactivity disorder, predominantly inattentive type: Secondary | ICD-10-CM | POA: Diagnosis not present

## 2022-07-04 DIAGNOSIS — F4312 Post-traumatic stress disorder, chronic: Secondary | ICD-10-CM | POA: Diagnosis not present

## 2022-07-25 DIAGNOSIS — G4710 Hypersomnia, unspecified: Secondary | ICD-10-CM | POA: Diagnosis not present

## 2022-07-25 DIAGNOSIS — G4733 Obstructive sleep apnea (adult) (pediatric): Secondary | ICD-10-CM | POA: Diagnosis not present

## 2022-08-02 DIAGNOSIS — F411 Generalized anxiety disorder: Secondary | ICD-10-CM | POA: Diagnosis not present

## 2022-08-02 DIAGNOSIS — F3162 Bipolar disorder, current episode mixed, moderate: Secondary | ICD-10-CM | POA: Diagnosis not present

## 2022-08-02 DIAGNOSIS — F5081 Binge eating disorder: Secondary | ICD-10-CM | POA: Diagnosis not present

## 2022-08-02 DIAGNOSIS — F4312 Post-traumatic stress disorder, chronic: Secondary | ICD-10-CM | POA: Diagnosis not present

## 2022-08-24 ENCOUNTER — Other Ambulatory Visit: Payer: Self-pay | Admitting: Medical

## 2022-08-29 DIAGNOSIS — G43719 Chronic migraine without aura, intractable, without status migrainosus: Secondary | ICD-10-CM | POA: Diagnosis not present

## 2022-09-08 DIAGNOSIS — G43719 Chronic migraine without aura, intractable, without status migrainosus: Secondary | ICD-10-CM | POA: Diagnosis not present

## 2022-09-08 DIAGNOSIS — M542 Cervicalgia: Secondary | ICD-10-CM | POA: Diagnosis not present

## 2022-10-25 DIAGNOSIS — G4733 Obstructive sleep apnea (adult) (pediatric): Secondary | ICD-10-CM | POA: Diagnosis not present

## 2022-10-25 DIAGNOSIS — G4710 Hypersomnia, unspecified: Secondary | ICD-10-CM | POA: Diagnosis not present

## 2022-11-29 DIAGNOSIS — G43719 Chronic migraine without aura, intractable, without status migrainosus: Secondary | ICD-10-CM | POA: Diagnosis not present

## 2022-12-28 DIAGNOSIS — M542 Cervicalgia: Secondary | ICD-10-CM | POA: Diagnosis not present

## 2022-12-28 DIAGNOSIS — G43719 Chronic migraine without aura, intractable, without status migrainosus: Secondary | ICD-10-CM | POA: Diagnosis not present

## 2023-01-04 ENCOUNTER — Telehealth: Payer: Self-pay | Admitting: Nurse Practitioner

## 2023-01-04 NOTE — Telephone Encounter (Signed)
Per SB pt needs in person  appointment to discuss/Follow up on CPAP

## 2023-01-12 DIAGNOSIS — Z1231 Encounter for screening mammogram for malignant neoplasm of breast: Secondary | ICD-10-CM | POA: Diagnosis not present

## 2023-01-13 LAB — HM MAMMOGRAPHY

## 2023-02-06 ENCOUNTER — Encounter: Payer: Self-pay | Admitting: Nurse Practitioner

## 2023-02-06 ENCOUNTER — Ambulatory Visit: Payer: BC Managed Care – PPO | Admitting: Nurse Practitioner

## 2023-02-06 ENCOUNTER — Other Ambulatory Visit: Payer: Self-pay | Admitting: Medical

## 2023-02-06 VITALS — BP 134/82 | HR 80 | Wt 274.6 lb

## 2023-02-06 DIAGNOSIS — G4733 Obstructive sleep apnea (adult) (pediatric): Secondary | ICD-10-CM | POA: Diagnosis not present

## 2023-02-06 DIAGNOSIS — R7303 Prediabetes: Secondary | ICD-10-CM

## 2023-02-06 DIAGNOSIS — E8881 Metabolic syndrome: Secondary | ICD-10-CM

## 2023-02-06 DIAGNOSIS — E782 Mixed hyperlipidemia: Secondary | ICD-10-CM | POA: Diagnosis not present

## 2023-02-06 DIAGNOSIS — I10 Essential (primary) hypertension: Secondary | ICD-10-CM

## 2023-02-06 DIAGNOSIS — E559 Vitamin D deficiency, unspecified: Secondary | ICD-10-CM

## 2023-02-06 DIAGNOSIS — Z6841 Body Mass Index (BMI) 40.0 and over, adult: Secondary | ICD-10-CM | POA: Diagnosis not present

## 2023-02-06 DIAGNOSIS — F50819 Binge eating disorder, unspecified: Secondary | ICD-10-CM

## 2023-02-06 MED ORDER — TIRZEPATIDE-WEIGHT MANAGEMENT 2.5 MG/0.5ML ~~LOC~~ SOLN: 2.5000 mg | SUBCUTANEOUS | 0 refills | Status: DC

## 2023-02-06 NOTE — Assessment & Plan Note (Signed)
Chronic. Labs pending today. Unable to tolerate metformin. Weight management unsuccessful with diet and bariatric clinic in the past.  - Trial of GLP-1

## 2023-02-06 NOTE — Assessment & Plan Note (Signed)
Patient reports significant weight gain over the past few years due to multiple stressors and medication side effects. Expressed desire for weight loss and discussed potential benefits of GLP-1 medications. She has tried and failed metformin in the past. She has also tried Ozempic and is open to Wegovy/Ozempic if Zepbound is not covered.  -Attempt to obtain insurance coverage for GLP-1 medications (Zepbound or [employer]) for weight management. -Encourage portion control, increased protein and fiber intake, and regular physical activity. -Order labs to assess current metabolic status. - Instructions for diet/exercise on AVS.

## 2023-02-06 NOTE — Assessment & Plan Note (Signed)
Chronic. Blood pressure is slightly elevated in the office today. Recommend continued work on diet and exercise. Attempting to gain approval for GLP-1 today.  - Continue amlodipine - Monitor BP at home and let me know if your readings are consistently higher than 130/80.

## 2023-02-06 NOTE — Assessment & Plan Note (Signed)
Labs pending.  

## 2023-02-06 NOTE — Assessment & Plan Note (Signed)
Labs pending.

## 2023-02-06 NOTE — Assessment & Plan Note (Signed)
Using CPAP with full face mask for approximately 8 years. Reports machine is becoming loud and disruptive to sleep. Despite this, the machine has been beneficial for her and she is unable to sleep without it. Given the age and condition of the machine I recommend new machine. She is using at least 6 hours nightly and is benefiting from this. Will send order today to [facility].  -Continue current CPAP use. -Order for machine replacement due to age and noise.

## 2023-02-06 NOTE — Progress Notes (Signed)
Alison Clamp, DNP, AGNP-c [facility]  [address] [place], [place] [address] [phone]  ESTABLISHED PATIENT- Chronic Health and/or Follow-Up Visit  Blood pressure 134/82, pulse 80, weight 274 lb 9.6 oz (124.6 kg).    [name] is a 52 y.o. year old female presenting today for evaluation and management of chronic conditions.  The patient, with a history of multiple surgeries and chronic conditions, presents with concerns about weight gain and management of her CPAP machine.  [name] has a diagnosis of OSA for more than 8 years. she has been using her CPAP for management since diagnosis and has had the same machine. She is using it nightly for about five to seven hours. She reports that the machine has become increasingly loud and disruptive to her sleep, however, she is unable to sleep without the machine. Her last sleep study was 8 years ago.   The patient also reports significant weight gain over the past two and a half years, attributing it to a combination of medication side effects, emotional eating in response to stress and trauma, and a sedentary lifestyle. She expresses a strong desire to lose weight and improve her health but feels overwhelmed by the task. She has previously tried a weight loss clinic and the medication Wegovy, but discontinued due to physical health problems and difficulty obtaining the medication. She has also used metformin in the past, but this caused significant exacerbation of her UC. She did not have success of Qsymia.   In addition to her weight concerns, the patient has a complex surgical history, including knee replacements, cervical cancer treatment, hernia repairs, and fistula repair related to ulcerative colitis. She also reports the emotional trauma of losing her ex-partner to COVID-19 in 2021 and the stress of being a single parent.  The patient denies any current symptoms of headaches, chest pain, or dizziness, but does  report occasional shortness of breath, particularly when climbing stairs and attempting to speak simultaneously. She attributes this to her weight rather than any cardiovascular issues. She expresses concern about the potential for developing diabetes and cardiovascular disease in the future due to her weight.  All ROS negative with exception of what is listed above.   PHYSICAL EXAM Physical Exam Vitals and nursing note reviewed.  Constitutional:      General: She is not in acute distress.    Appearance: Normal appearance. She is obese. She is not ill-appearing.  HENT:     Head: Normocephalic.  Eyes:     Pupils: Pupils are equal, round, and reactive to light.  Neck:     Vascular: No carotid bruit.  Cardiovascular:     Rate and Rhythm: Normal rate and regular rhythm.     Pulses: Normal pulses.     Heart sounds: Normal heart sounds.  Pulmonary:     Effort: Pulmonary effort is normal.     Breath sounds: Normal breath sounds.  Abdominal:     General: Bowel sounds are normal. There is no distension.     Palpations: Abdomen is soft.     Tenderness: There is no abdominal tenderness. There is no guarding.  Musculoskeletal:        General: Normal range of motion.     Cervical back: Normal range of motion.     Right lower leg: No edema.     Left lower leg: No edema.  Skin:    General: Skin is warm and dry.  Neurological:     General: No focal deficit present.  Mental Status: She is alert and oriented to person, place, and time.     Sensory: No sensory deficit.     Motor: No weakness.  Psychiatric:        Mood and Affect: Mood normal.        Behavior: Behavior normal.     PLAN Problem List Items Addressed This Visit     OSA (obstructive sleep apnea) - Primary    Using CPAP with full face mask for approximately 8 years. Reports machine is becoming loud and disruptive to sleep. Despite this, the machine has been beneficial for her and she is unable to sleep without it. Given  the age and condition of the machine I recommend new machine. She is using at least 6 hours nightly and is benefiting from this. Will send order today to [facility].  -Continue current CPAP use. -Order for machine replacement due to age and noise.      Relevant Orders   Hemoglobin A1c   CBC with Differential/Platelet   Comprehensive metabolic panel   LP+LDL Direct   VITAMIN D 25 Hydroxy (Vit-D Deficiency, Fractures)   BMI 40.0-44.9, adult (HCC)    Patient reports significant weight gain over the past few years due to multiple stressors and medication side effects. Expressed desire for weight loss and discussed potential benefits of GLP-1 medications. She has tried and failed metformin in the past. She has also tried Ozempic and is open to Wegovy/Ozempic if Zepbound is not covered.  -Attempt to obtain insurance coverage for GLP-1 medications (Zepbound or [employer]) for weight management. -Encourage portion control, increased protein and fiber intake, and regular physical activity. -Order labs to assess current metabolic status. - Instructions for diet/exercise on AVS.        Relevant Medications   tirzepatide (ZEPBOUND) 2.5 MG/0.5ML injection vial   Other Relevant Orders   Hemoglobin A1c   CBC with Differential/Platelet   Comprehensive metabolic panel   LP+LDL Direct   VITAMIN D 25 Hydroxy (Vit-D Deficiency, Fractures)   Vitamin D insufficiency    Labs pending.       Relevant Orders   Hemoglobin A1c   CBC with Differential/Platelet   Comprehensive metabolic panel   LP+LDL Direct   VITAMIN D 25 Hydroxy (Vit-D Deficiency, Fractures)   Essential hypertension    Chronic. Blood pressure is slightly elevated in the office today. Recommend continued work on diet and exercise. Attempting to gain approval for GLP-1 today.  - Continue amlodipine - Monitor BP at home and let me know if your readings are consistently higher than 130/80.      Relevant Medications   tirzepatide (ZEPBOUND) 2.5  MG/0.5ML injection vial   Other Relevant Orders   Hemoglobin A1c   CBC with Differential/Platelet   Comprehensive metabolic panel   LP+LDL Direct   VITAMIN D 25 Hydroxy (Vit-D Deficiency, Fractures)   Pre-diabetes    Chronic. Labs pending today. Unable to tolerate metformin. Weight management unsuccessful with diet and bariatric clinic in the past.  - Trial of GLP-1      Relevant Medications   tirzepatide (ZEPBOUND) 2.5 MG/0.5ML injection vial   Other Relevant Orders   Hemoglobin A1c   CBC with Differential/Platelet   Comprehensive metabolic panel   LP+LDL Direct   VITAMIN D 25 Hydroxy (Vit-D Deficiency, Fractures)   Hyperlipidemia, unspecified    Labs pending      Relevant Medications   tirzepatide (ZEPBOUND) 2.5 MG/0.5ML injection vial   Other Relevant Orders   Hemoglobin A1c  CBC with Differential/Platelet   Comprehensive metabolic panel   LP+LDL Direct   VITAMIN D 25 Hydroxy (Vit-D Deficiency, Fractures)   Metabolic syndrome   Relevant Medications   tirzepatide (ZEPBOUND) 2.5 MG/0.5ML injection vial   Other Relevant Orders   Hemoglobin A1c   CBC with Differential/Platelet   Comprehensive metabolic panel   LP+LDL Direct   VITAMIN D 25 Hydroxy (Vit-D Deficiency, Fractures)    Return in about 3 months (around 05/08/2023) for Med Management 30.   Alison Clamp, DNP, AGNP-c

## 2023-02-06 NOTE — Patient Instructions (Signed)
We will see what medications insurance will cover to help on your journey. In the meantime, I want you to work on the tasks below.   One step at a time.    WEIGHT LOSS PLANNING Your progress today shows:     02/06/2023    1:29 PM 06/01/2022   10:27 AM 05/05/2022    3:15 PM  Vitals with BMI  Height  5\' 5"  5\' 5"   Weight 274 lbs 10 oz 263 lbs 3 oz 259 lbs 3 oz  BMI  43.8 43.13  Systolic 134 130 --  Diastolic 82 82 --  Pulse 80 84     For best management of weight, it is vital to balance intake versus output. This means the number of calories burned per day must be less than the calories you take in with food and drink.   I recommend trying to follow a diet with the following: Calories: 1200-1500 calories per day Carbohydrates: 150-180 grams of carbohydrates per day  Why: Gives your body enough "quick fuel" for cells to maintain normal function without sending them into starvation mode.  Protein: At least 90 grams of protein per day- 30 grams with each meal Why: Protein takes longer and uses more energy than carbohydrates to break down for fuel. The carbohydrates in your meals serves as quick energy sources and proteins help use some of that extra quick energy to break down to produce long term energy. This helps you not feel hungry as quickly and protein breakdown burns calories.  Water: Drink AT LEAST 64 ounces of water per day  Why: Water is essential to healthy metabolism. Water helps to fill the stomach and keep you fuller longer. Water is required for healthy digestion and filtering of waste in the body.  Fat: Limit fats in your diet- when choosing fats, choose foods with lower fats content such as lean meats (chicken, fish, [place]).  Why: Increased fat intake leads to storage "for later". Once you burn your carbohydrate energy, your body goes into fat and protein breakdown mode to help you loose weight.  Cholesterol: Fats and oils that are LIQUID at room temperature are best. Choose  vegetable oils (olive oil, avocado oil, nuts). Avoid fats that are SOLID at room temperature (animal fats, processed meats). Healthy fats are often found in whole grains, beans, nuts, seeds, and berries.  Why: Elevated cholesterol levels lead to build up of cholesterol on the inside of your blood vessels. This will eventually cause the blood vessels to become hard and can lead to high blood pressure and damage to your organs. When the blood flow is reduced, but the pressure is high from cholesterol buildup, parts of the cholesterol can break off and form clots that can go to the brain or heart leading to a stroke or heart attack.  Fiber: Increase amount of SOLUBLE the fiber in your diet. This helps to fill you up, lowers cholesterol, and helps with digestion. Some foods high in soluble fiber are oats, peas, beans, apples, carrots, barley, and citrus fruits.   Why: Fiber fills you up, helps remove excess cholesterol, and aids in healthy digestion which are all very important in weight management.   I recommend the following as a minimum activity routine: Purposeful walk or other physical activity at least 20 minutes every single day. This means purposefully taking a walk, jog, bike, swim, treadmill, elliptical, dance, etc.  This activity should be ABOVE your normal daily activities, such as walking at work. Goal exercise  should be at least 150 minutes a week- work your way up to this.   Heart Rate: Your maximum exercise heart rate should be 220 - Your Age in Years. When exercising, get your heart rate up, but avoid going over the maximum targeted heart rate.  60-70% of your maximum heart rate is where you tend to burn the most fat. To find this number:  220 - Age In Years= Max HR  Max HR x 0.6 (or 0.7) = Fat Burning HR The Fat Burning HR is your goal heart rate while working out to burn the most fat.  NEVER exercise to the point your feel lightheaded, weak, nauseated, dizzy. If you experience ANY of  these symptoms- STOP exercise! Allow yourself to cool down and your heart rate to come down. Then restart slower next time.  If at ANY TIME you feel chest pain or chest pressure during exercise, STOP IMMEDIATELY and seek medical attention.

## 2023-02-07 LAB — CBC WITH DIFFERENTIAL/PLATELET
Basophils Absolute: 0.1 10*3/uL (ref 0.0–0.2)
Basos: 1 %
EOS (ABSOLUTE): 0.3 10*3/uL (ref 0.0–0.4)
Eos: 3 %
Hematocrit: 45.7 % (ref 34.0–46.6)
Hemoglobin: 15.1 g/dL (ref 11.1–15.9)
Immature Grans (Abs): 0 10*3/uL (ref 0.0–0.1)
Immature Granulocytes: 0 %
Lymphocytes Absolute: 2.3 10*3/uL (ref 0.7–3.1)
Lymphs: 24 %
MCH: 30.2 pg (ref 26.6–33.0)
MCHC: 33 g/dL (ref 31.5–35.7)
MCV: 91 fL (ref 79–97)
Monocytes Absolute: 0.9 10*3/uL (ref 0.1–0.9)
Monocytes: 10 %
Neutrophils Absolute: 5.9 10*3/uL (ref 1.4–7.0)
Neutrophils: 62 %
Platelets: 309 10*3/uL (ref 150–450)
RBC: 5 x10E6/uL (ref 3.77–5.28)
RDW: 13.1 % (ref 11.7–15.4)
WBC: 9.6 10*3/uL (ref 3.4–10.8)

## 2023-02-07 LAB — COMPREHENSIVE METABOLIC PANEL
ALT: 35 IU/L — ABNORMAL HIGH (ref 0–32)
AST: 23 IU/L (ref 0–40)
Albumin: 4.4 g/dL (ref 3.8–4.9)
Alkaline Phosphatase: 152 IU/L — ABNORMAL HIGH (ref 44–121)
BUN/Creatinine Ratio: 15 (ref 9–23)
BUN: 10 mg/dL (ref 6–24)
Bilirubin Total: 1.2 mg/dL (ref 0.0–1.2)
CO2: 26 mmol/L (ref 20–29)
Calcium: 9.8 mg/dL (ref 8.7–10.2)
Chloride: 104 mmol/L (ref 96–106)
Creatinine, Ser: 0.67 mg/dL (ref 0.57–1.00)
Globulin, Total: 3 g/dL (ref 1.5–4.5)
Glucose: 119 mg/dL — ABNORMAL HIGH (ref 70–99)
Potassium: 4.4 mmol/L (ref 3.5–5.2)
Sodium: 145 mmol/L — ABNORMAL HIGH (ref 134–144)
Total Protein: 7.4 g/dL (ref 6.0–8.5)
eGFR: 104 mL/min/{1.73_m2} (ref >59)

## 2023-02-07 LAB — LP+LDL DIRECT
Cholesterol, Total: 186 mg/dL (ref 100–199)
HDL: 36 mg/dL — ABNORMAL LOW (ref >39)
LDL Chol Calc (NIH): 84 mg/dL (ref 0–99)
LDL Direct: 98 mg/dL (ref 0–99)
Triglycerides: 406 mg/dL — ABNORMAL HIGH (ref 0–149)
VLDL Cholesterol Cal: 66 mg/dL — ABNORMAL HIGH (ref 5–40)

## 2023-02-07 LAB — HEMOGLOBIN A1C
Est. average glucose Bld gHb Est-mCnc: 126 mg/dL
Hgb A1c MFr Bld: 6 % — ABNORMAL HIGH (ref 4.8–5.6)

## 2023-02-07 LAB — VITAMIN D 25 HYDROXY (VIT D DEFICIENCY, FRACTURES): Vit D, 25-Hydroxy: 21.5 ng/mL — ABNORMAL LOW (ref 30.0–100.0)

## 2023-02-08 ENCOUNTER — Telehealth: Payer: Self-pay

## 2023-02-08 NOTE — Telephone Encounter (Signed)
Key: [id number] Rx #: [id number] Status: sent iconSent to Plan today Drug: Zepbound 2.5MG /0.5ML pen-injectors Form: [employer] [place] [employer] Form

## 2023-02-09 NOTE — Telephone Encounter (Signed)
(  Key: [id number]) PA Case ID #: [id number] Rx #: [id number] Outcome: Approved on September 25 by [facility] [employer] [facility] 2017  Authorization Expiration Date: 06/14/2023 Drug: Zepbound 2.5MG /0.5ML pen-injectors Form: [employer] [place] [employer] Electronic Request Form

## 2023-02-10 NOTE — Telephone Encounter (Signed)
PA approved.

## 2023-02-19 ENCOUNTER — Other Ambulatory Visit: Payer: Self-pay | Admitting: Family Medicine

## 2023-02-23 ENCOUNTER — Other Ambulatory Visit: Payer: Self-pay | Admitting: Nurse Practitioner

## 2023-02-23 DIAGNOSIS — E559 Vitamin D deficiency, unspecified: Secondary | ICD-10-CM

## 2023-02-23 MED ORDER — VITAMIN D (ERGOCALCIFEROL) 1.25 MG (50000 UNIT) PO CAPS: 50000.0000 [IU] | ORAL_CAPSULE | ORAL | 3 refills | Status: DC

## 2023-03-02 DIAGNOSIS — Z0289 Encounter for other administrative examinations: Secondary | ICD-10-CM

## 2023-03-07 ENCOUNTER — Encounter (INDEPENDENT_AMBULATORY_CARE_PROVIDER_SITE_OTHER): Payer: BC Managed Care – PPO | Admitting: Physician Assistant

## 2023-03-08 ENCOUNTER — Ambulatory Visit: Payer: BC Managed Care – PPO | Admitting: Bariatrics

## 2023-03-20 ENCOUNTER — Ambulatory Visit: Payer: BC Managed Care – PPO | Admitting: Medical

## 2023-03-20 ENCOUNTER — Other Ambulatory Visit: Payer: Self-pay

## 2023-03-20 ENCOUNTER — Telehealth: Payer: Self-pay | Admitting: Internal Medicine

## 2023-03-20 VITALS — BP 110/68 | HR 89 | Wt 272.6 lb

## 2023-03-20 DIAGNOSIS — M1990 Unspecified osteoarthritis, unspecified site: Secondary | ICD-10-CM | POA: Diagnosis not present

## 2023-03-20 DIAGNOSIS — S60031A Contusion of right middle finger without damage to nail, initial encounter: Secondary | ICD-10-CM

## 2023-03-20 DIAGNOSIS — R229 Localized swelling, mass and lump, unspecified: Secondary | ICD-10-CM | POA: Diagnosis not present

## 2023-03-20 DIAGNOSIS — G4733 Obstructive sleep apnea (adult) (pediatric): Secondary | ICD-10-CM
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
## 2023-03-20 NOTE — Telephone Encounter (Signed)
Pt is here for an appt and wants order sent to [facility] machine and supplies. She does not want to go through [facility] as its been taking too long for someone to reach out. She states it has been 10 months and someone just called her today. Please send her info to [facility]

## 2023-03-23 ENCOUNTER — Ambulatory Visit: Payer: BC Managed Care – PPO | Admitting: Family Medicine

## 2023-03-27 DIAGNOSIS — G4733 Obstructive sleep apnea (adult) (pediatric): Secondary | ICD-10-CM | POA: Diagnosis not present

## 2023-03-27 DIAGNOSIS — G43719 Chronic migraine without aura, intractable, without status migrainosus: Secondary | ICD-10-CM | POA: Diagnosis not present

## 2023-03-27 DIAGNOSIS — H5213 Myopia, bilateral: Secondary | ICD-10-CM | POA: Diagnosis not present

## 2023-04-12 ENCOUNTER — Ambulatory Visit (INDEPENDENT_AMBULATORY_CARE_PROVIDER_SITE_OTHER): Payer: BC Managed Care – PPO | Admitting: Family Medicine

## 2023-04-12 ENCOUNTER — Ambulatory Visit (HOSPITAL_COMMUNITY)
Admission: RE | Admit: 2023-04-12 | Discharge: 2023-04-12 | Disposition: A | Discharge status: Home / Self Care | Payer: BC Managed Care – PPO | Source: Ambulatory Visit | Attending: Family Medicine | Admitting: Family Medicine

## 2023-04-12 ENCOUNTER — Other Ambulatory Visit: Payer: Self-pay

## 2023-04-12 ENCOUNTER — Encounter (INDEPENDENT_AMBULATORY_CARE_PROVIDER_SITE_OTHER): Payer: Self-pay | Admitting: Family Medicine

## 2023-04-12 VITALS — BP 139/90 | HR 73 | Temp 98.7°F | Ht 65.0 in | Wt 271.0 lb

## 2023-04-12 DIAGNOSIS — R0602 Shortness of breath: Secondary | ICD-10-CM | POA: Diagnosis not present

## 2023-04-12 DIAGNOSIS — E785 Hyperlipidemia, unspecified: Secondary | ICD-10-CM

## 2023-04-12 DIAGNOSIS — Z1331 Encounter for screening for depression: Secondary | ICD-10-CM

## 2023-04-12 DIAGNOSIS — E569 Vitamin deficiency, unspecified: Secondary | ICD-10-CM

## 2023-04-12 DIAGNOSIS — Z6841 Body Mass Index (BMI) 40.0 and over, adult: Secondary | ICD-10-CM

## 2023-04-12 DIAGNOSIS — R7303 Prediabetes: Secondary | ICD-10-CM | POA: Diagnosis not present

## 2023-04-12 DIAGNOSIS — F5089 Other specified eating disorder: Secondary | ICD-10-CM

## 2023-04-12 DIAGNOSIS — G4733 Obstructive sleep apnea (adult) (pediatric): Secondary | ICD-10-CM

## 2023-04-12 DIAGNOSIS — I159 Secondary hypertension, unspecified: Secondary | ICD-10-CM

## 2023-04-12 DIAGNOSIS — R5383 Other fatigue: Secondary | ICD-10-CM

## 2023-04-12 DIAGNOSIS — F39 Unspecified mood [affective] disorder: Secondary | ICD-10-CM

## 2023-04-12 DIAGNOSIS — K51819 Other ulcerative colitis with unspecified complications: Secondary | ICD-10-CM

## 2023-04-12 NOTE — Progress Notes (Signed)
[name], D.O.  ABFM, ABOM Specializing in Clinical Bariatric Medicine Office located at: [address]  [place], [place]  [address]     Bariatric Medicine Visit  Dear [name], Sung Amabile, NP   Thank you for referring [name] to our clinic today for evaluation.  We performed a consultation to discuss her options for treatment and educate the patient on her disease state.  The following note includes my evaluation and treatment recommendations.   Please do not hesitate to reach out to me directly if you have any further concerns.    Assessment and Plan:   FOR THE DISEASE OF OBESITY: BMI 40.0-44.9, adult (HCC) Muscle mass is 127.8lbs Fat mass is 137.2lbs. Total body water is 96.8.  Counseling done on how various foods will affect these numbers and how to maximize success  Total lbs lost to date: N/A Total weight loss percentage to date: N/A   Recommended Dietary Goals [name] is currently in the action stage of change. As such, her goal is to continue weight management plan. She has agreed to: follow the Category 2 plan - 1200 kcal per day with only 200 snack calories   Behavioral Intervention We discussed the following Behavioral Modification Strategies today: increasing lean protein intake to established goals, increasing vegetables, avoiding skipping meals, work on meal planning and preparation, keeping healthy foods at home, avoiding temptations and identifying enticing environmental cues, continue to work on implementation of reduced calorie nutritional plan, continue to practice mindfulness when eating, planning for success, and better snacking choices  Additional resources provided today: Handout on traveling and holiday eating strategies and thanksgiving eating strategies  Evidence-based interventions for health behavior change were utilized today including the discussion of self monitoring techniques, problem-solving barriers and SMART goal setting  techniques.   Regarding patient's less desirable eating habits and patterns, we employed the technique of small changes.   Pt will specifically work on: beginning the prescribed meal plan for next visit.   Recommended Physical Activity Goals [name] has been advised to work up to 150 minutes of moderate intensity aerobic activity a week and strengthening exercises 2-3 times per week for cardiovascular health, weight loss maintenance and preservation of muscle mass.   She has agreed to :  Think about enjoyable ways to increase daily physical activity and overcoming barriers to exercise and Increase physical activity in their day and reduce sedentary time (increase NEAT).   Pharmacotherapy We discussed various medication options to help [name] with her weight loss efforts and we both agreed to : continue with nutritional and behavioral strategies   FOR ASSOCIATED CONDITIONS ADDRESSED TODAY: Fatigue Assessment:  [name] does feel that her weight is causing her energy to be lower than it should be. Fatigue may be related to obesity, depression or many other causes. she does not appear to have any red flag symptoms and this appears to most likely be related to her current lifestyle habits and dietary intake.  Plan:  Labs will be ordered and reviewed with her at their next office visit in two weeks. Other fatigue -     CBC with Differential/Platelet -     Folate -     T4, free -     TSH -     Vitamin B12 -     EKG 12-Lead  Epworth sleepiness scale score appears to be within normal limits.  Her ESS score is 1.   [name] denies daytime somnolence and admits waking up still tired.  Patient has a history of symptoms of OSA. [name] generally gets 5 or 6 hours of sleep per night, and states that she has generally can't stay asleep. Snoring is not present. Apneic episodes is not present.   ECG: Performed and reviewed/ interpreted independently.  Normal sinus rhythm, rate 70bpm; reassuring without any  acute abnormalities, will continue to monitor for symptoms   Modified PHQ-9 Depression Screen: Her Food and Mood (modified PHQ-9) score was 27.   Shortness of breath on exertion Assessment:  [name] does feel that she gets out of breath more easily than she used to when she exercises and seems to be worsening over time with weight gain.  This has gotten worse recently. [name] denies shortness of breath at rest or orthopnea. [name]'s shortness of breath appears to be obesity related and exercise induced, as they do not appear to have any "red flag" symptoms/ concerns today.  Also, this condition appears to be related to a state of poor cardiovascular conditioning   Plan:  Obtain labs today and will be reviewed with her at their next office visit in two weeks.  Indirect Calorimeter completed today to help guide our dietary regimen. It shows a VO2 of 318 and a REE of 2189.  Her calculated basal metabolic rate is 2956 thus her measured basal metabolic rate is better than expected.  Patient agreed to work on weight loss at this time.  As [name] progresses through our weight loss program, we will gradually increase exercise as tolerated to treat her current condition.   If [name] follows our recommendations and loses 5-10% of their weight without improvement of her shortness of breath or if at any time, symptoms become more concerning, they agree to urgently follow up with their PCP/ specialist for further consideration/ evaluation.   [name] verbalizes agreement with this plan.    Pre-diabetes Assessment & Plan:  Condition is Not optimized.. Pt informed me that she has a hx of pre-diabetes. This is diet/exercise controlled. Pt last A1c was done on 02/06/2023 that revealed an increase from 5.8 to 6.0. [name] will begin to work on weight loss via their meal plan we devised to help decrease the risk of progressing to diabetes. I advised pt to decrease simple carbs/ sugars; increase fiber and proteins ->  follow her meal plan.  Anticipatory guidance given.  We will recheck A1c and fasting insulin level today.  Lab Results  Component Value Date   HGBA1C 6.0 (H) 02/06/2023   HGBA1C 5.8 (H) 10/16/2020   HGBA1C 5.5 09/20/2019   INSULIN 26.1 (H) 12/04/2019  Pre-diabetes -     Hemoglobin A1c -     Insulin, random   Hyperlipidemia, unspecified hyperlipidemia type Assessment & Plan:  Condition is Not at goal.. Pt has a hx of HLD and is being treated with Lipitor 20mg  once daily. She tolerates this well and notes that she should decrease her intake in fatty/fast food. Pt has a low HDL and elevated triglycerides. [name] agrees to continue with meds and begin our treatment plan of a heart-heathy, low cholesterol meal plan. We will continue routine screening as patient continues to achieve health goals along their weight loss journey.  Lab Results  Component Value Date   CHOL 186 02/06/2023   HDL 36 (L) 02/06/2023   LDLCALC 84 02/06/2023   LDLDIRECT 98 02/06/2023   TRIG 406 (H) 02/06/2023   CHOLHDL 3.6 02/28/2022  Hyperlipidemia, unspecified hyperlipidemia type -     Lipid Panel With LDL/HDL Ratio  Vitamin deficiency Assessment:  Condition is Not at goal.. Pt has a worsening vitamin D level. She was placed on Ergocalciferol 50K IU weekly by her PCP on 02/23/2023. I discussed the importance of vitamin D to the patient's health and well-being as well as to their ability to lose weight. We will monitor levels regularly and recheck them today to keep levels within normal limits and prevent over supplementation. Lab Results  Component Value Date   VD25OH 21.5 (L) 02/06/2023   VD25OH 34.2 10/16/2020   VD25OH 28.5 (L) 09/20/2019  Vitamin deficiency -     VITAMIN D 25 Hydroxy (Vit-D Deficiency, Fractures) -     Vitamin B12   Secondary hypertension Assessment & Plan:  Condition is Not optimized. PT BP today is elevated at 139/90. Pt currently treats this with Norvasc 5mg  daily. She  tolerates this well without difficulty. Counseled [name] on pathophysiology of disease and discussed treatment plan, which always includes dietary and lifestyle modification as first line. Reminded patient that if they ever feel poorly in any way, to check their blood pressure and pulse as well. We will continue to monitor closely alongside PCP/ specialists. Last 3 blood pressure readings in our office are as follows: BP Readings from Last 3 Encounters:  04/12/23 (!) 139/90  03/20/23 110/68  02/06/23 134/82  Secondary hypertension -     Comprehensive metabolic panel   OSA (obstructive sleep apnea) Assessment & Plan:  Condition is Controlled.. Pt was diagnosed and is being currently being treated for OSA. She uses her CPAP machine religiously and monitors this dx with her PCP.    Other ulcerative colitis with complication (HCC) Assessment & Plan: Pt has a hx of ulcerative colitis which is currently being treated with Colestid 1g BID and loperamide prn. She tolerates both these medications well and denies any adverse side effects.  Other ulcerative colitis with complication (HCC) -     Comprehensive metabolic panel   Mood disorder (HCC)- emotional eating Assessment: Condition is Not optimized.. Denies any SI/HI. Mood is not stable.She was diagnosed with BPD and feels as this is not true as she was diagnosed during a hard time with surgeries and the death of her daughters father. She feels as she was just having a mental breakdown at that time.  She tends to eat when she is stressed, sad, angry, guilty, bored, as a reward, for comfort, and to stay awake. She tends to sneak food so she can't be seen eating it. She feels guilt and self heat when overeating or making poor food decisions as she feels its a waste of money and her IBS flares when doing so. She has felt judged growing up and feels out of control with eating. She suspects she has a binge eating disorder. Pt feels almost  everyday that her extra weight decreases her interest in things she used to enjoy, decrease energy, has effected her sleep, makes her feel depressed, effects her mood, and increased her fatigue.   Plan:  Patient was referred to Dr. Dewaine Conger, our Bariatric Psychologist, for evaluation due to her elevated PHQ-9 score and significant struggles with emotional eating. She agrees to meet with her and will schedule an appointment with her. Discussed cognitive distortions, coping thoughts, and how to change our thoughts/ self talk regarding foods/ eating patterns. No SI/ HI.  Mood stable currently. Behavior modification techniques were discussed today to help deal with emotional/ non-hunger eating behaviors including but not limited to exercise for stress management, meditation/prayer, behavorial  sessions with her therapist and self care activities like adequate sleep (7-9 hrs/nite).  FOLLOW UP:   Follow up in 2 weeks. She was informed of the importance of frequent follow up visits to maximize her success with intensive lifestyle modifications for her multiple health conditions.  [name] is aware that we will review all of her lab results at our next visit.  She is aware that if anything is critical/ life threatening with the results, we will be contacting her via MyChart prior to the office visit to discuss management.    Chief Complaint:   OBESITY [name] (MR# [id number]) is a 52 y.o. female who presents for evaluation and treatment of obesity and related comorbidities. Current BMI is Body mass index is 45.1 kg/m. [name] has been struggling with her weight for many years and has been unsuccessful in either losing weight, maintaining weight loss, or reaching her healthy weight goal.  [name] is currently in the action stage of change and ready to dedicate time achieving and maintaining a healthier weight. [name] [facility] is interested in becoming our patient  and working on intensive lifestyle modifications including (but not limited to) diet and exercise for weight loss.  [name] works as a Geologist, engineering works 40hrs a wk. Patient is single and has children. She lives with her 61yr old daughter.  Pt is ready to start her weight loss journey so she is able to look younger and not be embarrassed about how she looks. She desires to be 142lbs and has been overweight for the last 46yrs. She feels as she started gaining weight when she was diagnosed with cancer. She was here previously but did not follow through. She feels as the best diet is to meal prep. She eats take out/fast food 5x a wk and does not like to cook due to her kitchen being messy, unorganized and fatigue. She tends to crave sandwiches, pizza, and chips and craves food around late evenings. She dislikes eggplants and spicy foods. She tends to snack on chips, popcorn, and candy if it is around. She skips breakfast almost daily. Pt drinks soda regularly and drinks milk, juice, and sweet tea. She feels as her worst food habits is eating chips and sandwiches. She feels as struggles with poor food choices, excessive hunger, portion control, eating quickly, and eating until she is stuffed.     Subjective:   This is the patient's first visit at [facility].  The patient's NEW PATIENT PACKET that they filled out prior to today's office visit was reviewed at length and information from that paperwork was included within the following office visit note.    Included in the packet: current and past health history, medications, allergies, ROS, gynecologic history (women only), surgical history, family history, social history, weight history, weight loss surgery history (for those that have had weight loss surgery), nutritional evaluation, mood and food questionnaire along with a depression screening (PHQ9) on all patients, an Epworth questionnaire, sleep habits questionnaire,  patient life and health improvement goals questionnaire. These will all be scanned into the patient's chart under the "media" tab.   Review of Systems: Please refer to new patient packet scanned into media. Pertinent positives were addressed with patient today.  Reviewed by clinician on day of visit: allergies, medications, problem list, medical history, surgical history, family history, social history, and previous encounter notes.  During the visit, I independently reviewed the patient's EKG, bioimpedance scale  results, and indirect calorimeter results. I used this information to tailor a meal plan for the patient that will help [name] to lose weight and will improve her obesity-related conditions going forward.  I performed a medically necessary appropriate examination and/or evaluation. I discussed the assessment and treatment plan with the patient. The patient was provided an opportunity to ask questions and all were answered. The patient agreed with the plan and demonstrated an understanding of the instructions. Labs were ordered today (unless patient declined them) and will be reviewed with the patient at our next visit unless more critical results need to be addressed immediately. Clinical information was updated and documented in the EMR.    Objective:   PHYSICAL EXAM: Blood pressure (!) 139/90, pulse 73, temperature 98.7 F (37.1 C), height 5\' 5"  (1.651 m), weight 271 lb (122.9 kg), SpO2 96%. Body mass index is 45.1 kg/m.  General: Well Developed, well nourished, and in no acute distress.  HEENT: Normocephalic, atraumatic; EOMI, sclerae are anicteric. Skin: Warm and dry, good turgor Chest:  Normal excursion, shape, no gross ABN Respiratory: No conversational dyspnea; speaking in full sentences NeuroM-Sk:  Normal gross ROM * 4 extremities  Psych: A and O *3, insight adequate, mood- full   Anthropometric Measurements Height: 5\' 5"  (1.651 m) Weight: 271 lb (122.9  kg) BMI (Calculated): 45.1 Weight at Last Visit: na Weight Lost Since Last Visit: na Weight Gained Since Last Visit: na Starting Weight: 271lb Total Weight Loss (lbs): 0 lb (0 kg) Peak Weight: 284lb Waist Measurement : 52 inches   Body Composition  Body Fat %: 50.5 % Fat Mass (lbs): 137.2 lbs Muscle Mass (lbs): 127.8 lbs Total Body Water (lbs): 96.8 lbs Visceral Fat Rating : 17   Other Clinical Data RMR: [id number] Fasting: yes Labs: yes Today's Visit #: 1 Starting Date: 04/12/23 Comments: first visit    DIAGNOSTIC DATA REVIEWED:  BMET    Component Value Date/Time   NA 145 (H) 02/06/2023 1428   K 4.4 02/06/2023 1428   CL 104 02/06/2023 1428   CO2 26 02/06/2023 1428   GLUCOSE 119 (H) 02/06/2023 1428   GLUCOSE 110 (H) 04/22/2022 0643   BUN 10 02/06/2023 1428   CREATININE 0.67 02/06/2023 1428   CALCIUM 9.8 02/06/2023 1428   GFRNONAA 109 12/04/2019 1327   GFRAA 125 12/04/2019 1327   Lab Results  Component Value Date   HGBA1C 6.0 (H) 02/06/2023   HGBA1C 5.4 04/27/2017   Lab Results  Component Value Date   INSULIN 26.1 (H) 12/04/2019   Lab Results  Component Value Date   TSH 2.92 02/23/2022   CBC    Component Value Date/Time   WBC 9.6 02/06/2023 1428   WBC 8.6 02/23/2022 1116   RBC 5.00 02/06/2023 1428   RBC 4.93 02/23/2022 1116   HGB 15.1 02/06/2023 1428   HCT 45.7 02/06/2023 1428   PLT 309 02/06/2023 1428   MCV 91 02/06/2023 1428   MCH 30.2 02/06/2023 1428   MCH 30.1 05/01/2019 0301   MCHC 33.0 02/06/2023 1428   MCHC 33.7 02/23/2022 1116   RDW 13.1 02/06/2023 1428   Iron Studies    Component Value Date/Time   IRON 70 09/20/2019 0828   TIBC 359 09/20/2019 0828   FERRITIN 82 09/20/2019 0828   IRONPCTSAT 19 09/20/2019 0828   Lipid Panel     Component Value Date/Time   CHOL 186 02/06/2023 1428   TRIG 406 (H) 02/06/2023 1428   HDL 36 (L) 02/06/2023 1428  CHOLHDL 3.6 02/28/2022 1135   LDLCALC 84 02/06/2023 1428   LDLDIRECT 98 02/06/2023  1428   Hepatic Function Panel     Component Value Date/Time   PROT 7.4 02/06/2023 1428   ALBUMIN 4.4 02/06/2023 1428   AST 23 02/06/2023 1428   ALT 35 (H) 02/06/2023 1428   ALKPHOS 152 (H) 02/06/2023 1428   BILITOT 1.2 02/06/2023 1428   BILIDIR 0.1 10/15/2020 1136      Component Value Date/Time   TSH 2.92 02/23/2022 1116   Nutritional Lab Results  Component Value Date   VD25OH 21.5 (L) 02/06/2023   VD25OH 34.2 10/16/2020   VD25OH 28.5 (L) 09/20/2019    Attestation Statements:   I, Clinical biochemist, acting as a Stage manager for Thomasene Lot, DO., have compiled all relevant documentation for today's office visit on behalf of Thomasene Lot, DO, while in the presence of [employer], DO.  Reviewed by clinician on day of visit: allergies, medications, problem list, medical history, surgical history, family history, social history, and previous encounter notes pertinent to patient's obesity diagnosis. I have spent 50 minutes in the care of the patient today including: preparing to see patient (e.g. review and interpretation of tests, old notes ), obtaining and/or reviewing separately obtained history, performing a medically appropriate examination or evaluation, counseling and educating the patient, ordering medications, test or procedures, documenting clinical information in the electronic or other health care record, and independently interpreting results and communicating results to the patient, family, or caregiver   I have reviewed the above documentation for accuracy and completeness, and I agree with the above. [name], D.O.  The 21st Century Cures Act was signed into law in 2016 which includes the topic of electronic health records.  This provides immediate access to information in MyChart.  This includes consultation notes, operative notes, office notes, lab results and pathology reports.  If you have any questions about what you read please let us know at  your next visit so we can discuss your concerns and take corrective action if need be.  We are right here with you.

## 2023-04-14 DIAGNOSIS — R7303 Prediabetes: Secondary | ICD-10-CM | POA: Diagnosis not present

## 2023-04-14 DIAGNOSIS — K51819 Other ulcerative colitis with unspecified complications: Secondary | ICD-10-CM | POA: Diagnosis not present

## 2023-04-14 DIAGNOSIS — I159 Secondary hypertension, unspecified: Secondary | ICD-10-CM | POA: Diagnosis not present

## 2023-04-14 DIAGNOSIS — E785 Hyperlipidemia, unspecified: Secondary | ICD-10-CM | POA: Diagnosis not present

## 2023-04-14 DIAGNOSIS — R5383 Other fatigue: Secondary | ICD-10-CM | POA: Diagnosis not present

## 2023-04-14 DIAGNOSIS — G4733 Obstructive sleep apnea (adult) (pediatric): Secondary | ICD-10-CM | POA: Diagnosis not present

## 2023-04-14 DIAGNOSIS — F39 Unspecified mood [affective] disorder: Secondary | ICD-10-CM | POA: Diagnosis not present

## 2023-04-14 DIAGNOSIS — E569 Vitamin deficiency, unspecified: Secondary | ICD-10-CM | POA: Diagnosis not present

## 2023-04-15 LAB — CBC WITH DIFFERENTIAL/PLATELET
Basophils Absolute: 0.1 10*3/uL (ref 0.0–0.2)
Basos: 1 %
EOS (ABSOLUTE): 0.4 10*3/uL (ref 0.0–0.4)
Eos: 4 %
Hematocrit: 44.7 % (ref 34.0–46.6)
Hemoglobin: 14.9 g/dL (ref 11.1–15.9)
Immature Grans (Abs): 0 10*3/uL (ref 0.0–0.1)
Immature Granulocytes: 0 %
Lymphocytes Absolute: 1.8 10*3/uL (ref 0.7–3.1)
Lymphs: 21 %
MCH: 29.8 pg (ref 26.6–33.0)
MCHC: 33.3 g/dL (ref 31.5–35.7)
MCV: 89 fL (ref 79–97)
Monocytes Absolute: 0.9 10*3/uL (ref 0.1–0.9)
Monocytes: 11 %
Neutrophils Absolute: 5.5 10*3/uL (ref 1.4–7.0)
Neutrophils: 63 %
Platelets: 304 10*3/uL (ref 150–450)
RBC: 5 x10E6/uL (ref 3.77–5.28)
RDW: 13.4 % (ref 11.7–15.4)
WBC: 8.8 10*3/uL (ref 3.4–10.8)

## 2023-04-15 LAB — HEMOGLOBIN A1C
Est. average glucose Bld gHb Est-mCnc: 126 mg/dL
Hgb A1c MFr Bld: 6 % — ABNORMAL HIGH (ref 4.8–5.6)

## 2023-04-15 LAB — LIPID PANEL WITH LDL/HDL RATIO
Cholesterol, Total: 176 mg/dL (ref 100–199)
HDL: 39 mg/dL — ABNORMAL LOW (ref >39)
LDL Chol Calc (NIH): 106 mg/dL — ABNORMAL HIGH (ref 0–99)
LDL/HDL Ratio: 2.7 {ratio} (ref 0.0–3.2)
Triglycerides: 178 mg/dL — ABNORMAL HIGH (ref 0–149)
VLDL Cholesterol Cal: 31 mg/dL (ref 5–40)

## 2023-04-15 LAB — COMPREHENSIVE METABOLIC PANEL
ALT: 39 [IU]/L — ABNORMAL HIGH (ref 0–32)
AST: 29 [IU]/L (ref 0–40)
Albumin: 4.6 g/dL (ref 3.8–4.9)
Alkaline Phosphatase: 154 [IU]/L — ABNORMAL HIGH (ref 44–121)
BUN/Creatinine Ratio: 16 (ref 9–23)
BUN: 10 mg/dL (ref 6–24)
Bilirubin Total: 1.7 mg/dL — ABNORMAL HIGH (ref 0.0–1.2)
CO2: 21 mmol/L (ref 20–29)
Calcium: 9.4 mg/dL (ref 8.7–10.2)
Chloride: 102 mmol/L (ref 96–106)
Creatinine, Ser: 0.61 mg/dL (ref 0.57–1.00)
Globulin, Total: 2.9 g/dL (ref 1.5–4.5)
Glucose: 93 mg/dL (ref 70–99)
Potassium: 4.5 mmol/L (ref 3.5–5.2)
Sodium: 140 mmol/L (ref 134–144)
Total Protein: 7.5 g/dL (ref 6.0–8.5)
eGFR: 107 mL/min/{1.73_m2} (ref >59)

## 2023-04-15 LAB — VITAMIN D 25 HYDROXY (VIT D DEFICIENCY, FRACTURES): Vit D, 25-Hydroxy: 28.1 ng/mL — ABNORMAL LOW (ref 30.0–100.0)

## 2023-04-15 LAB — TSH: TSH: 3.26 u[IU]/mL (ref 0.450–4.500)

## 2023-04-15 LAB — INSULIN, RANDOM: INSULIN: 42.9 u[IU]/mL — ABNORMAL HIGH (ref 2.6–24.9)

## 2023-04-15 LAB — VITAMIN B12: Vitamin B-12: 582 pg/mL (ref 232–1245)

## 2023-04-15 LAB — T4, FREE: Free T4: 1.11 ng/dL (ref 0.82–1.77)

## 2023-04-15 LAB — FOLATE: Folate: 10.9 ng/mL (ref >3.0)

## 2023-04-26 ENCOUNTER — Encounter (INDEPENDENT_AMBULATORY_CARE_PROVIDER_SITE_OTHER): Payer: Self-pay | Admitting: Family Medicine

## 2023-04-26 ENCOUNTER — Ambulatory Visit (INDEPENDENT_AMBULATORY_CARE_PROVIDER_SITE_OTHER): Payer: BC Managed Care – PPO | Admitting: Family Medicine

## 2023-04-26 VITALS — BP 124/80 | HR 74 | Temp 98.2°F | Ht 65.0 in | Wt 268.0 lb

## 2023-04-26 DIAGNOSIS — E785 Hyperlipidemia, unspecified: Secondary | ICD-10-CM | POA: Diagnosis not present

## 2023-04-26 DIAGNOSIS — R7303 Prediabetes: Secondary | ICD-10-CM | POA: Diagnosis not present

## 2023-04-26 DIAGNOSIS — E66813 Obesity, class 3: Secondary | ICD-10-CM

## 2023-04-26 DIAGNOSIS — G4733 Obstructive sleep apnea (adult) (pediatric): Secondary | ICD-10-CM | POA: Diagnosis not present

## 2023-04-26 DIAGNOSIS — I159 Secondary hypertension, unspecified: Secondary | ICD-10-CM

## 2023-04-26 DIAGNOSIS — K760 Fatty (change of) liver, not elsewhere classified: Secondary | ICD-10-CM

## 2023-04-26 DIAGNOSIS — F39 Unspecified mood [affective] disorder: Secondary | ICD-10-CM

## 2023-04-26 DIAGNOSIS — E559 Vitamin D deficiency, unspecified: Secondary | ICD-10-CM

## 2023-04-26 DIAGNOSIS — Z6841 Body Mass Index (BMI) 40.0 and over, adult: Secondary | ICD-10-CM

## 2023-04-26 DIAGNOSIS — E669 Obesity, unspecified: Secondary | ICD-10-CM

## 2023-04-26 DIAGNOSIS — E569 Vitamin deficiency, unspecified: Secondary | ICD-10-CM

## 2023-04-26 MED ORDER — VITAMIN D (ERGOCALCIFEROL) 1.25 MG (50000 UNIT) PO CAPS: 50000.0000 [IU] | ORAL_CAPSULE | ORAL | 0 refills | Status: DC

## 2023-04-26 NOTE — Progress Notes (Incomplete)
[name], D.O.  ABFM, ABOM Clinical Bariatric Medicine Physician  Office located at: [address] [place], [place]  [address]     Assessment and Plan:   FOR THE DISEASE OF OBESITY: BMI 45.0-49.9, adult (HCC) Obesity, current BMI 44.7 Assessment & Plan: Since last office visit on 04/12/23 patient's  Muscle mass has decreased by 1.6lb. Fat mass has decreased by 1.6lb. Total body water has increased by 3lb.  Counseling done on how various foods will affect these numbers and how to maximize success  Total lbs lost to date: 3 lbs Total weight loss percentage to date: 1.11%   Recommended Dietary Goals [name] is currently in the action stage of change. As such, her goal is to continue weight management plan. She has agreed to:  Follow Category 2 meal plan with added lunch options   Behavioral Intervention We discussed the following Behavioral Modification Strategies today: increasing lean protein intake to established goals, decreasing simple carbohydrates , avoiding skipping meals, increasing water intake , practice mindfulness eating and understand the difference between hunger signals and cravings, and celebration eating strategies  Additional resources provided today: Handout on practicing mindfulness around eating  Evidence-based interventions for health behavior change were utilized today including the discussion of self monitoring techniques, problem-solving barriers and SMART goal setting techniques.   Regarding patient's less desirable eating habits and patterns, we employed the technique of small changes.   Pt will specifically work on: Measure protein and vegetables for next visit.   Recommended Physical Activity Goals [name] has been advised to work up to 150 minutes of moderate intensity aerobic activity a week and strengthening exercises 2-3 times per week for cardiovascular health, weight loss maintenance and preservation of muscle mass.   She has agreed  to :  Increase physical activity in their day and reduce sedentary time (increase NEAT).   Pharmacotherapy We discussed various medication options to help [name] with her weight loss efforts and we both agreed to : continue with nutritional and behavioral strategies   FOR ASSOCIATED CONDITIONS ADDRESSED TODAY: Secondary hypertension Assessment & Plan: BP Readings from Last 3 Encounters:  04/26/23 124/80  04/12/23 (!) 139/90  03/20/23 110/68   BP is stable today. Condition being treated with amlodipine 5 mg for HTN. Tolerating well with no side effects. No concerns reported today.   Continue with current antihypertensive regimen as directed by PCP/specialists. Continue to follow up with PCP/management and we will monitor her condition alongside these individuals.    Pre-diabetes Assessment & Plan: Lab Results  Component Value Date   HGBA1C 6.0 (H) 04/14/2023   HGBA1C 6.0 (H) 02/06/2023   HGBA1C 5.8 (H) 10/16/2020   INSULIN 42.9 (H) 04/14/2023   INSULIN 26.1 (H) 12/04/2019    Pt A1C and insulin are above goal at 6.0 and 42.9 respectively as of latest labs obtained on 04/14/23. [name] has been measuring her food portions, but notes it has not been consistent.   Reviewed ideal insulin levels of below 5 with patient, she verbalized understanding. Extensively educated pt on insulin stimulating hunger/cravings pathways and the importance of protein to help stabilize sugars levels and promote control over hunger/cravings. I recommend she continue measuring her foods on a more consistent basis. Avoid skipping meals. Prioritize eating green leafy vegetables and non-starch vegetables. Educated pt on importance of meeting lean protein intake as it pertains to her weight loss and reducing the progression of her condition from prediabetes to diabetes. Educated pt how losing weight and decreasing  adipose tissue can help stabilize sugars. I recommend she research more about this condition on  American Diabetes Association.   Hyperlipidemia, unspecified hyperlipidemia type Assessment & Plan: Lab Results  Component Value Date   CHOL 176 04/14/2023   HDL 39 (L) 04/14/2023   LDLCALC 106 (H) 04/14/2023   LDLDIRECT 98 02/06/2023   TRIG 178 (H) 04/14/2023   CHOLHDL 3.6 02/28/2022   Pt is on atorvastatin 20 mg and Colestid 1 g BID as needed. Tolerating both medications well and reports no current side effects.   Educated pt on the differences between HDL, LDL, triglycerides. Benefits of weight loss should improve results of lipid panel. Encouraged [name] to eat on plan, prioritize lean protein intake, and increase exercise in an effort to improve her condition. I recommend patient continue education on the American Heart Association website. Continue current medication regimen per PCP/specialists. Will continue to monitor her condition alongside PCP/specialists as it relates to her weight loss.    Mood disorder (HCC)- emotional eating Assessment & Plan: Pt endorses emotional eating. She is scheduled to see Dr. Dewaine Conger on 05/08/23. Moods overall stable. Denies SI/HI.   Thoroughly reviewed mindful eating handout with patient. Avoid eating in front of screens (phone, computer, or TVs), prioritize eating at the table, and try taking sips of water between bites while eating to slow eating habits. I recommend she follow prudent nutritional plan and create healthy eating habits. Continue following up with Dr. Dewaine Conger for counseling and management of emotional eating. We will continue to monitor her condition.    Metabolic dysfunction-associated steatotic liver disease (MASLD) Assessment & Plan: Pt reports eating off-plan fatty foods prior to her last lab work. She denies history of cholecystectomy. Results from her most recent abdominal ultrasound revealed fatty liver with Dr. Adela Lank. Her visceral fat rating was 17 today.  Reviewed liver enzymes and BUN of latest obtained labs.  Extensively educated patient on fatty liver and possible options to manage her condition. Reviewed ideal goal of 11 or less for visceral fat rating females. I recommend she prioritize efforts to lose fat mass and avoid skipping meals. With weight loss, pt's condition should improve.    Vitamin D insufficiency Assessment & Plan: Lab Results  Component Value Date   VD25OH 28.1 (L) 04/14/2023   VD25OH 21.5 (L) 02/06/2023   VD25OH 34.2 10/16/2020   Madyn's vitamin D levels were below goal at 28.1 as of 04/14/23. She is on ERGO 50K units once weekly and notes she has missed her last 2 doses. Overall she is tolerating ERGO well with no reported side effects.   Reviewed ideal levels of 50+. Reviewed effects of vitamin D insufficiency such as stimulation of hunger/craving pathways that contribute to weight gain and low energy. Stressed the importance of staying compliant to vitamin D supplementation. Continue on current supplementation regimen.  Refill ERGO today.   Orders: -     Vitamin D (Ergocalciferol); Take 1 capsule (50,000 Units total) by mouth every 7 (seven) days.  Dispense: 4 capsule; Refill: 0  FOLLOW UP:   Return in about 22 days (around 05/18/2023). She was informed of the importance of frequent follow up visits to maximize her success with intensive lifestyle modifications for her multiple health conditions.   Subjective:   Chief complaint: Obesity Eldena is here to discuss her progress with her obesity treatment plan. She is on the Category 2 Plan and states she is following her eating plan approximately 85-90 % of the time. She states she is exercising .  Interval History:  Nakeitha Farney is here today for her first follow-up office visit since starting the program with [place].  Since last office visit she is down 3. She was off plan while traveling for thanksgiving but overall has been eating on plan.   All blood work/ lab tests that were recently ordered by myself or an  outside provider were reviewed with patient today per their request. Extended time was spent counseling her on all new disease processes that were discovered or preexisting ones that are affected by BMI.  she understands that many of these abnormalities will need to monitored regularly along with the current treatment plan of prudent dietary changes, in which we are making each and every office visit, to improve these health parameters.  Pharmacotherapy for weight loss: She is not currently taking medications  for medical weight loss.  Denies side effects.    Review of Systems:  Pertinent positives were addressed with patient today.  Reviewed by clinician on day of visit: allergies, medications, problem list, medical history, surgical history, family history, social history, and previous encounter notes.   Weight Summary and Biometrics   Weight Lost Since Last Visit: 3 lb  Weight Gained Since Last Visit: 0   Vitals Temp: 98.2 F (36.8 C) BP: 124/80 Pulse Rate: 74 SpO2: 96 %   Anthropometric Measurements Height: 5\' 5"  (1.651 m) Weight: 268 lb (121.6 kg) BMI (Calculated): 44.6 Weight at Last Visit: 271 lb Weight Lost Since Last Visit: 3 lb Weight Gained Since Last Visit: 0 Starting Weight: 271 lb Total Weight Loss (lbs): 3 lb (1.361 kg) Peak Weight: 284 lb   Body Composition  Body Fat %: 50.5 % Fat Mass (lbs): 135.6 lbs Muscle Mass (lbs): 126.2 lbs Total Body Water (lbs): 99.8 lbs Visceral Fat Rating : 17   Other Clinical Data RMR: [id number] Fasting: no Labs: no Today's Visit #: 2 Starting Date: 04/12/23     Objective:   PHYSICAL EXAM:  Blood pressure 124/80, pulse 74, temperature 98.2 F (36.8 C), height 5\' 5"  (1.651 m), weight 268 lb (121.6 kg), SpO2 96%. Body mass index is 44.6 kg/m.  General: she is overweight, cooperative and in no acute distress.   HEENT: EOMI, sclerae are anicteric. Lungs: Normal breathing effort, no conversational dyspnea. M-Sk:   Normal gross ROM * 4 extremities  PSYCH: Has normal mood, affect and thought process. Neurologic: No gross sensory or motor deficits. Well developed, A and O * 3  DIAGNOSTIC DATA REVIEWED:  BMET    Component Value Date/Time   NA 140 04/14/2023 0959   K 4.5 04/14/2023 0959   CL 102 04/14/2023 0959   CO2 21 04/14/2023 0959   GLUCOSE 93 04/14/2023 0959   GLUCOSE 110 (H) 04/22/2022 0643   BUN 10 04/14/2023 0959   CREATININE 0.61 04/14/2023 0959   CALCIUM 9.4 04/14/2023 0959   GFRNONAA 109 12/04/2019 1327   GFRAA 125 12/04/2019 1327   Lab Results  Component Value Date   HGBA1C 6.0 (H) 04/14/2023   HGBA1C 5.4 04/27/2017   Lab Results  Component Value Date   INSULIN 42.9 (H) 04/14/2023   INSULIN 26.1 (H) 12/04/2019   Lab Results  Component Value Date   TSH 3.260 04/14/2023   CBC    Component Value Date/Time   WBC 8.8 04/14/2023 0959   WBC 8.6 02/23/2022 1116   RBC 5.00 04/14/2023 0959   RBC 4.93 02/23/2022 1116   HGB 14.9 04/14/2023 0959   HCT 44.7 04/14/2023 0959  PLT 304 04/14/2023 0959   MCV 89 04/14/2023 0959   MCH 29.8 04/14/2023 0959   MCH 30.1 05/01/2019 0301   MCHC 33.3 04/14/2023 0959   MCHC 33.7 02/23/2022 1116   RDW 13.4 04/14/2023 0959   Iron Studies    Component Value Date/Time   IRON 70 09/20/2019 0828   TIBC 359 09/20/2019 0828   FERRITIN 82 09/20/2019 0828   IRONPCTSAT 19 09/20/2019 0828   Lipid Panel     Component Value Date/Time   CHOL 176 04/14/2023 0959   TRIG 178 (H) 04/14/2023 0959   HDL 39 (L) 04/14/2023 0959   CHOLHDL 3.6 02/28/2022 1135   LDLCALC 106 (H) 04/14/2023 0959   LDLDIRECT 98 02/06/2023 1428   Hepatic Function Panel     Component Value Date/Time   PROT 7.5 04/14/2023 0959   ALBUMIN 4.6 04/14/2023 0959   AST 29 04/14/2023 0959   ALT 39 (H) 04/14/2023 0959   ALKPHOS 154 (H) 04/14/2023 0959   BILITOT 1.7 (H) 04/14/2023 0959   BILIDIR 0.1 10/15/2020 1136      Component Value Date/Time   TSH 3.260 04/14/2023  0959   Nutritional Lab Results  Component Value Date   VD25OH 28.1 (L) 04/14/2023   VD25OH 21.5 (L) 02/06/2023   VD25OH 34.2 10/16/2020    Attestations:   Reviewed by clinician on day of visit: allergies, medications, problem list, medical history, surgical history, family history, social history, and previous encounter notes pertinent to patient's obesity diagnosis.   I have spent 60 minutes in the care of the patient today including: preparing to see patient (e.g. review and interpretation of tests, old notes ), obtaining and/or reviewing separately obtained history, performing a medically appropriate examination or evaluation, counseling and educating the patient, ordering medications, test or procedures, documenting clinical information in the electronic or other health care record, and independently interpreting results and communicating results to the patient, family, or caregiver   I, Isabelle Course, acting as a medical scribe for Thomasene Lot, DO., have compiled all relevant documentation for today's office visit on behalf of Thomasene Lot, DO, while in the presence of [employer], DO.  I have reviewed the above documentation for accuracy and completeness, and I agree with the above. [name], D.O.  The 21st Century Cures Act was signed into law in 2016 which includes the topic of electronic health records.  This provides immediate access to information in MyChart.  This includes consultation notes, operative notes, office notes, lab results and pathology reports.  If you have any questions about what you read please let us know at your next visit so we can discuss your concerns and take corrective action if need be.  We are right here with you.

## 2023-05-03 DIAGNOSIS — G43719 Chronic migraine without aura, intractable, without status migrainosus: Secondary | ICD-10-CM | POA: Diagnosis not present

## 2023-05-03 DIAGNOSIS — M542 Cervicalgia: Secondary | ICD-10-CM | POA: Diagnosis not present

## 2023-05-04 DIAGNOSIS — Z20822 Contact with and (suspected) exposure to covid-19: Secondary | ICD-10-CM | POA: Diagnosis not present

## 2023-05-04 DIAGNOSIS — J101 Influenza due to other identified influenza virus with other respiratory manifestations: Secondary | ICD-10-CM | POA: Diagnosis not present

## 2023-05-08 ENCOUNTER — Encounter: Payer: BC Managed Care – PPO | Admitting: Nurse Practitioner

## 2023-05-08 ENCOUNTER — Telehealth (INDEPENDENT_AMBULATORY_CARE_PROVIDER_SITE_OTHER): Payer: BC Managed Care – PPO | Admitting: Psychology

## 2023-05-08 NOTE — Progress Notes (Unsigned)
Office: [phone]  /  Fax: [phone]    Date: May 08, 2023    Appointment Start Time: *** Duration: *** minutes Provider: Lawerance Cruel, Psy.D. Type of Session: Intake for Individual Therapy  Location of Patient: {gbptloc:23249} (private location) Location of Provider: Provider's home (private office) Type of Contact: Telepsychological Visit via MyChart Video Visit  Informed Consent: Prior to proceeding with today's appointment, two pieces of identifying information were obtained. In addition, Mareli's physical location at the time of this appointment was obtained as well a phone number she could be reached at in the event of technical difficulties. Marit and this provider participated in today's telepsychological service.   The provider's role was explained to [facility]. The provider reviewed and discussed issues of confidentiality, privacy, and limits therein (e.g., reporting obligations). In addition to verbal informed consent, written informed consent for psychological services was obtained prior to the initial appointment. Since the clinic is not a 24/7 crisis center, mental health emergency resources were shared and this  provider explained MyChart, e-mail, voicemail, and/or other messaging systems should be utilized only for non-emergency reasons. This provider also explained that information obtained during appointments will be placed in Arnetia's medical record and relevant information will be shared with other providers at [facility] at any locations for coordination of care. Lutricia agreed information may be shared with other [facility] providers as needed for coordination of care and by signing the service agreement document, she provided written consent for coordination of care. Prior to initiating telepsychological services, Kamiko completed an informed consent document, which included the development of a safety plan (i.e., an  emergency contact and emergency resources) in the event of an emergency/crisis. Shardai verbally acknowledged understanding she is ultimately responsible for understanding her insurance benefits for telepsychological and in-person services. This provider also reviewed confidentiality, as it relates to telepsychological services. Aryella  acknowledged understanding that appointments cannot be recorded without both party consent and she is aware she is responsible for securing confidentiality on her end of the session. Nokomis verbally consented to proceed.  Chief Complaint/HPI: Ananya was referred by Dr. Thomasene Lot due to mood disorder-emotional eating. Per the note for the visit with Dr. Thomasene Lot on 04/12/2023, "Assessment: Condition is Not optimized. Denies any SI/HI. Mood is not stable.She was diagnosed with BPD and feels as this is not true as she was diagnosed during a hard time with surgeries and the death of her daughters father. She feels as she was just having a mental breakdown at that time. She tends to eat when she is stressed, sad, angry, guilty, bored, as a reward, for comfort, and to stay awake. She tends to sneak food so she can't be seen eating it. She feels guilt and self heat when overeating or making poor food decisions as she feels its a waste of money and her IBS flares when doing so. She has felt judged growing up and feels out of control with eating. She suspects she has a binge eating disorder. Pt feels almost everyday that her extra weight decreases her interest in things she used to enjoy, decrease energy, has effected her sleep, makes her feel depressed, effects her mood, and increased her fatigue.."   During today's appointment, Matilyn was verbally administered a questionnaire assessing various behaviors related to emotional eating behaviors. Krissi endorsed the following: {gbmoodandfood:21755}. She shared she craves ***. Manesha believes the onset of emotional eating  behaviors was *** and described the current frequency of  emotional eating behaviors as ***. In addition, Icelyn {gblegal:22371} a history of binge eating behaviors. *** Currently, Arly indicated ***. Furthermore, Bosie Clos {gblegal:22371} other problems of concern. ***   Mental Status Examination:  Appearance: {Appearance:22431} Behavior: {Behavior:22445} Mood: {gbmood:21757} Affect: {Affect:22436} Speech: {Speech:22432} Eye Contact: {Eye Contact:22433} Psychomotor Activity: {Motor Activity:22434} Gait: unable to assess  Thought Process: {thought process:22448}  Thought Content/Perception: {disturbances:22451} Orientation: {Orientation:22437} Memory/Concentration: {gbcognition:22449} Insight/Judgment: {Insight:22446}  Family & Psychosocial History: Salome reported she is *** and ***. She indicated she is currently ***. Additionally, Lyrique shared her highest level of education obtained is ***. Currently, Reyne's social support system consists of her ***. Moreover, Lucyna stated she resides with her ***.   Medical History:  Past Medical History:  Diagnosis Date   ADHD    B12 deficiency    Binge eating disorder    Bipolar disease, chronic (HCC)    Chronic diarrhea    Class 3 severe obesity with serious comorbidity and body mass index (BMI) of 40.0 to 44.9 in adult [facility]) 08/31/2021   Endometriosis    Epigastric hernia    Fatty liver    followed by dr Adela Lank   (last abd ultrasound in epic 02-11-2022)   GAD (generalized anxiety disorder)    GERD (gastroesophageal reflux disease)    Hemorrhoids    History of anal fissures    History of cancer chemotherapy    FOR CERVICAL CANCER , COMPLETED 08-2013   History of cervical cancer in adulthood 04/2013   gyn oncologist--- dr Ivory Broad;   dx 12/ 2014 invasive cervical cancer--- Stage IB2--- 06-11-2013  s/p  TAH w/ BSO and pelvic node dissection,  completed chemoradiation 04/ 2015   History of colitis 06/24/2019   History of radiation  therapy    FOR CERVICAL CANCER , COMPLETED 08-2013   History of uterine fibroid    Hyperlipidemia, mixed    Hypertension    Irritable bowel syndrome with diarrhea    GI--  dr Adela Lank   Lactose intolerance    Migraines    Nodule of upper lobe of left lung    last CT chest 05-15-2017 (care everywhere in epic)    OA (osteoarthritis)    OSA on CPAP    study in epic 02-24-2015  mild osa,  uses cpap nightly,  followed by [facility]   Prediabetes    SOB (shortness of breath)    [name] (stress urinary incontinence, female)    Vitamin D deficiency    Past Surgical History:  Procedure Laterality Date   COLONOSCOPY  03/2017   dr Jolene Provost HERNIA REPAIR N/A 04/22/2022   Procedure: OPEN EPIGASTRIC HERNIA REPAIR WITH MESH;  Surgeon: Dossie Der, Hyman Hopes, MD;  Location: [facility];  Service: General;  Laterality: N/A;   EVALUATION UNDER ANESTHESIA WITH ANAL FISTULECTOMY N/A 07/06/2017   Procedure: ANAL EXAM UNDER ANESTHESIA;  Surgeon: Romie Levee, MD;  Location: [facility];  Service: General;  Laterality: N/A;   EXCISION OF BREAST BIOPSY Right    2010 and 2012   (both benign)   INCISION AND DRAINAGE PERIRECTAL ABSCESS N/A 04/26/2017   Procedure: IRRIGATION AND DEBRIDEMENT PERIRECTAL ABSCESS;  Surgeon: Luretha Murphy, MD;  Location: [facility];  Service: General;  Laterality: N/A;   LAPAROSCOPY INCISIONAL HERNIA REPAIR W/ MESH, LYSIS ADHESIONS  01/10/2014   @[facility]    LIGATION OF INTERNAL FISTULA TRACT N/A 03/08/2018   Procedure: LIGATION OF INTERNAL FISTULA TRACT;  Surgeon: Romie Levee, MD;  Location: [facility];  Service: General;  Laterality: N/A;   PLACEMENT OF SETON N/A 07/06/2017   Procedure: PLACEMENT OF SETON;  Surgeon: Romie Levee, MD;  Location: [facility];  Service: General;  Laterality: N/A;   TOTAL ABDOMINAL HYSTERECTOMY W/ BILATERAL SALPINGOOPHORECTOMY  06/11/2013   @[facility]  by dr Festus Holts;     W/  PELVIC LYMPH NODE DISSECTIONS   TOTAL KNEE ARTHROPLASTY Left 04/29/2019   Procedure: TOTAL KNEE ARTHROPLASTY;  Surgeon: Ollen Gross, MD;  Location: [facility];  Service: Orthopedics;  Laterality: Left;    TOTAL KNEE ARTHROPLASTY Right 01/2020   @[facility]  by dr Lequita Halt   WISDOM TOOTH EXTRACTION     Current Outpatient Medications on File Prior to Visit  Medication Sig Dispense Refill   acetaminophen (TYLENOL) 500 MG tablet Take 500 mg by mouth as needed.     AMBULATORY NON FORMULARY MEDICATION Continuous positive airway pressure (CPAP) device: Auto titrate to maximum of 20 cm H2O with pressure. Please provide all supplemental supplies as needed. Facemask preferred. 1 Units 1   amLODipine (NORVASC) 5 MG tablet TAKE 1 TABLET(5 MG) BY MOUTH DAILY 90 tablet 1   aspirin-acetaminophen-caffeine (EXCEDRIN MIGRAINE) 250-250-65 MG tablet Take 1 tablet by mouth every 6 (six) hours as needed for headache.     atorvastatin (LIPITOR) 20 MG tablet TAKE 1 TABLET(20 MG) BY MOUTH DAILY 90 tablet 0   chlorproMAZINE (THORAZINE) 25 MG tablet Take 25 mg by mouth daily as needed.     colestipol (COLESTID) 1 g tablet Take 1 g by mouth 2 (two) times daily as needed (chronic diarrhea).     CRANBERRY PO Take by mouth as needed.     famotidine (PEPCID) 10 MG tablet Take 10 mg by mouth 2 (two) times daily as needed for heartburn or indigestion.     ibuprofen (ADVIL) 200 MG tablet Take 200 mg by mouth every 6 (six) hours as needed.     Multiple Vitamins-Minerals (CENTRUM SILVER PO) Take by mouth.     OVER THE COUNTER MEDICATION Nutrafol Womens balance     Vitamin D, Ergocalciferol, (DRISDOL) 1.25 MG (50000 UNIT) CAPS capsule Take 1 capsule (50,000 Units total) by mouth every 7 (seven) days. 4 capsule 0   No current facility-administered medications on file prior to visit.    Mental Health History: Japonica reported ***. She {gblegal:22371} a history of psychotropic medications. Mirena {Endorse or deny of item:23407}  hospitalizations for psychiatric concerns. Natoria {gblegal:22371} a family history of mental health/substance abuse related concerns. *** Nayla {Endorse or deny of item:23407} trauma including {gbtrauma:22071} abuse, as well as neglect. Bosie Clos described her typical mood lately as ***. Aside from concerns noted above and endorsed on the PHQ-9 and GAD-7, Ainsleigh reported ***. Jenilyn {gblegal:22371} current alcohol use. *** She {gblegal:22371} tobacco use. *** She {gblegal:22371} illicit/recreational substance use. Furthermore, Yeva indicated she is not experiencing the following: {gbsxs:21965}. She also denied history of and current suicidal ideation, plan, and intent; history of and current homicidal ideation, plan, and intent; and history of and current engagement in self-harm.  Legal History: Chyanne {Endorse or deny of item:23407} legal involvement.   Structured Assessments Results: The Patient Health Questionnaire-9 (PHQ-9) is a self-report measure that assesses symptoms and severity of depression over the course of the last two weeks. Lafayette obtained a score of *** suggesting {GBPHQ9SEVERITY:21752}. Kentra finds the endorsed symptoms to be {gbphq9difficulty:21754}. [0= Not at all; 1= Several days; 2= More than half the days; 3= Nearly every day] Little interest or  pleasure in doing things ***  Feeling down, depressed, or hopeless ***  Trouble falling or staying asleep, or sleeping too much ***  Feeling tired or having little energy ***  Poor appetite or overeating ***  Feeling bad about yourself --- or that you are a failure or have let yourself or your family down ***  Trouble concentrating on things, such as reading the newspaper or watching television ***  Moving or speaking so slowly that other people could have noticed? Or the opposite --- being so fidgety or restless that you have been moving around a lot more than usual ***  Thoughts that you would be better off dead or hurting  yourself in some way ***  PHQ-9 Score ***    The Generalized Anxiety Disorder-7 (GAD-7) is a brief self-report measure that assesses symptoms of anxiety over the course of the last two weeks. Irelyn obtained a score of *** suggesting {gbgad7severity:21753}. Amane finds the endorsed symptoms to be {gbphq9difficulty:21754}. [0= Not at all; 1= Several days; 2= Over half the days; 3= Nearly every day] Feeling nervous, anxious, on edge ***  Not being able to stop or control worrying ***  Worrying too much about different things ***  Trouble relaxing ***  Being so restless that it's hard to sit still ***  Becoming easily annoyed or irritable ***  Feeling afraid as if something awful might happen ***  GAD-7 Score ***   Interventions:  {Interventions List for Intake:23406}  Diagnostic Impressions & Provisional DSM-5 Diagnosis(es): Based on the aforementioned, the following diagnosis(es) were assigned: {Diagnoses:22752}.  Plan: Elanna appears able and willing to participate as evidenced by engagement in reciprocal conversation and asking questions as needed for clarification. The next appointment is scheduled for *** at ***, which will be via MyChart Video Visit. The following treatment goal was established: {gbtxgoals:21759}. This provider will regularly review the treatment plan and medical chart to keep informed of status changes. Kodee expressed understanding and agreement with the initial treatment plan of care. *** Xzavia will be sent a handout via e-mail to utilize between now and the next appointment to increase awareness of hunger patterns and subsequent eating. Elodie provided verbal consent during today's appointment for this provider to send the handout via e-mail. ***

## 2023-05-15 ENCOUNTER — Encounter: Payer: BC Managed Care – PPO | Admitting: Nurse Practitioner

## 2023-05-18 ENCOUNTER — Ambulatory Visit (INDEPENDENT_AMBULATORY_CARE_PROVIDER_SITE_OTHER): Payer: BC Managed Care – PPO | Admitting: Adult Health

## 2023-05-18 ENCOUNTER — Encounter (INDEPENDENT_AMBULATORY_CARE_PROVIDER_SITE_OTHER): Payer: Self-pay | Admitting: Adult Health

## 2023-05-18 VITALS — BP 144/84 | HR 75 | Temp 98.4°F | Ht 65.0 in | Wt 267.0 lb

## 2023-05-18 DIAGNOSIS — E669 Obesity, unspecified: Secondary | ICD-10-CM | POA: Diagnosis not present

## 2023-05-18 DIAGNOSIS — Z6841 Body Mass Index (BMI) 40.0 and over, adult: Secondary | ICD-10-CM

## 2023-05-18 DIAGNOSIS — I159 Secondary hypertension, unspecified: Secondary | ICD-10-CM

## 2023-05-18 DIAGNOSIS — K51819 Other ulcerative colitis with unspecified complications: Secondary | ICD-10-CM | POA: Diagnosis not present

## 2023-05-18 DIAGNOSIS — R7303 Prediabetes: Secondary | ICD-10-CM | POA: Diagnosis not present

## 2023-05-18 NOTE — Progress Notes (Signed)
 WEIGHT SUMMARY AND BIOMETRICS  Vitals Temp: 98.4 F (36.9 C) BP: (!) 144/84 Pulse Rate: 75 SpO2: 96 %   Anthropometric Measurements Height: 5' 5 (1.651 m) Weight: 267 lb (121.1 kg) BMI (Calculated): 44.43 Weight at Last Visit: 268 lb Weight Lost Since Last Visit: 1 lb Weight Gained Since Last Visit: 0 Starting Weight: 271 lb Total Weight Loss (lbs): 4 lb (1.814 kg) Peak Weight: 284 lb   Body Composition  Body Fat %: 49.5 % Fat Mass (lbs): 132.2 lbs Muscle Mass (lbs): 128 lbs Total Body Water  (lbs): 94 lbs Visceral Fat Rating : 16   Other Clinical Data RMR: 2188-05-03 Fasting: no Labs: no Today's Visit #: 3 Starting Date: 04/12/23    Chief Complaint:   OBESITY [name] is here to discuss her progress with her obesity treatment plan. She is on the the Category 2 Plan and states she is following her eating plan approximately 5 % of the time. She states she is exercising NEAT Activities.   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  Subjective:   1. Other ulcerative colitis with complication (HCC) Per pt- she was previously on Metformin  therapy- significant GI upset (loose stools). This eventually caused anal fissure. She denies current GI upset at present.  2. Pre-diabetes Lab Results  Component Value Date   HGBA1C 6.0 (H) 04/14/2023   HGBA1C 6.0 (H) 02/06/2023   HGBA1C 5.8 (H) 10/16/2020    She is not currently on any antidiabetic medications She previously on Metformin  therapy- significant GI upset (loose stools). This  eventually caused anal fissure. 01/2023 PCP attempted Zepbound  therapy- not covered by her insurance  3. Secondary hypertension She is currently on colestipol  (COLESTID ) 1 g tablet  amLODipine  (NORVASC ) 5 MG tablet  atorvastatin  (LIPITOR) 20 MG tablet    Assessment/Plan:   1. Other ulcerative colitis with complication (HCC) Avoid known trigger foods F/u with GI as directed  2. Pre-diabetes (Primary) Continue sot reduce sugar/simple CHO Increase daily activity, ie: Walking and NEAT Actitivities  3. Secondary hypertension Continue colestipol  (COLESTID ) 1 g tablet  amLODipine  (NORVASC ) 5 MG tablet  atorvastatin  (LIPITOR) 20 MG tablet   4. Obesity, current BMI 44.43  [name] is currently in the action stage of change. As such, her goal is to continue with weight loss efforts. She has agreed to the Category 2 Plan.   Exercise goals: All adults should avoid inactivity. Some physical activity is better than none, and adults who participate in any amount of physical activity gain some health benefits. Adults should also include muscle-strengthening activities that involve all major muscle groups on 2 or more days a week.  Behavioral modification strategies: increasing lean protein intake, decreasing simple carbohydrates, increasing vegetables, increasing water  intake, meal planning and cooking strategies, keeping healthy foods in the home, ways to avoid boredom eating, holiday eating strategies , and planning for success.  [name] has agreed to follow-up with our clinic in 2 weeks. She was informed of the importance of frequent follow-up visits to maximize her success with intensive lifestyle modifications  for her multiple health conditions.   Objective:   Blood pressure (!) 144/84, pulse 75, temperature 98.4 F (36.9 C), height 5' 5 (1.651 m), weight 267 lb (121.1 kg), SpO2 96%. Body mass index is 44.43 kg/m.  General: Cooperative, alert, well developed, in no acute  distress. HEENT: Conjunctivae and lids unremarkable. Cardiovascular: Regular rhythm.  Lungs: Normal work of breathing. Neurologic: No focal deficits.   Lab Results  Component Value Date   CREATININE 0.61 04/14/2023   BUN 10 04/14/2023   NA 140 04/14/2023   K 4.5 04/14/2023   CL 102 04/14/2023   CO2 21 04/14/2023   Lab Results  Component Value Date   ALT 39 (H) 04/14/2023   AST 29 04/14/2023   ALKPHOS 154 (H) 04/14/2023   BILITOT 1.7 (H) 04/14/2023   Lab Results  Component Value Date   HGBA1C 6.0 (H) 04/14/2023   HGBA1C 6.0 (H) 02/06/2023   HGBA1C 5.8 (H) 10/16/2020   HGBA1C 5.5 09/20/2019   HGBA1C 5.4 06/24/2019   Lab Results  Component Value Date   INSULIN  42.9 (H) 04/14/2023   INSULIN  26.1 (H) 12/04/2019   Lab Results  Component Value Date   TSH 3.260 04/14/2023   Lab Results  Component Value Date   CHOL 176 04/14/2023   HDL 39 (L) 04/14/2023   LDLCALC 106 (H) 04/14/2023   LDLDIRECT 98 02/06/2023   TRIG 178 (H) 04/14/2023   CHOLHDL 3.6 02/28/2022   Lab Results  Component Value Date   VD25OH 28.1 (L) 04/14/2023   VD25OH 21.5 (L) 02/06/2023   VD25OH 34.2 10/16/2020   Lab Results  Component Value Date   WBC 8.8 04/14/2023   HGB 14.9 04/14/2023   HCT 44.7 04/14/2023   MCV 89 04/14/2023   PLT 304 04/14/2023   Lab Results  Component Value Date   IRON 70 09/20/2019   TIBC 359 09/20/2019   FERRITIN 82 09/20/2019    Attestation Statements:   Reviewed by clinician on day of visit: allergies, medications, problem list, medical history, surgical history, family history, social history, and previous encounter notes.  Time spent on visit including pre-visit chart review and post-visit care and charting was 31 minutes.   I have reviewed the above documentation for accuracy and completeness, and I agree with the above. -  Maurice Fotheringham d. Dominic Rhome, NP-C

## 2023-05-19 ENCOUNTER — Ambulatory Visit: Payer: BC Managed Care – PPO | Admitting: Nurse Practitioner

## 2023-05-19 ENCOUNTER — Encounter: Payer: Self-pay | Admitting: Nurse Practitioner

## 2023-05-19 VITALS — BP 132/82 | HR 70 | Wt 272.6 lb

## 2023-05-19 DIAGNOSIS — F50819 Binge eating disorder, unspecified: Secondary | ICD-10-CM

## 2023-05-19 DIAGNOSIS — K580 Irritable bowel syndrome with diarrhea: Secondary | ICD-10-CM

## 2023-05-19 DIAGNOSIS — I10 Essential (primary) hypertension: Secondary | ICD-10-CM

## 2023-05-19 DIAGNOSIS — G4733 Obstructive sleep apnea (adult) (pediatric): Secondary | ICD-10-CM

## 2023-05-19 DIAGNOSIS — F902 Attention-deficit hyperactivity disorder, combined type: Secondary | ICD-10-CM | POA: Insufficient documentation

## 2023-05-19 DIAGNOSIS — F5101 Primary insomnia: Secondary | ICD-10-CM

## 2023-05-19 DIAGNOSIS — E782 Mixed hyperlipidemia: Secondary | ICD-10-CM

## 2023-05-19 DIAGNOSIS — F4323 Adjustment disorder with mixed anxiety and depressed mood: Secondary | ICD-10-CM | POA: Diagnosis not present

## 2023-05-19 MED ORDER — LISDEXAMFETAMINE DIMESYLATE 30 MG PO CAPS: 30.0000 mg | ORAL_CAPSULE | Freq: Every day | ORAL | 0 refills | Status: DC

## 2023-05-19 MED ORDER — COLESTIPOL HCL 1 G PO TABS: 1.0000 g | ORAL_TABLET | Freq: Two times a day (BID) | ORAL | 3 refills | Status: DC | PRN

## 2023-05-19 MED ORDER — ESCITALOPRAM OXALATE 10 MG PO TABS: 10.0000 mg | ORAL_TABLET | Freq: Every day | ORAL | 3 refills | Status: DC

## 2023-05-19 MED ORDER — TRAZODONE HCL 50 MG PO TABS: 25.0000 mg | ORAL_TABLET | Freq: Every evening | ORAL | 3 refills | Status: AC | PRN

## 2023-05-19 NOTE — Patient Instructions (Signed)
 Let's see how the trazodone and ADHD medication work. If this works ok for you then we can add the Lexapro if needed.   If you have any negative side effects please let me know immediately.

## 2023-05-19 NOTE — Progress Notes (Signed)
 Alison Doing, DNP, AGNP-c [facility]  [address] [place], [place] [address] [phone]  ESTABLISHED PATIENT- Chronic Health and/or Follow-Up Visit  Blood pressure 132/82, pulse 70, weight 272 lb 9.6 oz (123.7 kg).    [name] is a 53 y.o. year old female presenting today for evaluation and management of chronic conditions.   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 She reports waking up around 4 am to use the bathroom and struggling to fall back asleep due to racing thoughts. The patient has been using Z-Quil to manage her sleep issues. She also reports a history of multiple surgeries over the past 13 years, which has led to significant financial strain and emotional distress. Her emotional well-being appears to be significantly impacted by her work environment, which she describes as stressful and emotionally draining due to the behavior of both students and fellow staff members. She expresses a desire to lose weight and improve her overall health but feels overwhelmed by her current circumstances.  She has previously tried various antidepressants, including Prozac  and Zoloft , without significant improvement in her mood. She also reports a history of binge eating and weight gain, particularly following the death of her daughter's father in 2021.  She has a history of hypertension and hyperlipidemia, for which she takes a blood pressure medication and a statin, respectively. She also takes colestipol  for irritable bowel syndrome (IBS).   The patient has previously been diagnosed with ADHD, which she believes may be contributing to her difficulty focusing and staying on task. She has tried Concerta and Vyvanse  in the past for this condition.   [name] is due for evaluation of her CPAP effectiveness and usage. She reports using her CPAP nightly with complete  inability to sleep without it. She reports use of CPAP has improved her daytime brain fog and ADHD symptoms. She feels that the machine is effective and would like to continue using this.   All ROS negative with exception of what is listed above.   PHYSICAL EXAM Physical Exam Vitals and nursing note reviewed.  Constitutional:      Appearance: Normal appearance. She is obese.  HENT:     Head: Normocephalic.  Eyes:     Pupils: Pupils are equal, round, and reactive to light.  Cardiovascular:     Rate and Rhythm: Normal rate and regular rhythm.     Pulses: Normal pulses.     Heart sounds: Normal heart sounds.  Pulmonary:     Effort: Pulmonary effort is normal.     Breath sounds: Normal breath sounds.  Musculoskeletal:        General: Normal range of motion.     Cervical back: Normal range of motion.  Skin:    General: Skin is warm.  Neurological:     General: No focal deficit present.     Mental Status: She is alert and oriented to person, place, and time.  Psychiatric:        Mood and Affect: Mood normal.      PLAN Problem List Items Addressed This Visit     OSA (obstructive sleep apnea) - Primary   Using CPAP nightly with good compliance and symptom improvement. No issues with claustrophobia or discomfort with the current mask. Recommend continuation of usage as this has been beneficial. Encourage at least 4-6 hours of use a night.  - Continue using CPAP nightly. - Check and replace CPAP equipment  as needed.      Essential hypertension   Managed with medication. Blood pressure slightly elevated today, possibly due to missed dose. - Continue current antihypertensive medication. - Monitor blood pressure at home.      Relevant Medications   colestipol  (COLESTID ) 1 g tablet   Irritable bowel syndrome with diarrhea   Managed with colestipol  as needed. No new issues reported. - Continue colestipol  as needed.      Relevant Medications   colestipol  (COLESTID ) 1 g tablet    Binge eating disorder   See ADHD from same encounter      Hyperlipidemia, unspecified   Managed with a statin. No new issues reported. - Continue current statin therapy.      Relevant Medications   colestipol  (COLESTID ) 1 g tablet   Attention deficit hyperactivity disorder (ADHD), combined type   Difficulty staying on task and focusing, potentially exacerbated by emotional stress. Previous trials of Concerta and Vyvanse  with some benefit. Discussed Vyvanse  for its dual role in managing ADHD and binge eating. Informed consent provided, including potential side effects and the goal of improving focus and reducing binge eating. - Prescribe Vyvanse .      Relevant Medications   lisdexamfetamine (VYVANSE ) 30 MG capsule   Primary insomnia   Chronic difficulty staying asleep, often waking up at 4 AM and unable to fall back asleep. Currently using Z-Quil without significant improvement. Discussed trazodone  for sleep, noting its efficacy and minimal side effects. Informed consent provided, including potential side effects and the goal of improving sleep without next-day grogginess. - Prescribe trazodone  25-100 mg, starting with 50 mg tonight and adjusting as needed.       Relevant Medications   traZODone  (DESYREL ) 50 MG tablet   Adjustment reaction with anxiety and depression   Trials of multiple medications (Prozac , Zoloft , Vibrate) without significant improvement. Discussed Lexapro , noting its efficacy for patients unresponsive to Zoloft . Informed consent provided, including potential side effects and the goal of improving mood. - Prescribe Lexapro .      Relevant Medications   escitalopram  (LEXAPRO ) 10 MG tablet    Return in about 4 weeks (around 06/16/2023) for virtual , Med Management 30.  Alison Doing, DNP, AGNP-c

## 2023-05-24 NOTE — Assessment & Plan Note (Signed)
 Using CPAP nightly with good compliance and symptom improvement. No issues with claustrophobia or discomfort with the current mask. Recommend continuation of usage as this has been beneficial. Encourage at least 4-6 hours of use a night.  - Continue using CPAP nightly. - Check and replace CPAP equipment as needed.

## 2023-05-24 NOTE — Assessment & Plan Note (Signed)
 Trials of multiple medications (Prozac , Zoloft , Vibrate) without significant improvement. Discussed Lexapro , noting its efficacy for patients unresponsive to Zoloft . Informed consent provided, including potential side effects and the goal of improving mood. - Prescribe Lexapro .

## 2023-05-24 NOTE — Assessment & Plan Note (Signed)
 Managed with colestipol as needed. No new issues reported. - Continue colestipol as needed.

## 2023-05-24 NOTE — Assessment & Plan Note (Signed)
 Chronic difficulty staying asleep, often waking up at 4 AM and unable to fall back asleep. Currently using Z-Quil without significant improvement. Discussed trazodone  for sleep, noting its efficacy and minimal side effects. Informed consent provided, including potential side effects and the goal of improving sleep without next-day grogginess. - Prescribe trazodone  25-100 mg, starting with 50 mg tonight and adjusting as needed.

## 2023-05-24 NOTE — Assessment & Plan Note (Signed)
 Managed with medication. Blood pressure slightly elevated today, possibly due to missed dose. - Continue current antihypertensive medication. - Monitor blood pressure at home.

## 2023-05-24 NOTE — Assessment & Plan Note (Signed)
 Managed with a statin. No new issues reported. - Continue current statin therapy.

## 2023-05-24 NOTE — Assessment & Plan Note (Signed)
 See ADHD from same encounter

## 2023-05-24 NOTE — Assessment & Plan Note (Signed)
 Difficulty staying on task and focusing, potentially exacerbated by emotional stress. Previous trials of Concerta and Vyvanse  with some benefit. Discussed Vyvanse  for its dual role in managing ADHD and binge eating. Informed consent provided, including potential side effects and the goal of improving focus and reducing binge eating. - Prescribe Vyvanse .

## 2023-05-27 DIAGNOSIS — G4733 Obstructive sleep apnea (adult) (pediatric): Secondary | ICD-10-CM | POA: Diagnosis not present

## 2023-05-30 ENCOUNTER — Other Ambulatory Visit: Payer: Self-pay | Admitting: Nurse Practitioner

## 2023-06-05 ENCOUNTER — Ambulatory Visit (INDEPENDENT_AMBULATORY_CARE_PROVIDER_SITE_OTHER): Payer: BC Managed Care – PPO | Admitting: Family Medicine

## 2023-06-05 ENCOUNTER — Encounter (INDEPENDENT_AMBULATORY_CARE_PROVIDER_SITE_OTHER): Payer: Self-pay | Admitting: Family Medicine

## 2023-06-05 ENCOUNTER — Other Ambulatory Visit: Payer: Self-pay | Admitting: Nurse Practitioner

## 2023-06-05 ENCOUNTER — Encounter: Payer: Self-pay | Admitting: Nurse Practitioner

## 2023-06-05 VITALS — BP 154/93 | HR 75 | Temp 98.0°F | Ht 65.0 in | Wt 267.0 lb

## 2023-06-05 DIAGNOSIS — E559 Vitamin D deficiency, unspecified: Secondary | ICD-10-CM

## 2023-06-05 DIAGNOSIS — I10 Essential (primary) hypertension: Secondary | ICD-10-CM | POA: Diagnosis not present

## 2023-06-05 DIAGNOSIS — E669 Obesity, unspecified: Secondary | ICD-10-CM

## 2023-06-05 DIAGNOSIS — F902 Attention-deficit hyperactivity disorder, combined type: Secondary | ICD-10-CM

## 2023-06-05 DIAGNOSIS — Z6841 Body Mass Index (BMI) 40.0 and over, adult: Secondary | ICD-10-CM

## 2023-06-05 MED ORDER — LISDEXAMFETAMINE DIMESYLATE 30 MG PO CAPS: 30.0000 mg | ORAL_CAPSULE | Freq: Every day | ORAL | 0 refills | Status: DC

## 2023-06-05 MED ORDER — VITAMIN D (ERGOCALCIFEROL) 1.25 MG (50000 UNIT) PO CAPS: 50000.0000 [IU] | ORAL_CAPSULE | ORAL | 0 refills | Status: AC

## 2023-06-05 NOTE — Progress Notes (Signed)
[name], D.O.  ABFM, ABOM Specializing in Clinical Bariatric Medicine  Office located at: [address] [place], [place]  [address]   Assessment and Plan:   FOR THE DISEASE OF OBESITY: Obesity, current BMI 44.43 BMI 45.0-49.9, adult [facility]) Assessment & Plan: Since last office visit on 05/18/23 patient's muscle mass has decreased by 0.2 lb. Fat mass has increased by 0.6 lb. Total body water has decreased by 0.6 lb.  Counseling done on how various foods will affect these numbers and how to maximize success  Total lbs lost to date: 4 lbs  Total weight loss percentage to date: 1.48%    Recommended Dietary Goals [name] is currently in the action stage of change. As such, her goal is to continue weight management plan.  She has agreed to: continue current plan   Behavioral Intervention We discussed the following today: increasing water intake , work on meal planning and preparation, and keeping healthy foods at home  Additional resources provided today: None  Evidence-based interventions for health behavior change were utilized today including the discussion of self monitoring techniques, problem-solving barriers and SMART goal setting techniques.   Regarding patient's less desirable eating habits and patterns, we employed the technique of small changes.   Pt will specifically work on: n/a   Recommended Physical Activity Goals [name] has been advised to work up to 150 minutes of moderate intensity aerobic activity a week and strengthening exercises 2-3 times per week for cardiovascular health, weight loss maintenance and preservation of muscle mass.   She has agreed to : Think about enjoyable ways to increase daily physical activity and overcoming barriers to exercise   Pharmacotherapy We both agreed to :  continue with nutritional and behavioral strategies.    FOR ASSOCIATED CONDITIONS ADDRESSED TODAY:  Essential hypertension Assessment & Plan: Last 3 blood  pressure readings in our office are as follows: BP Readings from Last 3 Encounters:  06/05/23 (!) 154/93  05/19/23 132/82  05/18/23 (!) 144/84   HTN treated with Norvasc 5 mg daily. Blood pressure elevated today - in fact it has been elevated in the last few office visits here. Pt thinks her BP maybe up today since she's experiencing abdominal pain secondary to an IBS flair up. Discussed goal BP: 130/80 or less. Pt instructed to obtain an upper arm BP cuff - check/record BP at random times of the day  until PCP visit on 06/15/2023. If BP consistently above 130/80 - contact PCP. Continue low sodium diet and exercise.    Vitamin D insufficiency Assessment & Plan: She is currently on ERGO 50,000 units once weekly, compliance and tolerance good. Continue weight loss efforts and current supplementation regimen. Will refill supplement today.   Orders: -     Vitamin D (Ergocalciferol); Take 1 capsule (50,000 Units total) by mouth every 7 (seven) days.  Dispense: 4 capsule; Refill: 0   Follow up:   Return 07/05/2023 with Alison Hamburger NP. She was informed of the importance of frequent follow up visits to maximize her success with intensive lifestyle modifications for her multiple health conditions.  Subjective:   Chief complaint: Obesity [name] is here to discuss her progress with her obesity treatment plan. She is on the Category 2 Plan and states she is following her eating plan approximately 40% of the time. She states she is walking more throughout the day.   Interval History:  [name] is here for a follow up office visit. Pt endorses she is content  with not gaining weight since LOV because she overindulged on a few occasions. She drinks roughly 40 ounces of water daily. She is doing better with walking more throughout the day.   Pt has diagnosis of OSA. She is 100% compliant with her CPAP. Reports that she is feeling more rested during the day. I did mention that GLP-1 medicines  can be covered through insurance with pt's that have OSA & obesity. Pt not interested in injectables anytime soon.  Pharmacotherapy for weight loss: She is currently taking no anti-obesity medication.   Review of Systems:  Pertinent positives were addressed with patient today.  Reviewed by clinician on day of visit: allergies, medications, problem list, medical history, surgical history, family history, social history, and previous encounter notes.  Weight Summary and Biometrics   Weight Lost Since Last Visit: 0  Weight Gained Since Last Visit: 0  Vitals Temp: 98 F (36.7 C) BP: (!) 154/93 Pulse Rate: 75 SpO2: 96 %   Anthropometric Measurements Height: 5\' 5"  (1.651 m) Weight: 267 lb (121.1 kg) BMI (Calculated): 44.43 Weight at Last Visit: 267 lb Weight Lost Since Last Visit: 0 Weight Gained Since Last Visit: 0 Starting Weight: 271 lb Total Weight Loss (lbs): 4 lb (1.814 kg) Peak Weight: 284 lb   Body Composition  Body Fat %: 49.7 % Fat Mass (lbs): 132.8 lbs Muscle Mass (lbs): 127.8 lbs Total Body Water (lbs): 93.4 lbs Visceral Fat Rating : 16   Other Clinical Data Today's Visit #: 4 Starting Date: 04/12/23   Objective:   PHYSICAL EXAM: Blood pressure (!) 154/93, pulse 75, temperature 98 F (36.7 C), height 5\' 5"  (1.651 m), weight 267 lb (121.1 kg), SpO2 96%. Body mass index is 44.43 kg/m.  General: she is overweight, cooperative and in no acute distress. PSYCH: Has normal mood, affect and thought process.   HEENT: EOMI, sclerae are anicteric. Lungs: Normal breathing effort, no conversational dyspnea. Extremities: Moves * 4 Neurologic: A and O * 3, good insight  DIAGNOSTIC DATA REVIEWED: BMET    Component Value Date/Time   NA 140 04/14/2023 0959   K 4.5 04/14/2023 0959   CL 102 04/14/2023 0959   CO2 21 04/14/2023 0959   GLUCOSE 93 04/14/2023 0959   GLUCOSE 110 (H) 04/22/2022 0643   BUN 10 04/14/2023 0959   CREATININE 0.61 04/14/2023 0959    CALCIUM 9.4 04/14/2023 0959   GFRNONAA 109 12/04/2019 1327   GFRAA 125 12/04/2019 1327   Lab Results  Component Value Date   HGBA1C 6.0 (H) 04/14/2023   HGBA1C 5.4 04/27/2017   Lab Results  Component Value Date   INSULIN 42.9 (H) 04/14/2023   INSULIN 26.1 (H) 12/04/2019   Lab Results  Component Value Date   TSH 3.260 04/14/2023   CBC    Component Value Date/Time   WBC 8.8 04/14/2023 0959   WBC 8.6 02/23/2022 1116   RBC 5.00 04/14/2023 0959   RBC 4.93 02/23/2022 1116   HGB 14.9 04/14/2023 0959   HCT 44.7 04/14/2023 0959   PLT 304 04/14/2023 0959   MCV 89 04/14/2023 0959   MCH 29.8 04/14/2023 0959   MCH 30.1 05/01/2019 0301   MCHC 33.3 04/14/2023 0959   MCHC 33.7 02/23/2022 1116   RDW 13.4 04/14/2023 0959   Iron Studies    Component Value Date/Time   IRON 70 09/20/2019 0828   TIBC 359 09/20/2019 0828   FERRITIN 82 09/20/2019 0828   IRONPCTSAT 19 09/20/2019 0828   Lipid Panel  Component Value Date/Time   CHOL 176 04/14/2023 0959   TRIG 178 (H) 04/14/2023 0959   HDL 39 (L) 04/14/2023 0959   CHOLHDL 3.6 02/28/2022 1135   LDLCALC 106 (H) 04/14/2023 0959   LDLDIRECT 98 02/06/2023 1428   Hepatic Function Panel     Component Value Date/Time   PROT 7.5 04/14/2023 0959   ALBUMIN 4.6 04/14/2023 0959   AST 29 04/14/2023 0959   ALT 39 (H) 04/14/2023 0959   ALKPHOS 154 (H) 04/14/2023 0959   BILITOT 1.7 (H) 04/14/2023 0959   BILIDIR 0.1 10/15/2020 1136      Component Value Date/Time   TSH 3.260 04/14/2023 0959   Nutritional Lab Results  Component Value Date   VD25OH 28.1 (L) 04/14/2023   VD25OH 21.5 (L) 02/06/2023   VD25OH 34.2 10/16/2020    Attestations:   I, Special Puri, acting as a Stage manager for Thomasene Lot, DO., have compiled all relevant documentation for today's office visit on behalf of Thomasene Lot, DO, while in the presence of [employer], DO.  I have reviewed the above documentation for accuracy and completeness, and I  agree with the above. [name], D.O.  The 21st Century Cures Act was signed into law in 2016 which includes the topic of electronic health records.  This provides immediate access to information in MyChart.  This includes consultation notes, operative notes, office notes, lab results and pathology reports.  If you have any questions about what you read please let us know at your next visit so we can discuss your concerns and take corrective action if need be.  We are right here with you.

## 2023-06-15 ENCOUNTER — Telehealth: Payer: BC Managed Care – PPO | Admitting: Nurse Practitioner

## 2023-06-21 DIAGNOSIS — H93293 Other abnormal auditory perceptions, bilateral: Secondary | ICD-10-CM | POA: Diagnosis not present

## 2023-06-21 DIAGNOSIS — H6123 Impacted cerumen, bilateral: Secondary | ICD-10-CM | POA: Diagnosis not present

## 2023-06-27 DIAGNOSIS — G4733 Obstructive sleep apnea (adult) (pediatric): Secondary | ICD-10-CM | POA: Diagnosis not present

## 2023-07-03 ENCOUNTER — Encounter: Payer: Self-pay | Admitting: Nurse Practitioner

## 2023-07-03 ENCOUNTER — Ambulatory Visit (INDEPENDENT_AMBULATORY_CARE_PROVIDER_SITE_OTHER): Payer: BC Managed Care – PPO | Admitting: Adult Health

## 2023-07-04 ENCOUNTER — Other Ambulatory Visit: Payer: Self-pay | Admitting: Nurse Practitioner

## 2023-07-04 DIAGNOSIS — F902 Attention-deficit hyperactivity disorder, combined type: Secondary | ICD-10-CM

## 2023-07-04 MED ORDER — LISDEXAMFETAMINE DIMESYLATE 40 MG PO CAPS
40.0000 mg | ORAL_CAPSULE | Freq: Every day | ORAL | 0 refills | Status: DC
Start: 2023-07-04 — End: 2023-09-07

## 2023-07-05 ENCOUNTER — Ambulatory Visit (INDEPENDENT_AMBULATORY_CARE_PROVIDER_SITE_OTHER): Payer: BC Managed Care – PPO | Admitting: Adult Health

## 2023-07-11 ENCOUNTER — Other Ambulatory Visit (INDEPENDENT_AMBULATORY_CARE_PROVIDER_SITE_OTHER): Payer: Self-pay | Admitting: Family Medicine

## 2023-07-11 DIAGNOSIS — E559 Vitamin D deficiency, unspecified: Secondary | ICD-10-CM

## 2023-07-13 ENCOUNTER — Encounter: Payer: Self-pay | Admitting: Nurse Practitioner

## 2023-07-26 ENCOUNTER — Ambulatory Visit (INDEPENDENT_AMBULATORY_CARE_PROVIDER_SITE_OTHER): Payer: BC Managed Care – PPO | Admitting: Adult Health

## 2023-08-30 DIAGNOSIS — L820 Inflamed seborrheic keratosis: Secondary | ICD-10-CM | POA: Diagnosis not present

## 2023-09-07 ENCOUNTER — Other Ambulatory Visit: Payer: Self-pay | Admitting: Nurse Practitioner

## 2023-09-07 DIAGNOSIS — F902 Attention-deficit hyperactivity disorder, combined type: Secondary | ICD-10-CM

## 2023-09-07 MED ORDER — LISDEXAMFETAMINE DIMESYLATE 40 MG PO CAPS: 40.0000 mg | ORAL_CAPSULE | Freq: Every day | ORAL | 0 refills | Status: DC

## 2023-09-07 MED ORDER — LISDEXAMFETAMINE DIMESYLATE 40 MG PO CAPS: 40.0000 mg | ORAL_CAPSULE | Freq: Every day | ORAL | 0 refills | Status: AC

## 2023-09-07 NOTE — Telephone Encounter (Signed)
 Last apt 05/19/23

## 2023-09-11 DIAGNOSIS — G43719 Chronic migraine without aura, intractable, without status migrainosus: Secondary | ICD-10-CM | POA: Diagnosis not present

## 2023-09-13 DIAGNOSIS — M542 Cervicalgia: Secondary | ICD-10-CM | POA: Diagnosis not present

## 2023-09-13 DIAGNOSIS — G43719 Chronic migraine without aura, intractable, without status migrainosus: Secondary | ICD-10-CM | POA: Diagnosis not present

## 2023-10-13 DIAGNOSIS — H43812 Vitreous degeneration, left eye: Secondary | ICD-10-CM | POA: Diagnosis not present

## 2023-10-16 DIAGNOSIS — Z6841 Body Mass Index (BMI) 40.0 and over, adult: Secondary | ICD-10-CM | POA: Diagnosis not present

## 2023-10-16 DIAGNOSIS — R051 Acute cough: Secondary | ICD-10-CM | POA: Diagnosis not present

## 2023-10-16 DIAGNOSIS — J988 Other specified respiratory disorders: Secondary | ICD-10-CM | POA: Diagnosis not present

## 2023-10-16 DIAGNOSIS — J069 Acute upper respiratory infection, unspecified: Secondary | ICD-10-CM | POA: Diagnosis not present

## 2023-10-18 DIAGNOSIS — G4733 Obstructive sleep apnea (adult) (pediatric): Secondary | ICD-10-CM | POA: Diagnosis not present

## 2023-11-21 DIAGNOSIS — D2271 Melanocytic nevi of right lower limb, including hip: Secondary | ICD-10-CM | POA: Diagnosis not present

## 2023-11-21 DIAGNOSIS — L738 Other specified follicular disorders: Secondary | ICD-10-CM | POA: Diagnosis not present

## 2023-11-21 DIAGNOSIS — D2272 Melanocytic nevi of left lower limb, including hip: Secondary | ICD-10-CM | POA: Diagnosis not present

## 2023-11-21 DIAGNOSIS — C441192 Basal cell carcinoma of skin of left lower eyelid, including canthus: Secondary | ICD-10-CM | POA: Diagnosis not present

## 2023-11-21 DIAGNOSIS — C44319 Basal cell carcinoma of skin of other parts of face: Secondary | ICD-10-CM | POA: Diagnosis not present

## 2023-11-21 DIAGNOSIS — L821 Other seborrheic keratosis: Secondary | ICD-10-CM | POA: Diagnosis not present

## 2023-11-30 DIAGNOSIS — F50811 Binge eating disorder, moderate: Secondary | ICD-10-CM | POA: Diagnosis not present

## 2023-11-30 DIAGNOSIS — F4323 Adjustment disorder with mixed anxiety and depressed mood: Secondary | ICD-10-CM | POA: Diagnosis not present

## 2023-12-02 ENCOUNTER — Other Ambulatory Visit: Payer: Self-pay | Admitting: Medical

## 2023-12-04 NOTE — Telephone Encounter (Signed)
 She is overdue for a med check with [facility], please schedule. I will refill medication

## 2023-12-12 DIAGNOSIS — G43719 Chronic migraine without aura, intractable, without status migrainosus: Secondary | ICD-10-CM | POA: Diagnosis not present

## 2023-12-13 DIAGNOSIS — G43719 Chronic migraine without aura, intractable, without status migrainosus: Secondary | ICD-10-CM | POA: Diagnosis not present

## 2023-12-13 DIAGNOSIS — M542 Cervicalgia: Secondary | ICD-10-CM | POA: Diagnosis not present

## 2023-12-26 DIAGNOSIS — C441192 Basal cell carcinoma of skin of left lower eyelid, including canthus: Secondary | ICD-10-CM | POA: Diagnosis not present

## 2023-12-27 DIAGNOSIS — F4323 Adjustment disorder with mixed anxiety and depressed mood: Secondary | ICD-10-CM | POA: Diagnosis not present

## 2023-12-27 DIAGNOSIS — F50811 Binge eating disorder, moderate: Secondary | ICD-10-CM | POA: Diagnosis not present

## 2024-01-03 DIAGNOSIS — F4323 Adjustment disorder with mixed anxiety and depressed mood: Secondary | ICD-10-CM | POA: Diagnosis not present

## 2024-01-03 DIAGNOSIS — F50811 Binge eating disorder, moderate: Secondary | ICD-10-CM | POA: Diagnosis not present

## 2024-01-11 DIAGNOSIS — F4323 Adjustment disorder with mixed anxiety and depressed mood: Secondary | ICD-10-CM | POA: Diagnosis not present

## 2024-01-11 DIAGNOSIS — F50811 Binge eating disorder, moderate: Secondary | ICD-10-CM | POA: Diagnosis not present

## 2024-01-25 DIAGNOSIS — F4323 Adjustment disorder with mixed anxiety and depressed mood: Secondary | ICD-10-CM | POA: Diagnosis not present

## 2024-01-29 DIAGNOSIS — G4733 Obstructive sleep apnea (adult) (pediatric): Secondary | ICD-10-CM | POA: Diagnosis not present

## 2024-01-30 DIAGNOSIS — M9903 Segmental and somatic dysfunction of lumbar region: Secondary | ICD-10-CM | POA: Diagnosis not present

## 2024-01-30 DIAGNOSIS — M9901 Segmental and somatic dysfunction of cervical region: Secondary | ICD-10-CM | POA: Diagnosis not present

## 2024-01-30 DIAGNOSIS — M6240 Contracture of muscle, unspecified site: Secondary | ICD-10-CM | POA: Diagnosis not present

## 2024-01-30 DIAGNOSIS — M9902 Segmental and somatic dysfunction of thoracic region: Secondary | ICD-10-CM | POA: Diagnosis not present

## 2024-02-01 DIAGNOSIS — M9903 Segmental and somatic dysfunction of lumbar region: Secondary | ICD-10-CM | POA: Diagnosis not present

## 2024-02-01 DIAGNOSIS — M6240 Contracture of muscle, unspecified site: Secondary | ICD-10-CM | POA: Diagnosis not present

## 2024-02-01 DIAGNOSIS — M9902 Segmental and somatic dysfunction of thoracic region: Secondary | ICD-10-CM | POA: Diagnosis not present

## 2024-02-06 DIAGNOSIS — M9903 Segmental and somatic dysfunction of lumbar region: Secondary | ICD-10-CM | POA: Diagnosis not present

## 2024-02-06 DIAGNOSIS — M6240 Contracture of muscle, unspecified site: Secondary | ICD-10-CM | POA: Diagnosis not present

## 2024-02-06 DIAGNOSIS — M9902 Segmental and somatic dysfunction of thoracic region: Secondary | ICD-10-CM | POA: Diagnosis not present

## 2024-02-07 DIAGNOSIS — R92322 Mammographic fibroglandular density, left breast: Secondary | ICD-10-CM | POA: Diagnosis not present

## 2024-02-07 DIAGNOSIS — R921 Mammographic calcification found on diagnostic imaging of breast: Secondary | ICD-10-CM | POA: Diagnosis not present

## 2024-02-07 DIAGNOSIS — R928 Other abnormal and inconclusive findings on diagnostic imaging of breast: Secondary | ICD-10-CM | POA: Diagnosis not present

## 2024-02-21 DIAGNOSIS — M9902 Segmental and somatic dysfunction of thoracic region: Secondary | ICD-10-CM | POA: Diagnosis not present

## 2024-02-21 DIAGNOSIS — M9903 Segmental and somatic dysfunction of lumbar region: Secondary | ICD-10-CM | POA: Diagnosis not present

## 2024-02-21 DIAGNOSIS — M6240 Contracture of muscle, unspecified site: Secondary | ICD-10-CM | POA: Diagnosis not present

## 2024-02-28 DIAGNOSIS — M9903 Segmental and somatic dysfunction of lumbar region: Secondary | ICD-10-CM | POA: Diagnosis not present

## 2024-02-28 DIAGNOSIS — M9902 Segmental and somatic dysfunction of thoracic region: Secondary | ICD-10-CM | POA: Diagnosis not present

## 2024-02-28 DIAGNOSIS — M6240 Contracture of muscle, unspecified site: Secondary | ICD-10-CM | POA: Diagnosis not present

## 2024-02-29 ENCOUNTER — Other Ambulatory Visit: Payer: Self-pay | Admitting: Nurse Practitioner

## 2024-03-01 DIAGNOSIS — R928 Other abnormal and inconclusive findings on diagnostic imaging of breast: Secondary | ICD-10-CM | POA: Diagnosis not present

## 2024-03-01 DIAGNOSIS — R921 Mammographic calcification found on diagnostic imaging of breast: Secondary | ICD-10-CM | POA: Diagnosis not present

## 2024-03-01 DIAGNOSIS — D0512 Intraductal carcinoma in situ of left breast: Secondary | ICD-10-CM | POA: Diagnosis not present

## 2024-03-01 DIAGNOSIS — Z170 Estrogen receptor positive status [ER+]: Secondary | ICD-10-CM | POA: Diagnosis not present

## 2024-03-04 DIAGNOSIS — G43719 Chronic migraine without aura, intractable, without status migrainosus: Secondary | ICD-10-CM | POA: Diagnosis not present

## 2024-03-06 DIAGNOSIS — M6240 Contracture of muscle, unspecified site: Secondary | ICD-10-CM | POA: Diagnosis not present

## 2024-03-06 DIAGNOSIS — M9903 Segmental and somatic dysfunction of lumbar region: Secondary | ICD-10-CM | POA: Diagnosis not present

## 2024-03-06 DIAGNOSIS — M9902 Segmental and somatic dysfunction of thoracic region: Secondary | ICD-10-CM | POA: Diagnosis not present

## 2024-03-13 DIAGNOSIS — G43719 Chronic migraine without aura, intractable, without status migrainosus: Secondary | ICD-10-CM | POA: Diagnosis not present

## 2024-03-13 DIAGNOSIS — M542 Cervicalgia: Secondary | ICD-10-CM | POA: Diagnosis not present

## 2024-03-20 DIAGNOSIS — C50912 Malignant neoplasm of unspecified site of left female breast: Secondary | ICD-10-CM | POA: Diagnosis not present

## 2024-03-20 DIAGNOSIS — Z6841 Body Mass Index (BMI) 40.0 and over, adult: Secondary | ICD-10-CM | POA: Diagnosis not present

## 2024-03-22 DIAGNOSIS — C50912 Malignant neoplasm of unspecified site of left female breast: Secondary | ICD-10-CM | POA: Diagnosis not present

## 2024-03-22 DIAGNOSIS — Z6841 Body Mass Index (BMI) 40.0 and over, adult: Secondary | ICD-10-CM | POA: Diagnosis not present

## 2024-04-02 DIAGNOSIS — Z8541 Personal history of malignant neoplasm of cervix uteri: Secondary | ICD-10-CM | POA: Diagnosis not present

## 2024-04-02 DIAGNOSIS — C50912 Malignant neoplasm of unspecified site of left female breast: Secondary | ICD-10-CM | POA: Diagnosis not present

## 2024-04-02 DIAGNOSIS — Z87891 Personal history of nicotine dependence: Secondary | ICD-10-CM | POA: Diagnosis not present

## 2024-04-02 DIAGNOSIS — Z136 Encounter for screening for cardiovascular disorders: Secondary | ICD-10-CM | POA: Diagnosis not present

## 2024-04-02 DIAGNOSIS — Z170 Estrogen receptor positive status [ER+]: Secondary | ICD-10-CM | POA: Diagnosis not present

## 2024-04-02 DIAGNOSIS — Z803 Family history of malignant neoplasm of breast: Secondary | ICD-10-CM | POA: Diagnosis not present

## 2024-04-02 DIAGNOSIS — D0512 Intraductal carcinoma in situ of left breast: Secondary | ICD-10-CM | POA: Diagnosis not present

## 2024-04-02 DIAGNOSIS — Z1721 Progesterone receptor positive status: Secondary | ICD-10-CM | POA: Diagnosis not present

## 2024-04-02 HISTORY — PX: BREAST LUMPECTOMY: SHX2

## 2024-04-10 DIAGNOSIS — C50912 Malignant neoplasm of unspecified site of left female breast: Secondary | ICD-10-CM | POA: Diagnosis not present

## 2024-04-25 DIAGNOSIS — D0512 Intraductal carcinoma in situ of left breast: Secondary | ICD-10-CM | POA: Diagnosis not present

## 2024-04-30 DIAGNOSIS — D0512 Intraductal carcinoma in situ of left breast: Secondary | ICD-10-CM | POA: Diagnosis not present

## 2024-05-01 DIAGNOSIS — F4323 Adjustment disorder with mixed anxiety and depressed mood: Secondary | ICD-10-CM | POA: Diagnosis not present

## 2024-05-04 DIAGNOSIS — F4323 Adjustment disorder with mixed anxiety and depressed mood: Secondary | ICD-10-CM | POA: Diagnosis not present

## 2024-05-06 ENCOUNTER — Ambulatory Visit: Admitting: Gastroenterology

## 2024-05-06 ENCOUNTER — Encounter: Payer: Self-pay | Admitting: Gastroenterology

## 2024-05-06 VITALS — BP 138/80 | HR 83 | Ht 64.5 in | Wt 270.4 lb

## 2024-05-06 DIAGNOSIS — Z6841 Body Mass Index (BMI) 40.0 and over, adult: Secondary | ICD-10-CM

## 2024-05-06 DIAGNOSIS — K580 Irritable bowel syndrome with diarrhea: Secondary | ICD-10-CM | POA: Diagnosis not present

## 2024-05-06 DIAGNOSIS — K625 Hemorrhage of anus and rectum: Secondary | ICD-10-CM

## 2024-05-06 DIAGNOSIS — K760 Fatty (change of) liver, not elsewhere classified: Secondary | ICD-10-CM | POA: Diagnosis not present

## 2024-05-06 MED ORDER — COLESTIPOL HCL 1 G PO TABS: ORAL_TABLET | ORAL | 3 refills | Status: AC

## 2024-05-06 MED ORDER — NA SULFATE-K SULFATE-MG SULF 17.5-3.13-1.6 GM/177ML PO SOLN: 1.0000 | Freq: Once | ORAL | 0 refills | Status: AC

## 2024-05-06 MED ORDER — DICYCLOMINE HCL 10 MG PO CAPS: ORAL_CAPSULE | ORAL | 1 refills | Status: DC

## 2024-05-06 NOTE — Patient Instructions (Addendum)
 You have been scheduled for a colonoscopy. Please follow written instructions given to you at your visit today.   If you use inhalers (even only as needed), please bring them with you on the day of your procedure.  DO NOT TAKE 7 DAYS PRIOR TO TEST- Trulicity (dulaglutide) Ozempic, Wegovy  (semaglutide ) Mounjaro , Zepbound  (tirzepatide ) Bydureon Bcise (exanatide extended release)  DO NOT TAKE 1 DAY PRIOR TO YOUR TEST Rybelsus (semaglutide ) Adlyxin (lixisenatide) Victoza  (liraglutide ) Byetta (exanatide) ___________________________________________________________________________  Please go to the lab in the basement of our building to have lab work done as you leave today. Hit B for basement when you get on the elevator.  When the doors open the lab is on your left.  We will call you with the results. Thank you.  We have sent the following medications to your pharmacy for you to pick up at your convenience: Bentyl : Take two to three times a day as needed Suprep - use for colonoscopy prep  Increase Colestid  to 2 tabs in the am and 1 tab in the pm  We are referring you to [facility].  They will contact you directly to schedule an appointment.  It may take a week or more before you hear from them.  Please feel free to contact us  if you have not heard from them within 2 weeks and we will follow up on the referral.   Thank you for entrusting me with your care and for choosing [facility], Dr. Elspeth Naval    _______________________________________________________  If your blood pressure at your visit was 140/90 or greater, please contact your primary care physician to follow up on this.  _______________________________________________________  If you are age 54 or older, your body mass index should be between 23-30. Your Body mass index is 45.69 kg/m. If this is out of the aforementioned range listed, please consider follow up with your Primary Care  Provider.  If you are age 54 or younger, your body mass index should be between 19-25. Your Body mass index is 45.69 kg/m. If this is out of the aformentioned range listed, please consider follow up with your Primary Care Provider.   ________________________________________________________  The [facility] providers would like to encourage you to use MYCHART to communicate with providers for non-urgent requests or questions.  Due to long hold times on the telephone, sending your provider a message by [facility] may be a faster and more efficient way to get a response.  Please allow 48 business hours for a response.  Please remember that this is for non-urgent requests.  _______________________________________________________  [facility] is using a team-based approach to care.  Your team is made up of your doctor and two to three APPS. Our APPS (Nurse Practitioners and Physician Assistants) work with your physician to ensure care continuity for you. They are fully qualified to address your health concerns and develop a treatment plan. They communicate directly with your gastroenterologist to care for you. Seeing the Advanced Practice Practitioners on your physician's team can help you by facilitating care more promptly, often allowing for earlier appointments, access to diagnostic testing, procedures, and other specialty referrals.

## 2024-05-06 NOTE — Progress Notes (Signed)
 "  HPI :  53 year old female here for follow-up visit for IBS-D, chronic diarrhea, fatty liver.  I have not seen her for a few years, she has been seen by Alan last year.  Recall she carries a diagnosis of IBS-D, this has been ongoing for many years.  She had a colonoscopy with Dr. Lennard in 2018 for this issue, biopsies negative for microscopic colitis, no polyps removed.  She has tested negative for celiac disease.  I had her on Viberzi  in the past, she had some drowsiness on this regimen and did not tolerate it but it did work to control her diarrhea.  In recent years I put her on Colestid .  She states this has definitely helped reduce her stool frequency.  If she does not take anything for her bowel she can have upwards of 12 bowel movements per day, sometimes with a rare nocturnal bowel movement.  On Colestid  1 g twice daily she has about 5 bowel movements per day.  This is much more tolerable for her but has not resolved it.  She has rare rectal bleeding she thinks from hemorrhoids.  She has a lot of abdominal pains and gas and bloating.  She was prescribed a course of rifaximin  at her last visit last year, she states she never took it or if there was an issue with insurance coverage, she cannot recall.  She feels like her bowel movements run her life and she has been increasingly more frustrated with her symptoms.  At times she does increase Colestid  and has taken upwards of 4 tabs per day which she states can bind her up and then she will go to the bathroom perhaps for a day at a time.  We discussed other options.  We otherwise discussed her history of fatty liver disease which has been noted on imaging.  She has not had LFTs checked in the past year.  She has a mild transaminitis and alk phos elevation over time.  She had a negative serologic workup for liver disease in 2021.  Her BMI is 45, she states no change in her weight over the past year.  She did try Mounjaro  in the past, she did lose 14  pounds on it but she states it actually made her diarrhea quite severe and worse and she could not tolerate it.  She thinks she was on it for about 3 months or so.  Since coming off it she regained weight.  She does not drink any alcohol.  We discussed options for treatment of that.  Otherwise, she unfortunately was diagnosed with early breast cancer in recent months.  She has had a resection of this with clear margins.  She is followed by oncology and scheduled to have chemotherapy and radiation therapy over the next few months to prevent recurrence.  She inquires about having another colonoscopy due to her bleeding symptoms and persistent diarrhea.  Prior workup: 2014 colonoscopy random biopsies negative for microscopic colitis, EGD showed negative celiac small bowel biopsies. 03/22/2017 colonoscopy Dr. Lennard [facility] showed sigmoid diverticulosis otherwise normal, biopsies negative microscopic colitis. Due in 2028.   2018 CT abdomen pelvis  unremarkable 07/31/2019 right upper quadrant ultrasound showed fatty liver, gallbladder polyps no acute changes. 12/28/20 CT AB and pelvis main findings are small ventral hernia that contains some fat but no evidence of complicating features. There is nothing else of note, seems normal otherwise.      CT abdomen / pelvis 03/03/22: IMPRESSION: 1. Small fat containing  supraumbilical ventral hernia. No definite recurrent umbilical hernia in the setting of prior repair. 2. Colonic diverticulosis with no acute diverticulitis 3.  Aortic Atherosclerosis (ICD10-I70.0). 4. Interval development of an indeterminate punctate sclerotic lesion within the S1 level. Recommend attention on follow-up.     Past Medical History:  Diagnosis Date   ADHD    B12 deficiency    Binge eating disorder    Bipolar disease, chronic (HCC)    Breast cancer    Cervical cancer (HCC)    Chronic diarrhea    Class 3 severe obesity with serious comorbidity and body mass index  (BMI) of 40.0 to 44.9 in adult [facility]) 08/31/2021   Endometriosis    Epigastric hernia    Fatty liver    followed by dr leigh   (last abd ultrasound in epic 02-11-2022)   GAD (generalized anxiety disorder)    GERD (gastroesophageal reflux disease)    Hemorrhoids    History of anal fissures    History of cancer chemotherapy    FOR CERVICAL CANCER , COMPLETED 08-2013   History of cervical cancer in adulthood 04/2013   gyn oncologist--- dr emerson sor;   dx 12/ 2014 invasive cervical cancer--- Stage IB2--- 06-11-2013  s/p  TAH w/ BSO and pelvic node dissection,  completed chemoradiation 04/ 2015   History of colitis 06/24/2019   History of radiation therapy    FOR CERVICAL CANCER , COMPLETED 08-2013   History of uterine fibroid    Hyperlipidemia, mixed    Hypertension    Irritable bowel syndrome with diarrhea    GI--  dr leigh   Lactose intolerance    Migraines    Nodule of upper lobe of left lung    last CT chest 05-15-2017 (care everywhere in epic)    OA (osteoarthritis)    OSA on CPAP    study in epic 02-24-2015  mild osa,  uses cpap nightly,  followed by [facility]   Prediabetes    SOB (shortness of breath)    [name] (stress urinary incontinence, female)    Vitamin D  deficiency      Past Surgical History:  Procedure Laterality Date   BREAST LUMPECTOMY Left 04/02/2024   COLONOSCOPY  03/2017   dr ganem   EPIGASTRIC HERNIA REPAIR N/A 04/22/2022   Procedure: OPEN EPIGASTRIC HERNIA REPAIR WITH MESH;  Surgeon: Lyndel, Deward PARAS, MD;  Location: [facility];  Service: General;  Laterality: N/A;   EVALUATION UNDER ANESTHESIA WITH ANAL FISTULECTOMY N/A 07/06/2017   Procedure: ANAL EXAM UNDER ANESTHESIA;  Surgeon: Debby Hila, MD;  Location: [facility];  Service: General;  Laterality: N/A;   EXCISION OF BREAST BIOPSY Right    2010 and 2012   (both benign)   INCISION AND DRAINAGE PERIRECTAL ABSCESS N/A 04/26/2017   Procedure: IRRIGATION AND  DEBRIDEMENT PERIRECTAL ABSCESS;  Surgeon: Gladis Cough, MD;  Location: [facility];  Service: General;  Laterality: N/A;   LAPAROSCOPY INCISIONAL HERNIA REPAIR W/ MESH, LYSIS ADHESIONS  01/10/2014   @[facility]    LIGATION OF INTERNAL FISTULA TRACT N/A 03/08/2018   Procedure: LIGATION OF INTERNAL FISTULA TRACT;  Surgeon: Debby Hila, MD;  Location: [facility] [place];  Service: General;  Laterality: N/A;   PLACEMENT OF SETON N/A 07/06/2017   Procedure: PLACEMENT OF SETON;  Surgeon: Debby Hila, MD;  Location: [facility] [place];  Service: General;  Laterality: N/A;   TOTAL ABDOMINAL HYSTERECTOMY W/ BILATERAL SALPINGOOPHORECTOMY  06/11/2013   @[facility]  by dr ozell sor;  W/ PELVIC LYMPH NODE DISSECTIONS   TOTAL KNEE ARTHROPLASTY Left 04/29/2019   Procedure: TOTAL KNEE ARTHROPLASTY;  Surgeon: Melodi Lerner, MD;  Location: [facility];  Service: Orthopedics;  Laterality: Left;    TOTAL KNEE ARTHROPLASTY Right 01/2020   @[facility]  by dr melodi   WISDOM TOOTH EXTRACTION     Family History  Problem Relation Age of Onset   Hyperlipidemia Mother    Hypertension Mother    Diabetes Father    Irritable bowel syndrome Father    Bladder Cancer Father 67   Hypertension Father    Hyperlipidemia Father    Cancer Father    Obesity Father    Diabetes Maternal Grandfather    Stroke Maternal Grandfather    Heart disease Maternal Grandfather        pacemaker    Colon cancer Paternal Uncle        ?   Pancreatic cancer Neg Hx    Stomach cancer Neg Hx    Esophageal cancer Neg Hx    Social History[1] Current Outpatient Medications  Medication Sig Dispense Refill   acetaminophen  (TYLENOL ) 500 MG tablet Take 500 mg by mouth as needed.     AMBULATORY NON FORMULARY MEDICATION Continuous positive airway pressure (CPAP) device: Auto titrate to maximum of 20 cm H2O with pressure. Please provide all supplemental supplies as needed. Facemask preferred. 1 Units 1   amLODipine  (NORVASC ) 5 MG  tablet TAKE 1 TABLET(5 MG) BY MOUTH DAILY 90 tablet 0   aspirin -acetaminophen -caffeine  (EXCEDRIN MIGRAINE) 250-250-65 MG tablet Take 1 tablet by mouth every 6 (six) hours as needed for headache.     atorvastatin  (LIPITOR) 20 MG tablet TAKE 1 TABLET(20 MG) BY MOUTH DAILY 90 tablet 1   chlorproMAZINE  (THORAZINE ) 25 MG tablet Take 25 mg by mouth daily as needed.     colestipol  (COLESTID ) 1 g tablet Take 1 tablet (1 g total) by mouth 2 (two) times daily as needed (chronic diarrhea). 180 tablet 3   CRANBERRY PO Take by mouth as needed.     diphenhydrAMINE  HCl, Sleep, (ZZZQUIL PO) Take by mouth.     famotidine  (PEPCID ) 10 MG tablet Take 10 mg by mouth 2 (two) times daily as needed for heartburn or indigestion.     ibuprofen  (ADVIL ) 200 MG tablet Take 200 mg by mouth every 6 (six) hours as needed.     Multiple Vitamins-Minerals (CENTRUM SILVER PO) Take by mouth.     Ondansetron  HCl (ZOFRAN  PO) Take by mouth as needed.     OVER THE COUNTER MEDICATION Nutrafol Womens balance     Vitamin D , Ergocalciferol , (DRISDOL ) 1.25 MG (50000 UNIT) CAPS capsule Take 1 capsule (50,000 Units total) by mouth every 7 (seven) days. 4 capsule 0   Dermatological Products, Misc. (STRATA XRT) GEL Apply topically. As directed (Patient not taking: Reported on 05/06/2024)     lisdexamfetamine  (VYVANSE ) 40 MG capsule Take 1 capsule (40 mg total) by mouth daily. (Patient not taking: Reported on 05/06/2024) 30 capsule 0   No current facility-administered medications for this visit.   Allergies[2]   Review of Systems: All systems reviewed and negative except where noted in HPI.   Lab Results  Component Value Date   ALT 39 (H) 04/14/2023   AST 29 04/14/2023   ALKPHOS 154 (H) 04/14/2023   BILITOT 1.7 (H) 04/14/2023    Lab Results  Component Value Date   NA 140 04/14/2023   CL 102 04/14/2023   K 4.5 04/14/2023   CO2 21 04/14/2023  BUN 10 04/14/2023   CREATININE 0.61 04/14/2023   EGFR 107 04/14/2023   CALCIUM  9.4  04/14/2023   ALBUMIN 4.6 04/14/2023   GLUCOSE 93 04/14/2023    Lab Results  Component Value Date   WBC 8.8 04/14/2023   HGB 14.9 04/14/2023   HCT 44.7 04/14/2023   MCV 89 04/14/2023   PLT 304 04/14/2023     Physical Exam: BP 138/80   Pulse 83   Ht 5' 4.5 (1.638 m)   Wt 270 lb 6 oz (122.6 kg)   BMI 45.69 kg/m  Constitutional: Pleasant,well-developed, female in no acute distress. Neurological: Alert and oriented to person place and time. Skin: Skin is warm and dry. No rashes noted. Psychiatric: Normal mood and affect. Behavior is normal.   ASSESSMENT: 53 y.o. female here for assessment of the following  1. Irritable bowel syndrome with diarrhea   2. Rectal bleeding   3. Metabolic dysfunction-associated steatotic liver disease (MASLD)   4. Morbid obesity (HCC)    Longstanding IBS-D.  We reviewed her course at length, treatment options in the past and what has worked for her over time.  She has definitely responded to Colestid  at baseline with significant reduction in stool frequency but continues to bother her at dosing of 1 tablet twice daily.  She has responded to higher dosing of this as needed.  Discussed options.  I think okay to increase her dosing of colestipol  daily to 3 tabs, 2 in the AM, 1 in the PM.  Also would add Bentyl  given her abdominal cramps, 10 mg 2-3 times daily as needed.  Lets see how she does on this regimen.  If she is not happy we can try to get a course of rifaximin  for her, she never thinks she took it previously.  We discussed other options as well.  I do not think she is a great candidate for TCA given her other medication she is on, due to potential interactions.  Could consider Lotronex in the future, we discussed risks of this, hopefully we can avoid that if possible, depends on her course.  She has some superficial rectal bleeding with the diarrhea.  Likely hemorrhoidal, but her last exam was 7 years ago.  Offered her optical colonoscopy to further  evaluate make sure no interval changes.  She does remind me she has had a history of perirectal abscess with fistula remotely.  There has been no evidence of Crohn's disease on prior workup but we will make sure of that and retest for microscopic colitis.  Following discussion of risks and benefits she wants to proceed with colonoscopy.  We additionally spent some time discussing MASLD.  She has had this for some time, with chronic elevation in her liver enzymes.  Discussed risk for fibrosis and cirrhosis moving forward.  Ultimately weight loss is recommended.  She has tried GLP-1 agonist in the past with side effects and did not tolerate it.  I do think she would benefit from seeing a dedicated weight loss clinic for calorie restricted diet and nutritional guidance for weight loss.  She is willing to do this and we will refer her to the [facility] loss center.  Otherwise, she is not drinking any alcohol.  She is due for basic LFTs and CBC, calculate fib 4.  I think given duration of her fatty liver a FibroScan would be useful to clarify risk for fibrosis.  We should have that at our office in the next 1 to 2 months, I  will put her on the wait list for scheduling.  She understands and agrees with the plan as outlined.   PLAN: - increase colestid  to 3 tabs per day (2 in the AM, 1 in the PM) - add Bentyl  10mg  BID TO TID PRN - consideration for Rifaximin  pending her course (she never took prior course) - Lotronix possibly in the future if fails other regimen - schedule Colonoscopy at the [facility]  - counseled on MASLD, risks for fibrosis / cirrhosis - wait list for fibroscan - LFTs and CBC today - discussed weight loss - she is fearful of GLP1 given reaction in the past - refer to [facility] - she can update me in a few weeks on progress, adjust regimen PRN  I spent 50 minutes of time, including in depth chart review, face-to-face time with the patient, and  documentation.  Marcey Naval, MD [facility]     [1]  Social History Tobacco Use   Smoking status: Former    Current packs/day: 0.00    Types: Cigarettes    Start date: 04/26/1992    Quit date: 04/26/1998    Years since quitting: 26.0   Smokeless tobacco: Never  Vaping Use   Vaping status: Never Used  Substance Use Topics   Alcohol use: Not Currently    Comment: social   Drug use: Never  [2]  Allergies Allergen Reactions   Penicillins Rash    Has patient had a PCN reaction causing immediate rash, facial/tongue/throat swelling, SOB or lightheadedness with hypotension: Unknown  Has patient had a PCN reaction causing severe rash involving mucus membranes or skin necrosis: Yes  Has patient had a PCN reaction that required hospitalization: No  Has patient had a PCN reaction occurring within the last 10 years: No  If all of the above answers are NO, then may proceed with Cephalosporin use.  Has patient had a PCN reaction causing immediate rash, facial/tongue/throat swelling, SOB or lightheadedness with hypotension: Unknown Has patient had a PCN reaction causing severe rash involving mucus membranes or skin necrosis: Yes Has patient had a PCN reaction that required hospitalization: No Has patient had a PCN reaction occurring within the last 10 years: No If all of the above answers are NO, then may proceed with Cephalosporin use.    rash   Sulfa Antibiotics Rash   Penicillamine Other (See Comments)   "

## 2024-05-07 ENCOUNTER — Other Ambulatory Visit: Payer: Self-pay

## 2024-05-07 MED ORDER — DICYCLOMINE HCL 10 MG PO CAPS: ORAL_CAPSULE | ORAL | 1 refills | Status: AC

## 2024-05-07 NOTE — Progress Notes (Signed)
 Patient requested 90 day supply of Bentyl  to be taken 2 to 3 times a day as needed per SA

## 2024-05-10 ENCOUNTER — Other Ambulatory Visit: Payer: Self-pay | Admitting: Nurse Practitioner

## 2024-05-10 DIAGNOSIS — E559 Vitamin D deficiency, unspecified: Secondary | ICD-10-CM

## 2024-05-10 NOTE — Telephone Encounter (Signed)
 Last check was 04/14/23 pt. Has an appt. In Feb, not sure if she needs to remain on this?

## 2024-05-31 ENCOUNTER — Other Ambulatory Visit: Payer: Self-pay | Admitting: Nurse Practitioner

## 2024-06-13 ENCOUNTER — Telehealth: Payer: Self-pay | Admitting: Nurse Practitioner

## 2024-06-13 ENCOUNTER — Other Ambulatory Visit: Payer: Self-pay

## 2024-06-13 MED ORDER — AMLODIPINE BESYLATE 5 MG PO TABS: ORAL_TABLET | ORAL | 0 refills | Status: AC

## 2024-06-13 NOTE — Telephone Encounter (Signed)
 Pt knocked her bottle of amlodipine  into sink yesterday and she needs new rx sent to [facility]

## 2024-06-26 ENCOUNTER — Encounter: Admitting: Gastroenterology

## 2024-06-28 ENCOUNTER — Ambulatory Visit: Admitting: Nurse Practitioner

## 2024-07-05 ENCOUNTER — Ambulatory Visit: Admitting: Nurse Practitioner

## 2024-07-29 ENCOUNTER — Encounter: Admitting: Gastroenterology
